# Patient Record
Sex: Male | Born: 1985 | Race: Black or African American | Hispanic: No | Marital: Married | Smoking: Former smoker
Health system: Southern US, Community
[De-identification: ages and names within clinical notes are randomized; demographics above are authoritative.]

## PROBLEM LIST (undated history)

## (undated) DIAGNOSIS — Z9189 Other specified personal risk factors, not elsewhere classified: Secondary | ICD-10-CM

## (undated) DIAGNOSIS — M7542 Impingement syndrome of left shoulder: Secondary | ICD-10-CM

## (undated) DIAGNOSIS — R404 Transient alteration of awareness: Secondary | ICD-10-CM

## (undated) DIAGNOSIS — G4730 Sleep apnea, unspecified: Secondary | ICD-10-CM

## (undated) DIAGNOSIS — F419 Anxiety disorder, unspecified: Secondary | ICD-10-CM

## (undated) DIAGNOSIS — Z6841 Body Mass Index (BMI) 40.0 and over, adult: Secondary | ICD-10-CM

## (undated) HISTORY — PX: TONSILLECTOMY: SUR1361

## (undated) HISTORY — DX: Transient alteration of awareness: R40.4

## (undated) HISTORY — DX: Body Mass Index (BMI) 40.0 and over, adult: Z684

## (undated) HISTORY — DX: Other specified personal risk factors, not elsewhere classified: Z91.89

## (undated) HISTORY — DX: Morbid (severe) obesity due to excess calories: E66.01

## (undated) HISTORY — PX: SHOULDER ARTHROSCOPY W/ SUBACROMIAL DECOMPRESSION AND DISTAL CLAVICLE EXCISION: SHX2401

## (undated) HISTORY — DX: Sleep apnea, unspecified: G47.30

## (undated) HISTORY — PX: WISDOM TOOTH EXTRACTION: SHX21

---

## 2004-01-06 ENCOUNTER — Emergency Department: Payer: Self-pay | Admitting: Emergency Medicine

## 2004-03-26 ENCOUNTER — Emergency Department: Payer: Self-pay | Admitting: Emergency Medicine

## 2004-04-25 ENCOUNTER — Emergency Department: Payer: Self-pay | Admitting: Emergency Medicine

## 2006-05-07 ENCOUNTER — Emergency Department: Payer: Self-pay | Admitting: Unknown Physician Specialty

## 2006-07-28 ENCOUNTER — Ambulatory Visit: Payer: Self-pay | Admitting: Family Medicine

## 2006-07-28 DIAGNOSIS — Z9189 Other specified personal risk factors, not elsewhere classified: Secondary | ICD-10-CM

## 2006-07-28 DIAGNOSIS — E66812 Obesity, class 2: Secondary | ICD-10-CM | POA: Insufficient documentation

## 2006-08-29 ENCOUNTER — Ambulatory Visit: Payer: Self-pay | Admitting: Family Medicine

## 2006-10-14 ENCOUNTER — Ambulatory Visit: Payer: Self-pay | Admitting: Unknown Physician Specialty

## 2007-06-15 ENCOUNTER — Ambulatory Visit: Payer: Self-pay | Admitting: Family Medicine

## 2007-12-28 ENCOUNTER — Ambulatory Visit: Payer: Self-pay | Admitting: Family Medicine

## 2007-12-28 LAB — CONVERTED CEMR LAB: Rapid Strep: NEGATIVE

## 2007-12-29 LAB — CONVERTED CEMR LAB
Basophils Relative: 0.6 % (ref 0.0–3.0)
Chloride: 103 meq/L (ref 96–112)
Eosinophils Relative: 4.5 % (ref 0.0–5.0)
Hemoglobin: 15.9 g/dL (ref 13.0–17.0)
Lymphocytes Relative: 36.5 % (ref 12.0–46.0)
MCHC: 33.8 g/dL (ref 30.0–36.0)
Monocytes Absolute: 0.4 10*3/uL (ref 0.1–1.0)
Monocytes Relative: 5.5 % (ref 3.0–12.0)
Neutrophils Relative %: 52.9 % (ref 43.0–77.0)
Platelets: 383 10*3/uL (ref 150–400)
Potassium: 4.3 meq/L (ref 3.5–5.1)
RDW: 12.4 % (ref 11.5–14.6)
Sodium: 141 meq/L (ref 135–145)

## 2008-08-18 ENCOUNTER — Emergency Department: Payer: Self-pay | Admitting: Emergency Medicine

## 2008-10-23 ENCOUNTER — Ambulatory Visit: Payer: Self-pay | Admitting: Family Medicine

## 2008-10-23 DIAGNOSIS — M7660 Achilles tendinitis, unspecified leg: Secondary | ICD-10-CM | POA: Insufficient documentation

## 2008-11-20 ENCOUNTER — Ambulatory Visit: Payer: Self-pay | Admitting: Family Medicine

## 2008-11-20 DIAGNOSIS — R404 Transient alteration of awareness: Secondary | ICD-10-CM

## 2008-11-26 ENCOUNTER — Ambulatory Visit: Payer: Self-pay | Admitting: Pulmonary Disease

## 2008-11-26 DIAGNOSIS — Z9189 Other specified personal risk factors, not elsewhere classified: Secondary | ICD-10-CM | POA: Insufficient documentation

## 2008-11-26 LAB — CONVERTED CEMR LAB
Albumin: 4.3 g/dL (ref 3.5–5.2)
Alkaline Phosphatase: 61 units/L (ref 39–117)
BUN: 8 mg/dL (ref 6–23)
Basophils Relative: 0.1 % (ref 0.0–3.0)
CO2: 30 meq/L (ref 19–32)
Calcium: 9.6 mg/dL (ref 8.4–10.5)
Chloride: 103 meq/L (ref 96–112)
Cholesterol: 222 mg/dL — ABNORMAL HIGH (ref 0–200)
Eosinophils Absolute: 0.1 10*3/uL (ref 0.0–0.7)
GFR calc non Af Amer: 105.16 mL/min (ref >60)
Glucose, Bld: 75 mg/dL (ref 70–99)
Hemoglobin: 15.8 g/dL (ref 13.0–17.0)
Lymphocytes Relative: 38 % (ref 12.0–46.0)
MCHC: 33.1 g/dL (ref 30.0–36.0)
MCV: 86.7 fL (ref 78.0–100.0)
Monocytes Absolute: 0.5 10*3/uL (ref 0.1–1.0)
Neutrophils Relative %: 52.1 % (ref 43.0–77.0)
Platelets: 396 10*3/uL (ref 150.0–400.0)
Total Bilirubin: 1.3 mg/dL — ABNORMAL HIGH (ref 0.3–1.2)
WBC: 6.7 10*3/uL (ref 4.5–10.5)

## 2008-12-01 DIAGNOSIS — G4733 Obstructive sleep apnea (adult) (pediatric): Secondary | ICD-10-CM

## 2008-12-01 DIAGNOSIS — Z9989 Dependence on other enabling machines and devices: Secondary | ICD-10-CM

## 2008-12-21 ENCOUNTER — Ambulatory Visit (HOSPITAL_BASED_OUTPATIENT_CLINIC_OR_DEPARTMENT_OTHER): Admission: RE | Admit: 2008-12-21 | Discharge: 2008-12-21 | Payer: Self-pay | Admitting: Pulmonary Disease

## 2008-12-21 ENCOUNTER — Encounter: Payer: Self-pay | Admitting: Pulmonary Disease

## 2008-12-25 ENCOUNTER — Ambulatory Visit: Payer: Self-pay | Admitting: Pulmonary Disease

## 2009-01-06 ENCOUNTER — Encounter: Payer: Self-pay | Admitting: Pulmonary Disease

## 2009-01-20 ENCOUNTER — Encounter: Admission: RE | Admit: 2009-01-20 | Discharge: 2009-01-20 | Payer: Self-pay | Admitting: Family Medicine

## 2009-01-20 ENCOUNTER — Ambulatory Visit: Payer: Self-pay | Admitting: Family Medicine

## 2009-01-21 ENCOUNTER — Encounter: Payer: Self-pay | Admitting: Pulmonary Disease

## 2009-02-16 ENCOUNTER — Encounter: Payer: Self-pay | Admitting: Pulmonary Disease

## 2009-02-25 ENCOUNTER — Encounter (INDEPENDENT_AMBULATORY_CARE_PROVIDER_SITE_OTHER): Payer: Self-pay | Admitting: *Deleted

## 2009-03-03 ENCOUNTER — Ambulatory Visit: Payer: Self-pay | Admitting: Pulmonary Disease

## 2010-08-12 ENCOUNTER — Encounter: Payer: Self-pay | Admitting: Family Medicine

## 2010-08-13 ENCOUNTER — Ambulatory Visit (INDEPENDENT_AMBULATORY_CARE_PROVIDER_SITE_OTHER): Payer: BC Managed Care – PPO | Admitting: Family Medicine

## 2010-08-13 ENCOUNTER — Encounter: Payer: Self-pay | Admitting: Family Medicine

## 2010-08-13 VITALS — BP 136/96 | HR 76 | Temp 97.9°F | Ht 71.5 in | Wt 321.1 lb

## 2010-08-13 DIAGNOSIS — K5900 Constipation, unspecified: Secondary | ICD-10-CM

## 2010-08-13 DIAGNOSIS — K602 Anal fissure, unspecified: Secondary | ICD-10-CM | POA: Insufficient documentation

## 2010-08-13 MED ORDER — POLYETHYLENE GLYCOL 3350 17 GM/SCOOP PO POWD: 17.0000 g | Freq: Every day | ORAL | Status: DC

## 2010-08-13 MED ORDER — NITROGLYCERIN 0.4 % RE OINT: 0.5000 [in_us] | TOPICAL_OINTMENT | Freq: Two times a day (BID) | RECTAL | Status: DC | PRN

## 2010-08-13 NOTE — Assessment & Plan Note (Signed)
See pt instructions. Attributing abd pain/bloating to constipation.

## 2010-08-13 NOTE — Assessment & Plan Note (Addendum)
R sided anal fissure.  Minimal hemorrhoid. Discussed increased fiber in diet, increased water. Stop laxative, start miralax and docusate. Will treat with nitro cream as well to expedite healing - discussed risk of hypotension and HA, to use sparingly. See pt instructions for treatment.  Return if not improving as expected. No hx or evidence of crohn's.

## 2010-08-13 NOTE — Patient Instructions (Signed)
Fiber handout provided today. Increase amount of water you are drinking. Soak in warm water bath. miralax daily for goal of one soft stool daily. May use colace daily as well to avoid constipation.  Anal Fissure An anal fissure often begins with sharp pain. This is usually following a bowel movement. It often causes bright red blood stained stools. It is the most common cause of rectal bleeding. One common cause of this is passage of a large, hard stool. It can also be caused by having frequent diarrheal stools. Anal fissures that occur for a longtime (chronic) may require surgery. CAUSES  Passing large, hard stools.   Frequent diarrheal stools.   Constipation.  SYMPTOMS  Bright red, blood stained stools.   Rectal bleeding.  HOME CARE INSTRUCTIONS  If constipation is the cause of the rectal fissure, it may be necessary to add a bulk-forming laxative. A diet high in fruits, whole grains, and vegetables will also help.   Taking hot sitz baths for 1 half hour 4 times per day may help.   Increase your fluid intake.   Only take over-the-counter or prescription medicines for pain, discomfort, or fever as directed by your caregiver. Do not take aspirin as this may increase the tendency for bleeding.   Do not use ointments containing anesthetic medications or hydrocortisone. They could slow healing.   Avoid constipating foods such as bananas and cheese.   In children, brown Karo syrup may be used by adding 1 teaspoon of syrup to 8 ounces of formula. An alternative is to give 3 teaspoons of mineral oil every day.  SEEK MEDICAL CARE IF: Rectal bleeding continues, changes in intensity, or becomes more severe. MAKE SURE YOU:   Understand these instructions.   Will watch your condition.   Will get help right away if you are not doing well or get worse.  Document Released: 03/01/2005 Document Re-Released: 05/26/2009 [facility] Patient Information 2011 [place], [place].

## 2010-08-13 NOTE — Progress Notes (Signed)
  Subjective:    Patient ID: Marguerite Jarboe, male    DOB: [birth year], 25 y.o.   MRN: [id number]  HPI CC: abd pain  Sallyanne Havers chicken last Tuesday at [employer].  Next day constipation (normally BM 2-3x/day.)  Tried laxative - normal stool.  Next day with painful hemorrhoid.  Also having upper and lower abd pain, attributed to indigestion.  Monday with blood in stool, using tucks.  Hasn't had good stool.  Pain with sitting on hemorrhoid.  + constipation.  Has BM, not completely emptying.  Stated prep H suppository yesterday.  No more blood in stool (only when wiping).  No fevers/chills, nausea, vomiting.  No diarrhea.  Has had hemorrhoids in past, last 2010, resolved with preparation H.  Low fiber diet, not drinking much water.  Review of Systems Per HPI    Objective:   Physical Exam  Nursing note and vitals reviewed. Constitutional: He appears well-developed and well-nourished. No distress.  HENT:  Head: Normocephalic and atraumatic.  Mouth/Throat: Oropharynx is clear and moist. No oropharyngeal exudate.  Cardiovascular: Normal rate, regular rhythm, normal heart sounds and intact distal pulses.   No murmur heard. Pulmonary/Chest: Effort normal and breath sounds normal. No respiratory distress. He has no wheezes. He has no rales.  Abdominal: Soft. Bowel sounds are normal. He exhibits no distension. There is tenderness (mild suprapubic). There is no rebound and no guarding.  Genitourinary:     Skin: Skin is warm and dry. No rash noted.          Assessment & Plan:

## 2010-08-14 ENCOUNTER — Telehealth: Payer: Self-pay | Admitting: *Deleted

## 2010-08-14 NOTE — Telephone Encounter (Signed)
[facility] notified as instructed by telephone. Med list updated.

## 2010-08-14 NOTE — Telephone Encounter (Signed)
Received faxed refill request from pharmacy stating that Rectiv 4% ointment is $363.  They would like to change to Nitrobid 2% for $9.  Please advise.

## 2010-08-14 NOTE — Telephone Encounter (Signed)
That is just fine -- same instructions- please change on med list Dr Reece Agar px it -- he is with me and saw the phone note and agrees with the plan thanks

## 2010-08-17 ENCOUNTER — Telehealth: Payer: Self-pay | Admitting: *Deleted

## 2010-08-17 ENCOUNTER — Other Ambulatory Visit: Payer: Self-pay | Admitting: *Deleted

## 2010-08-17 NOTE — Telephone Encounter (Signed)
Rectiv ointment too expensive. Per pharmacy it is $363.00. It can be changed to Nitrobid 2% and it will only be $9.00. Please advise

## 2010-08-17 NOTE — Telephone Encounter (Signed)
Rectiv ointment is too expensive. Per pharmacy it is $363.00. It can be changed to Nitrobid 2 % and will only be $9.00. Please advise.

## 2010-08-17 NOTE — Telephone Encounter (Signed)
Please see previous phone note.  Should have been called in on Friday.  Notify pt.  Ok to change.

## 2010-08-17 NOTE — Telephone Encounter (Signed)
Verified with pharmacy. They did receive change and patient aware.

## 2010-10-06 ENCOUNTER — Encounter: Payer: Self-pay | Admitting: Family Medicine

## 2010-10-06 ENCOUNTER — Ambulatory Visit (INDEPENDENT_AMBULATORY_CARE_PROVIDER_SITE_OTHER): Payer: BC Managed Care – PPO | Admitting: Family Medicine

## 2010-10-06 VITALS — BP 150/88 | HR 80 | Temp 99.1°F | Wt 324.0 lb

## 2010-10-06 DIAGNOSIS — J069 Acute upper respiratory infection, unspecified: Secondary | ICD-10-CM | POA: Insufficient documentation

## 2010-10-06 DIAGNOSIS — J029 Acute pharyngitis, unspecified: Secondary | ICD-10-CM

## 2010-10-06 LAB — POCT RAPID STREP A (OFFICE): Rapid Strep A Screen: NEGATIVE

## 2010-10-06 NOTE — Progress Notes (Signed)
  Subjective:    Patient ID: Jorge Stevens, male    DOB: [birth year], 25 y.o.   MRN: [id number]  HPI CC: ST  7d h/o feeling ill.  Started with sinus congestion, ST.  Cough intermittently, productive of phlegm.  Mild HA, improving.  No ear pain, tooth pain, fevers/chills, nausea/vomiting, abd pain.  No RN, sneezing, itchy eyes.  Taking alleve which helps throat, cough drops.  Taking theraflu sore throat which helped some.  Using CPAP, recently snoring worse, last night had trouble breathing.  + sick contacts recently, no smokers at home.  No h/o asthma, allergies.    H/o tonsils removed.  Thinks uvula swollen.  Review of Systems Per HPI    Objective:   Physical Exam  Nursing note and vitals reviewed. Constitutional: He appears well-developed and well-nourished. No distress.  HENT:  Head: Normocephalic and atraumatic.  Right Ear: Hearing, tympanic membrane, external ear and ear canal normal.  Left Ear: Hearing, tympanic membrane, external ear and ear canal normal.  Nose: Mucosal edema present. No rhinorrhea. Right sinus exhibits no maxillary sinus tenderness and no frontal sinus tenderness. Left sinus exhibits no maxillary sinus tenderness and no frontal sinus tenderness.  Mouth/Throat: Uvula is midline and mucous membranes are normal. Posterior oropharyngeal erythema present. No oropharyngeal exudate, posterior oropharyngeal edema or tonsillar abscesses.  Eyes: Conjunctivae and EOM are normal. Pupils are equal, round, and reactive to light. No scleral icterus.  Neck: Normal range of motion. Neck supple.  Cardiovascular: Normal rate, regular rhythm, normal heart sounds and intact distal pulses.   No murmur heard. Pulmonary/Chest: Effort normal and breath sounds normal. No respiratory distress. He has no wheezes. He has no rales.  Lymphadenopathy:    He has no cervical adenopathy.  Skin: Skin is warm and dry. No rash noted.  Psychiatric: He has a normal mood and affect.           Assessment & Plan:

## 2010-10-06 NOTE — Patient Instructions (Signed)
Sounds like you have a viral upper respiratory infection. Antibiotics are not needed for this.  Viral infections usually take 7-10 days to resolve.  The cough can last several weeks to go away. Push fluids and plenty of rest. Nasal saline or neti pot to help drain sinuses. If congestion feels stuck, may use simple mucinex with plenty of fluid to mobilize mucous out. Ibuprofen/advil for inflammation. Please return if you are not improving as expected, or if you have high fevers (>101.5) or difficulty swallowing or worsening productive cough. Call clinic with questions.  Good to see you today. Follow up with Dr. Milinda Antis for physical to check on blood pressure.

## 2010-10-06 NOTE — Assessment & Plan Note (Signed)
Low centor criteria. RST neg. Viral infection, treat supportively as per instructions. Red flags to return discussed. No cough syrup as h/o OSA.

## 2010-12-04 ENCOUNTER — Emergency Department: Payer: Self-pay | Admitting: Emergency Medicine

## 2010-12-07 ENCOUNTER — Telehealth: Payer: Self-pay | Admitting: Family Medicine

## 2010-12-07 DIAGNOSIS — Z0000 Encounter for general adult medical examination without abnormal findings: Secondary | ICD-10-CM

## 2010-12-07 NOTE — Telephone Encounter (Signed)
Message copied by Judy Pimple on Mon Dec 07, 2010  8:58 PM ------      Message from: Baldomero Lamy      Created: Mon Dec 07, 2010 10:17 AM      Regarding: cpx labs Wed 9/26       Please order  future cpx labs for pt's upcomming lab appt.      Thanks      Rodney Booze

## 2010-12-09 ENCOUNTER — Other Ambulatory Visit: Payer: BC Managed Care – PPO

## 2010-12-15 ENCOUNTER — Other Ambulatory Visit (INDEPENDENT_AMBULATORY_CARE_PROVIDER_SITE_OTHER): Payer: BC Managed Care – PPO

## 2010-12-15 DIAGNOSIS — Z0000 Encounter for general adult medical examination without abnormal findings: Secondary | ICD-10-CM

## 2010-12-15 LAB — CBC WITH DIFFERENTIAL/PLATELET
Lymphs Abs: 2.3 10*3/uL (ref 0.7–4.0)
MCHC: 33 g/dL (ref 30.0–36.0)
Monocytes Absolute: 0.4 10*3/uL (ref 0.1–1.0)
Monocytes Relative: 6.4 % (ref 3.0–12.0)
Neutrophils Relative %: 56.1 % (ref 43.0–77.0)
Platelets: 464 10*3/uL — ABNORMAL HIGH (ref 150.0–400.0)
RBC: 5.28 Mil/uL (ref 4.22–5.81)
RDW: 13.3 % (ref 11.5–14.6)
WBC: 6.6 10*3/uL (ref 4.5–10.5)

## 2010-12-15 LAB — LIPID PANEL
Cholesterol: 185 mg/dL (ref 0–200)
HDL: 39.4 mg/dL (ref >39.00)

## 2010-12-15 LAB — COMPREHENSIVE METABOLIC PANEL
AST: 25 U/L (ref 0–37)
CO2: 27 mEq/L (ref 19–32)
Calcium: 9 mg/dL (ref 8.4–10.5)
GFR: 107.98 mL/min (ref >60.00)
Potassium: 3.7 mEq/L (ref 3.5–5.1)
Sodium: 140 mEq/L (ref 135–145)
Total Bilirubin: 1.2 mg/dL (ref 0.3–1.2)

## 2010-12-15 LAB — TSH: TSH: 0.82 u[IU]/mL (ref 0.35–5.50)

## 2010-12-16 ENCOUNTER — Ambulatory Visit (INDEPENDENT_AMBULATORY_CARE_PROVIDER_SITE_OTHER): Payer: BC Managed Care – PPO | Admitting: Family Medicine

## 2010-12-16 ENCOUNTER — Encounter: Payer: Self-pay | Admitting: Family Medicine

## 2010-12-16 VITALS — BP 142/70 | HR 84 | Temp 98.1°F | Ht 71.5 in | Wt 327.2 lb

## 2010-12-16 DIAGNOSIS — I10 Essential (primary) hypertension: Secondary | ICD-10-CM | POA: Insufficient documentation

## 2010-12-16 DIAGNOSIS — Z0000 Encounter for general adult medical examination without abnormal findings: Secondary | ICD-10-CM

## 2010-12-16 DIAGNOSIS — G4730 Sleep apnea, unspecified: Secondary | ICD-10-CM

## 2010-12-16 DIAGNOSIS — R030 Elevated blood-pressure reading, without diagnosis of hypertension: Secondary | ICD-10-CM

## 2010-12-16 DIAGNOSIS — Z23 Encounter for immunization: Secondary | ICD-10-CM

## 2010-12-16 DIAGNOSIS — K5900 Constipation, unspecified: Secondary | ICD-10-CM

## 2010-12-16 NOTE — Assessment & Plan Note (Signed)
Overall doing well with cpap- and compliant with that

## 2010-12-16 NOTE — Assessment & Plan Note (Addendum)
Elevated systolic today - not as high as last time  Stressed imp of getting back to regular exercise and working on wt loss Put together a plan- will consider wt watchers on line  F/u 6 mo  If not imp consider tx

## 2010-12-16 NOTE — Assessment & Plan Note (Signed)
Reviewed health habits including diet and exercise and skin cancer prevention Also reviewed health mt list, fam hx and immunizations  Rev wellness labs - commended on better chol with imp diet Flu shot today

## 2010-12-16 NOTE — Assessment & Plan Note (Signed)
Better controlled with diet/ miralax when needed No reocurrance of anal fissure

## 2010-12-16 NOTE — Progress Notes (Signed)
Subjective:    Patient ID: Jorge Stevens, male    DOB: 02-14-1986, 25 y.o.   MRN: [id number]  HPI Here for annual health mt exam and to review chronic med problems and also disc bp  Is feeling ok overall  Nothing new medically   bp is a bit high systolic with 142/70 (much higher last time 150/88)  Has high bp in family  No cp or ha  Has sleep apnea on cpap-  Is still using it  Makes him feel much better    Flu shot wants to get today Td 2010  Wt is up 3 lb with bmi of 45 Knows wt loss is imp to health  Lipids have improved with LDL 119 and dec trig Lab Results  Component Value Date   CHOL 185 12/15/2010   CHOL 222* 11/20/2008   Lab Results  Component Value Date   HDL 39.40 12/15/2010   HDL 91.47* 11/20/2008   Lab Results  Component Value Date   LDLCALC 119* 12/15/2010   Lab Results  Component Value Date   TRIG 134.0 12/15/2010   TRIG 223.0* 11/20/2008   Lab Results  Component Value Date   CHOLHDL 5 12/15/2010   CHOLHDL 7 11/20/2008   Lab Results  Component Value Date   LDLDIRECT 167.8 11/20/2008   HDL is almost 40  Diet - has become a lot better Eating out much less and much less fried foods  Getting away from fast food too   Activity was exercising for a while - then got sick and got off track with it  Was doing cardio / weights - and feels ready to get back to it  Has hungrier days than others   Working from home - for [employer]   Patient Active Problem List  Diagnoses  . OBESITY  . ACHILLES TENDINITIS  . OBSTRUCTIVE SLEEP APNEA  . SNORING, HX OF  . WISDOM TEETH EXTRACTION, HX OF  . Anal fissure  . Constipation  . Routine general medical examination at a health care facility  . Elevated blood pressure (not hypertension)   Past Medical History  Diagnosis Date  . Obesity   . Achilles bursitis or tendinitis   . Unspecified constipation   . Unspecified sleep apnea   . Other specified personal history presenting hazards to health   . Other  alteration of consciousness    Past Surgical History  Procedure Date  . Wisdom tooth extraction   . Tonsillectomy   . Shoulder surgery     Left   History  Substance Use Topics  . Smoking status: Former Smoker    Quit date: 03/16/2007  . Smokeless tobacco: Not on file  . Alcohol Use: Yes     Occasional   Family History  Problem Relation Age of Onset  . Hyperlipidemia Father   . Hyperlipidemia Mother   . Diabetes      grandparents  . Throat cancer      grandmother  . Lung cancer      grandfather   No Known Allergies Current Outpatient Prescriptions on File Prior to Visit  Medication Sig Dispense Refill  . ibuprofen (ADVIL,MOTRIN) 200 MG tablet Take 200 mg by mouth every 6 (six) hours as needed.        . NON FORMULARY CPAP 12 cm, full face mask       . bismuth subsalicylate (PEPTO BISMOL) 262 MG/15ML suspension Take 15 mLs by mouth every 6 (six) hours as needed.        Marland Kitchen  nitroGLYCERIN (NITRO-BID) 2 % ointment Place 0.5 inches rectally 2 (two) times daily as needed. Small ribbon      . phenylephrine-shark liver oil-mineral oil-petrolatum (PREPARATION H) 0.25-3-14-71.9 % rectal ointment Place 1 application rectally 2 (two) times daily as needed.        . polyethylene glycol powder (GLYCOLAX/MIRALAX) powder Take 17 g by mouth daily. Mixed in 8 oz fliud  527 g  0  . simethicone (MYLICON) 125 MG chewable tablet Chew 125 mg by mouth every 6 (six) hours as needed.               Review of Systems Review of Systems  Constitutional: Negative for fever, appetite change, fatigue and unexpected weight change.  Eyes: Negative for pain and visual disturbance.  Respiratory: Negative for cough and shortness of breath.   Cardiovascular: Negative for cp or palpitations    Gastrointestinal: Negative for nausea, diarrhea and constipation.  Genitourinary: Negative for urgency and frequency.  Skin: Negative for pallor or rash   Neurological: Negative for weakness, light-headedness,  numbness and headaches.  Hematological: Negative for adenopathy. Does not bruise/bleed easily.  Psychiatric/Behavioral: Negative for dysphoric mood. The patient is not nervous/anxious.          Objective:   Physical Exam  Constitutional: He appears well-developed and well-nourished. No distress.       Obese and well appearing  HENT:  Head: Normocephalic and atraumatic.  Right Ear: External ear normal.  Left Ear: External ear normal.  Nose: Nose normal.  Mouth/Throat: Oropharynx is clear and moist.  Eyes: Conjunctivae and EOM are normal. Pupils are equal, round, and reactive to light. No scleral icterus.  Neck: Normal range of motion. Neck supple. No JVD present. Carotid bruit is not present. No thyromegaly present.  Cardiovascular: Normal rate, regular rhythm, normal heart sounds and intact distal pulses.  Exam reveals no gallop.   Pulmonary/Chest: Effort normal and breath sounds normal. No respiratory distress. He has no wheezes. He exhibits no tenderness.  Abdominal: Soft. Bowel sounds are normal. He exhibits no distension, no abdominal bruit and no mass. There is no tenderness.  Musculoskeletal: Normal range of motion. He exhibits no edema and no tenderness.  Lymphadenopathy:    He has no cervical adenopathy.  Neurological: He is alert. He has normal reflexes. No cranial nerve deficit. He exhibits normal muscle tone. Coordination normal.       No tremor   Skin: Skin is warm and dry. No rash noted. No erythema. No pallor.  Psychiatric: He has a normal mood and affect.          Assessment & Plan:

## 2010-12-16 NOTE — Patient Instructions (Signed)
Keep working on [employer] - avoid junk food and watch portions Consider weight watchers for men  Flu shot today Exercise - aim for 5 days per week for at least 30 minutes when you are feeling better  Follow up in 6 months for visit and will look at your blood pressure again

## 2011-06-02 ENCOUNTER — Institutional Professional Consult (permissible substitution): Payer: BC Managed Care – PPO | Admitting: Family Medicine

## 2011-06-02 DIAGNOSIS — Z0289 Encounter for other administrative examinations: Secondary | ICD-10-CM

## 2011-06-15 ENCOUNTER — Ambulatory Visit: Payer: BC Managed Care – PPO | Admitting: Family Medicine

## 2011-06-15 DIAGNOSIS — Z0289 Encounter for other administrative examinations: Secondary | ICD-10-CM

## 2011-07-07 ENCOUNTER — Ambulatory Visit: Payer: BC Managed Care – PPO | Admitting: Family Medicine

## 2011-07-14 ENCOUNTER — Encounter: Payer: Self-pay | Admitting: Family Medicine

## 2011-07-14 ENCOUNTER — Ambulatory Visit (INDEPENDENT_AMBULATORY_CARE_PROVIDER_SITE_OTHER): Payer: 59 | Admitting: Family Medicine

## 2011-07-14 VITALS — BP 134/90 | HR 87 | Temp 98.0°F | Ht 71.5 in | Wt 327.8 lb

## 2011-07-14 DIAGNOSIS — J069 Acute upper respiratory infection, unspecified: Secondary | ICD-10-CM | POA: Insufficient documentation

## 2011-07-14 DIAGNOSIS — E669 Obesity, unspecified: Secondary | ICD-10-CM

## 2011-07-14 DIAGNOSIS — G4730 Sleep apnea, unspecified: Secondary | ICD-10-CM

## 2011-07-14 DIAGNOSIS — R030 Elevated blood-pressure reading, without diagnosis of hypertension: Secondary | ICD-10-CM

## 2011-07-14 MED ORDER — AMLODIPINE BESYLATE 5 MG PO TABS: 5.0000 mg | ORAL_TABLET | Freq: Every day | ORAL | Status: DC

## 2011-07-14 NOTE — Assessment & Plan Note (Signed)
No imp with lifestyle change Understands he needs to loose wt  Start amlodipine 5 Disc poss side eff F/u 6-8 wk

## 2011-07-14 NOTE — Assessment & Plan Note (Signed)
With ongoing cong and cough- s/p 2 abx from UC  Got better less than a week then worse Some mild sinus tenderness Will call for UC notes ? New uri or sinusitis Pt claims no hx of allergies  Disc symptomatic care - see instructions on AVS

## 2011-07-14 NOTE — Assessment & Plan Note (Signed)
cpap has helped - but snoring again so will call and make his own appt for f/u with pulm

## 2011-07-14 NOTE — Assessment & Plan Note (Signed)
Discussed how this problem influences overall health and the risks it imposes  Reviewed plan for weight loss with lower calorie diet (via better food choices and also portion control or program like weight watchers) and exercise building up to or more than 30 minutes 5 days per week including some aerobic activity   Good effort so far- need to cut portions

## 2011-07-14 NOTE — Progress Notes (Signed)
Subjective:    Patient ID: Jorge Stevens, male    DOB: 1985/06/21, 26 y.o.   MRN: [id number]  HPI Here for f/u of chronic conditions Taking better care of himself  Also uri   Wt is stable  bmi is 45 Diet- better - no more fried or fatty foods  Exercise-- does walking / cardio and walking -- at least 2 times per week  Cut back when you were sick -- has been to urgent care twice [facility] -- has been coming back - ST   [facility]- brown/ yellow d/c (former smoker)  ST  No fever - some sweats  Pain in face around eyes  Coughing - took ny quil  Worse in ams - and sleeping with mouth open - makes cpap difficult  Finished last abs 4-5 d ago  Also was on prednisone   Hx of elevated bp - not checking at home  BP Readings from Last 3 Encounters:  07/14/11 134/90  12/16/10 142/70  10/06/10 150/88      Chemistry      Component Value Date/Time   NA 140 12/15/2010 0859   K 3.7 12/15/2010 0859   CL 106 12/15/2010 0859   CO2 27 12/15/2010 0859   BUN 9 12/15/2010 0859   CREATININE 1.1 12/15/2010 0859      Component Value Date/Time   CALCIUM 9.0 12/15/2010 0859   ALKPHOS 49 12/15/2010 0859   AST 25 12/15/2010 0859   ALT 48 12/15/2010 0859   BILITOT 1.2 12/15/2010 0859      Sleep apnea -cpap has been working , but was snoring again- needs to follow up  Needs phone number   Patient Active Problem List  Diagnoses  . OBESITY  . OBSTRUCTIVE SLEEP APNEA  . SNORING, HX OF  . WISDOM TEETH EXTRACTION, HX OF  . Anal fissure  . Constipation  . Routine general medical examination at a health care facility  . Hypertension, essential  . Acute URI   Past Medical History  Diagnosis Date  . Obesity   . Achilles bursitis or tendinitis   . Unspecified constipation   . Unspecified sleep apnea   . Other specified personal history presenting hazards to health   . Other alteration of consciousness    Past Surgical History  Procedure Date  . Wisdom tooth extraction   . Tonsillectomy   . Shoulder  surgery     Left   History  Substance Use Topics  . Smoking status: Former Smoker    Quit date: 03/16/2007  . Smokeless tobacco: Not on file  . Alcohol Use: Yes     Occasional   Family History  Problem Relation Age of Onset  . Hyperlipidemia Father   . Hyperlipidemia Mother   . Diabetes      grandparents  . Throat cancer      grandmother  . Lung cancer      grandfather   No Known Allergies Current Outpatient Prescriptions on File Prior to Visit  Medication Sig Dispense Refill  . ibuprofen (ADVIL,MOTRIN) 200 MG tablet Take 200 mg by mouth every 6 (six) hours as needed.        . NON FORMULARY CPAP 12 cm, full face mask       . amLODipine (NORVASC) 5 MG tablet Take 1 tablet (5 mg total) by mouth daily.  30 tablet  3  . phenylephrine-shark liver oil-mineral oil-petrolatum (PREPARATION H) 0.25-3-14-71.9 % rectal ointment Place 1 application rectally 2 (two) times daily as needed.  Review of Systems Review of Systems  Constitutional: Negative for fever, appetite change,  and unexpected weight change.  Eyes: Negative for pain and visual disturbance.  ENT pos for congestion/ rhinorrhea/ yellow discharge/ st Respiratory: Negative for sob and wheeze/ pos for cough Cardiovascular: Negative for cp or palpitations    Gastrointestinal: Negative for nausea, diarrhea and constipation.  Genitourinary: Negative for urgency and frequency.  Skin: Negative for pallor or rash   Neurological: Negative for weakness, light-headedness, numbness and headaches.  Hematological: Negative for adenopathy. Does not bruise/bleed easily.  Psychiatric/Behavioral: Negative for dysphoric mood. The patient is not nervous/anxious.         Objective:   Physical Exam  Constitutional: He appears well-developed and well-nourished. No distress.       Obese and well appearing   HENT:  Head: Normocephalic and atraumatic.  Right Ear: External ear normal.  Left Ear: External ear normal.    Mouth/Throat: Oropharynx is clear and moist. No oropharyngeal exudate.       Nares are injected and congested   Mild maxillary sinus tenderness Clear rhinorrhea with post nasal drip   Eyes: Conjunctivae and EOM are normal. Pupils are equal, round, and reactive to light. Right eye exhibits no discharge. Left eye exhibits no discharge.  Neck: Normal range of motion. Neck supple. No JVD present. Carotid bruit is not present. No thyromegaly present.  Cardiovascular: Normal rate, regular rhythm, normal heart sounds and intact distal pulses.   Pulmonary/Chest: Effort normal and breath sounds normal. No respiratory distress. He has no wheezes.  Abdominal: Soft. Bowel sounds are normal. He exhibits no distension, no abdominal bruit and no mass. There is no tenderness.  Musculoskeletal: Normal range of motion. He exhibits no edema and no tenderness.  Lymphadenopathy:    He has no cervical adenopathy.  Neurological: He is alert. He has normal reflexes. No cranial nerve deficit. He exhibits normal muscle tone. Coordination normal.  Skin: Skin is warm and dry. No rash noted. No erythema. No pallor.  Psychiatric: He has a normal mood and affect.          Assessment & Plan:

## 2011-07-14 NOTE — Patient Instructions (Addendum)
Take robitussin DM for cough Drink lots of fluids  Use nasal saline spray for congestion Please send for notes from [facility] on [address]- so I can review Please give patient the number to [facility] Make your follow up appt for sleep apnea Keep working hard on weight loss  Start amlodipine 5 mg once daily -- update me if problems or side effects Follow up with me in 6-8 weeks

## 2011-09-01 ENCOUNTER — Ambulatory Visit: Payer: 59 | Admitting: Family Medicine

## 2011-09-01 DIAGNOSIS — Z0289 Encounter for other administrative examinations: Secondary | ICD-10-CM

## 2011-09-28 ENCOUNTER — Ambulatory Visit (INDEPENDENT_AMBULATORY_CARE_PROVIDER_SITE_OTHER): Payer: 59 | Admitting: Pulmonary Disease

## 2011-09-28 ENCOUNTER — Encounter: Payer: Self-pay | Admitting: Pulmonary Disease

## 2011-09-28 VITALS — BP 124/78 | HR 77 | Temp 98.9°F | Ht 73.0 in | Wt 337.0 lb

## 2011-09-28 DIAGNOSIS — G4730 Sleep apnea, unspecified: Secondary | ICD-10-CM

## 2011-09-28 NOTE — Patient Instructions (Signed)
We will check download & decide if pressure needs to be adjusted CPAP supplies- including nasal mask OK to change DME

## 2011-09-28 NOTE — Assessment & Plan Note (Signed)
We will check download & decide if pressure needs to be adjusted CPAP supplies- including nasal mask instead of full face, if needed  - he will try chin strap OK to change DME Weight loss encouraged, compliance with goal of at least 4-6 hrs every night is the expectation. Advised against medications with sedative side effects Cautioned against driving when sleepy - understanding that sleepiness will vary on a day to day basis

## 2011-09-28 NOTE — Progress Notes (Signed)
  Subjective:    Patient ID: Jorge Stevens, male    DOB: 02/20/1986, 26 y.o.   MRN: [id number]  HPI 22/M, morbidly obese with obstructive sleep apnea for post CPAP FU.  Snoring is loud & has not improved after tonsillectomy in 2008. He has always been a night owl since his teenage years, bedtime is way past midnight, nocturia +, wakes up after 11 am feeling unrefreshed with a dry mouth, no morning headaches.  discussed PSG >> mod obstructive sleep apnea >> started CPAP 10 cm, medium quattro mask, goal 12 cm  March 03, 2009  download 10/18- 12/5 >> no residual events, good compliance on 10 cm. >> increased to 12 cm  09/28/2011 Wife reports some snoring on 12 cm, pressure OK, feels rested in am but sleepy again as the days goes by. Wife reports one gasping episode He has gained 10 lbs over last 3 mnths, sedentary life Humidity setting at 3 Full face Mask ok - but wonders if he can use nasal mask  Review of Systems Patient denies significant dyspnea,cough, hemoptysis,  chest pain, palpitations, pedal edema, orthopnea, paroxysmal nocturnal dyspnea, lightheadedness, nausea, vomiting, abdominal or  leg pains      Objective:   Physical Exam  Gen. Pleasant, obese, in no distress ENT - no lesions, no post nasal drip Neck: No JVD, no thyromegaly, no carotid bruits Lungs: no use of accessory muscles, no dullness to percussion, decreased without rales or rhonchi  Cardiovascular: Rhythm regular, heart sounds  normal, no murmurs or gallops, no peripheral edema Musculoskeletal: No deformities, no cyanosis or clubbing , no tremors        Assessment & Plan:

## 2011-11-17 ENCOUNTER — Ambulatory Visit (INDEPENDENT_AMBULATORY_CARE_PROVIDER_SITE_OTHER): Payer: 59 | Admitting: Family Medicine

## 2011-11-17 ENCOUNTER — Encounter: Payer: Self-pay | Admitting: Family Medicine

## 2011-11-17 VITALS — BP 128/76 | HR 83 | Temp 98.4°F | Ht 73.0 in | Wt 338.5 lb

## 2011-11-17 DIAGNOSIS — R519 Headache, unspecified: Secondary | ICD-10-CM | POA: Insufficient documentation

## 2011-11-17 DIAGNOSIS — R51 Headache: Secondary | ICD-10-CM

## 2011-11-17 DIAGNOSIS — I10 Essential (primary) hypertension: Secondary | ICD-10-CM

## 2011-11-17 MED ORDER — AMLODIPINE BESYLATE 10 MG PO TABS: 10.0000 mg | ORAL_TABLET | Freq: Every day | ORAL | Status: DC

## 2011-11-17 NOTE — Assessment & Plan Note (Signed)
bp is fairly controlled, but since ha are returning - will tx more agressively with inc in amlodipine to 10 mg  Update if not starting to improve in a week or if worsening   F/U in 4-6 wk

## 2011-11-17 NOTE — Progress Notes (Signed)
Subjective:    Patient ID: Jorge Stevens, male    DOB: 11-11-1985, 26 y.o.   MRN: [id number]  HPI Here for 2 weeks of headaches  Feels very sharp - like ice pick-- in between his eyes  In the past this occurred when not taking his bp med  (last mo) Now back on the amlodipine for 1 month- and headaches came back   Pain is constant  Had to leave work yesterday  No nausea  Phonophobia - pos  No photophobia  No sinus symptoms or congestion  otc - took some ibuprofen , then later tylenol pm  The nsaid helped some (800 mg at a time)   Not dehydrated  Does not drink alcohol often  No smoking or chewing tab   fam hx - brother has had headache  Uses his cpap every night- working well  Only gets 4-5 hours of sleep a day anyway Diet ok  1 cup coffee in am  Works from home -no exposures  Patient Active Problem List  Diagnosis  . OBESITY  . OBSTRUCTIVE SLEEP APNEA  . SNORING, HX OF  . WISDOM TEETH EXTRACTION, HX OF  . Anal fissure  . Constipation  . Routine general medical examination at a health care facility  . Hypertension, essential  . Acute URI   Past Medical History  Diagnosis Date  . Obesity   . Achilles bursitis or tendinitis   . Unspecified constipation   . Unspecified sleep apnea   . Other specified personal history presenting hazards to health   . Other alteration of consciousness    Past Surgical History  Procedure Date  . Wisdom tooth extraction   . Tonsillectomy   . Shoulder surgery     Left   History  Substance Use Topics  . Smoking status: Former Smoker -- 1.5 packs/day for 6 years    Types: Cigarettes    Quit date: 03/16/2007  . Smokeless tobacco: Not on file  . Alcohol Use: Yes     Occasional   Family History  Problem Relation Age of Onset  . Hyperlipidemia Father   . Hyperlipidemia Mother   . Diabetes      grandparents  . Throat cancer      grandmother  . Lung cancer      grandfather   No Known Allergies Current Outpatient  Prescriptions on File Prior to Visit  Medication Sig Dispense Refill  . amLODipine (NORVASC) 5 MG tablet Take 1 tablet (5 mg total) by mouth daily.  30 tablet  3  . ibuprofen (ADVIL,MOTRIN) 200 MG tablet Take 200 mg by mouth every 6 (six) hours as needed.        . NON FORMULARY CPAP 12 cm, full face mask       . phenylephrine-shark liver oil-mineral oil-petrolatum (PREPARATION H) 0.25-3-14-71.9 % rectal ointment Place 1 application rectally 2 (two) times daily as needed.            Review of Systems Review of Systems  Constitutional: Negative for fever, appetite change, fatigue and unexpected weight change.  Eyes: Negative for pain and visual disturbance.  Respiratory: Negative for cough and shortness of breath.   Cardiovascular: Negative for cp or palpitations    Gastrointestinal: Negative for nausea, diarrhea and constipation.  Genitourinary: Negative for urgency and frequency.  Skin: Negative for pallor or rash   Neurological: Negative for weakness, light-headedness, numbness and pos for headaches.  Hematological: Negative for adenopathy. Does not bruise/bleed easily.  Psychiatric/Behavioral: Negative for  dysphoric mood. The patient is not nervous/anxious.         Objective:   Physical Exam  Constitutional: He appears well-developed and well-nourished. No distress.       obese and well appearing   HENT:  Head: Normocephalic and atraumatic.  Right Ear: External ear normal.  Left Ear: External ear normal.  Nose: Nose normal.  Mouth/Throat: Oropharynx is clear and moist.       No facial tenderness  Eyes: Conjunctivae and EOM are normal. Pupils are equal, round, and reactive to light. Right eye exhibits no discharge. Left eye exhibits no discharge. No scleral icterus.  Neck: Normal range of motion. Neck supple. No thyromegaly present.  Cardiovascular: Normal rate, regular rhythm, normal heart sounds and intact distal pulses.  Exam reveals no gallop.   Pulmonary/Chest: Effort  normal and breath sounds normal. No respiratory distress. He has no wheezes.  Abdominal: Soft. Bowel sounds are normal.  Musculoskeletal: He exhibits no edema and no tenderness.  Lymphadenopathy:    He has no cervical adenopathy.  Neurological: He is alert. He has normal strength and normal reflexes. He displays no atrophy and no tremor. No cranial nerve deficit or sensory deficit. He exhibits normal muscle tone. He displays a negative Romberg sign. Coordination and gait normal.       No focal cerebellar signs  Skin: Skin is warm and dry. No rash noted. No erythema. No pallor.  Psychiatric: He has a normal mood and affect.          Assessment & Plan:

## 2011-11-17 NOTE — Assessment & Plan Note (Signed)
Suspect multifactorial- stress/poor sleep hours / caffeine/ lack of exercise Handout given on lifestyle Also bp played a role in past Inc amlodipine to 10 mg - will call if side eff Update if not starting to improve in a week or if worsening   Reassuring neuro exam today

## 2011-11-17 NOTE — Patient Instructions (Addendum)
Increase your amlodipine to 2 pills once daily (that is 10 mg once daily )  If side effects or problems let me know Try to get more sleep  Work on cutting caffeine Drink lots of fluids  Use ibuprofen when you really need it for head ache  Follow up with me in 4-6 weeks

## 2011-12-15 ENCOUNTER — Ambulatory Visit: Payer: 59 | Admitting: Family Medicine

## 2011-12-15 DIAGNOSIS — Z0289 Encounter for other administrative examinations: Secondary | ICD-10-CM

## 2012-01-13 ENCOUNTER — Ambulatory Visit: Payer: 59 | Admitting: Family Medicine

## 2012-01-13 DIAGNOSIS — Z0289 Encounter for other administrative examinations: Secondary | ICD-10-CM

## 2012-01-20 ENCOUNTER — Ambulatory Visit (INDEPENDENT_AMBULATORY_CARE_PROVIDER_SITE_OTHER): Payer: 59 | Admitting: Family Medicine

## 2012-01-20 ENCOUNTER — Encounter: Payer: Self-pay | Admitting: Family Medicine

## 2012-01-20 VITALS — BP 142/78 | HR 92 | Temp 98.9°F | Wt 338.0 lb

## 2012-01-20 DIAGNOSIS — M25519 Pain in unspecified shoulder: Secondary | ICD-10-CM

## 2012-01-20 MED ORDER — TRAMADOL HCL 50 MG PO TABS: 50.0000 mg | ORAL_TABLET | Freq: Three times a day (TID) | ORAL | Status: DC | PRN

## 2012-01-20 MED ORDER — ACETAMINOPHEN 500 MG PO TABS: 500.0000 mg | ORAL_TABLET | Freq: Three times a day (TID) | ORAL | Status: DC | PRN

## 2012-01-20 MED ORDER — MELOXICAM 15 MG PO TABS: 15.0000 mg | ORAL_TABLET | Freq: Every day | ORAL | Status: DC

## 2012-01-20 NOTE — Assessment & Plan Note (Signed)
Change to mobic/tramadol/tylenol.  Gi and sedation caution.  Likely excess muscle strength on anterior chest pulling scap forward.  Needs to strengthen back, refer to ortho for consideration of injection and/or PT.  Anatomy d/w pt.  Will work on shoulder shrugs and pendulum exercises in meantime.  He understood. Goal to avoid frozen shoulder.  Defer imaging to ortho.

## 2012-01-20 NOTE — Patient Instructions (Addendum)
Work on shoulder shrugs and arms swings as we discussed.  Take tramadol/tylenol/mobic for pain.  See Shirlee Limerick about your referral before you leave today.

## 2012-01-20 NOTE — Progress Notes (Signed)
H/o L shoulder surgery, s/p spur resection ~2006.  Now with L>R shoulder pain.  Worse in the last few weeks. Taking ibuprofen and tylenol w/o relief.  "Hot feeling, jabbing in the joint."  No recent injury.  He started back working out a few months ago, but then he cut back.  Pain with movement above his head, reaching for back pocket.  Grip is still normal.    Was prev doing heavy weight lifting.   Meds, vitals, and allergies reviewed.   ROS: See HPI.  Otherwise, noncontributory.  nad ncat Neck supple, normal ROM Normal ROM R shoulder with minimal impingement sx L shoulder pain on AC testing, int and ext rotation.  Supraspinatus weak on testing.  + impingement.  Distally nv intact scap assist pos on L shoulder

## 2012-03-01 ENCOUNTER — Ambulatory Visit: Payer: 59 | Admitting: Family Medicine

## 2012-03-02 ENCOUNTER — Ambulatory Visit: Payer: 59 | Admitting: Family Medicine

## 2012-03-02 ENCOUNTER — Telehealth: Payer: Self-pay

## 2012-03-02 NOTE — Telephone Encounter (Signed)
Pt has severe S/T with fever and head congestion. Pt has appt today with Dr Para March at 4 pm.

## 2012-03-10 ENCOUNTER — Other Ambulatory Visit: Payer: Self-pay | Admitting: Family Medicine

## 2012-03-13 NOTE — Telephone Encounter (Signed)
Electronic refill request.  Please advise. 

## 2012-03-13 NOTE — Telephone Encounter (Signed)
done

## 2012-03-13 NOTE — Telephone Encounter (Signed)
He can have 3 months of refils, thanks

## 2012-03-21 ENCOUNTER — Telehealth: Payer: Self-pay | Admitting: Pulmonary Disease

## 2012-03-21 DIAGNOSIS — G4733 Obstructive sleep apnea (adult) (pediatric): Secondary | ICD-10-CM

## 2012-03-21 NOTE — Telephone Encounter (Signed)
LMOMTCB

## 2012-03-22 NOTE — Telephone Encounter (Signed)
Pt last seen by RA in July 2013 and states that he never heard anything from [facility] regarding the download. He did receive new supplies and a new mask. Will resend order to [facility] so they can obtain the download on current settings and fax results to RA.  Will forward msg to RA so he is aware.

## 2012-03-27 NOTE — Telephone Encounter (Signed)
ok 

## 2012-04-05 ENCOUNTER — Encounter (HOSPITAL_COMMUNITY): Payer: Self-pay | Admitting: *Deleted

## 2012-04-05 ENCOUNTER — Telehealth: Payer: Self-pay | Admitting: Family Medicine

## 2012-04-05 ENCOUNTER — Emergency Department (INDEPENDENT_AMBULATORY_CARE_PROVIDER_SITE_OTHER)
Admission: EM | Admit: 2012-04-05 | Discharge: 2012-04-05 | Disposition: A | Discharge status: Home / Self Care | Payer: 59 | Source: Home / Self Care | Attending: Emergency Medicine | Admitting: Emergency Medicine

## 2012-04-05 DIAGNOSIS — K5289 Other specified noninfective gastroenteritis and colitis: Secondary | ICD-10-CM

## 2012-04-05 DIAGNOSIS — K529 Noninfective gastroenteritis and colitis, unspecified: Secondary | ICD-10-CM

## 2012-04-05 MED ORDER — ONDANSETRON 4 MG PO TBDP
ORAL_TABLET | ORAL | Status: AC
  Filled 2012-04-05: qty 2

## 2012-04-05 MED ORDER — ORALYTE PO SOLN: ORAL | Status: DC

## 2012-04-05 MED ORDER — DIPHENOXYLATE-ATROPINE 2.5-0.025 MG PO TABS: 1.0000 | ORAL_TABLET | Freq: Four times a day (QID) | ORAL | Status: DC | PRN

## 2012-04-05 MED ORDER — ONDANSETRON HCL 4 MG PO TABS: 4.0000 mg | ORAL_TABLET | Freq: Three times a day (TID) | ORAL | Status: DC | PRN

## 2012-04-05 MED ORDER — ONDANSETRON 4 MG PO TBDP
8.0000 mg | ORAL_TABLET | Freq: Once | ORAL | Status: AC
  Administered 2012-04-05: 8 mg via ORAL

## 2012-04-05 NOTE — Telephone Encounter (Signed)
Patient Information:  Caller Name: Gala Murdoch  Phone: [phone]  Patient: Jorge Stevens, Jorge Stevens  Gender: Male  DOB: 09-25-1985  Age: 27 Years  PCP: [facility] ([facility])  Office Follow Up:  Does the office need to follow up with this patient?: No  Instructions For The Office: N/A  RN Note:  Has symptoms of GI virus with vomiting and diarrhea that began at 2200 04/04/12. Last emesis at 0300.  Continues to have mild diarrhea with 4-5 stools since 2200 04/04/12. Abdominal pain present even after BM. Too late to schedule appointment in office so advised to go to [facility].  Symptoms  Reason For Call & Symptoms: Shaking chills.  Reviewed Health History In EMR: Yes  Reviewed Medications In EMR: Yes  Reviewed Allergies In EMR: Yes  Reviewed Surgeries / Procedures: Yes  Date of Onset of Symptoms: 04/05/2012  Any Fever: Yes  Fever Taken: Forehead  Fever Time Of Reading: 16:40:00  Fever Last Reading: 101.9  Guideline(s) Used:  Diarrhea  Disposition Per Guideline:   See Today in Office  Reason For Disposition Reached:   Abdominal pain (Exception: pain clears completely with each passage of diarrhea stool)  Advice Given:  N/A  RN Overrode Recommendation:  Go To [facility]  Too late for appointment in office.

## 2012-04-05 NOTE — ED Provider Notes (Addendum)
History     CSN: [id number]  Arrival date & time 04/05/12  1736   First MD Initiated Contact with Patient 04/05/12 1754      Chief Complaint  Patient presents with  . Nausea  . Vomiting  . Diarrhea    (Consider location/radiation/quality/duration/timing/severity/associated sxs/prior treatment) HPI Comments: Patient presents this evening complaining of nausea vomiting and diarrhea this started last night.wife and son also with gastrointestinal symptoms. Child is doing much better today but was the first 1 to get sick. He has vomited around 3-4 times and have had about 6-7 episodes of liquidy diarrhea. No blood in stools. On and off cramping pains. No respiratory symptoms such as cough, shortness of breath or nasal congestion.  Patient is a 27 y.o. male presenting with diarrhea. The history is provided by the patient.  Diarrhea The primary symptoms include fever, nausea, vomiting and diarrhea. Primary symptoms do not include melena, hematemesis, jaundice, dysuria, myalgias, arthralgias or rash.  The illness is also significant for chills and anorexia. The illness does not include dysphagia, tenesmus or back pain. Associated medical issues do not include gallstones, bowel resection or irritable bowel syndrome.    Past Medical History  Diagnosis Date  . Obesity   . Achilles bursitis or tendinitis   . Unspecified constipation   . Unspecified sleep apnea   . Other specified personal history presenting hazards to health   . Other alteration of consciousness     Past Surgical History  Procedure Date  . Wisdom tooth extraction   . Tonsillectomy   . Shoulder surgery     Left    Family History  Problem Relation Age of Onset  . Hyperlipidemia Father   . Hyperlipidemia Mother   . Diabetes      grandparents  . Throat cancer      grandmother  . Lung cancer      grandfather    History  Substance Use Topics  . Smoking status: Former Smoker -- 1.5 packs/day for 6 years   Types: Cigarettes    Quit date: 03/16/2007  . Smokeless tobacco: Not on file  . Alcohol Use: Yes     Comment: Occasional      Review of Systems  Constitutional: Positive for fever, chills, activity change and appetite change. Negative for diaphoresis and unexpected weight change.  HENT: Negative for neck stiffness.   Respiratory: Negative for cough and shortness of breath.   Cardiovascular: Negative for chest pain and leg swelling.  Gastrointestinal: Positive for nausea, vomiting, diarrhea and anorexia. Negative for dysphagia, melena, hematemesis and jaundice.  Genitourinary: Negative for dysuria.  Musculoskeletal: Negative for myalgias, back pain and arthralgias.  Skin: Negative for rash.  Neurological: Negative for dizziness.    Allergies  Review of patient's allergies indicates no known allergies.  Home Medications   Current Outpatient Rx  Name  Route  Sig  Dispense  Refill  . AMLODIPINE BESYLATE 10 MG PO TABS   Oral   Take 1 tablet (10 mg total) by mouth daily.   30 tablet   3   . ACETAMINOPHEN 500 MG PO TABS   Oral   Take 1-2 tablets (500-1,000 mg total) by mouth every 8 (eight) hours as needed for pain.         Marland Kitchen DIPHENOXYLATE-ATROPINE 2.5-0.025 MG PO TABS   Oral   Take 1 tablet by mouth 4 (four) times daily as needed for diarrhea or loose stools.   30 tablet   0   . MELOXICAM  15 MG PO TABS      TAKE 1 TABLET BY MOUTH DAILY. TAKE WITH FOOD.   30 tablet   2   . NON FORMULARY      CPAP 12 cm, full face mask          . ONDANSETRON HCL 4 MG PO TABS   Oral   Take 1 tablet (4 mg total) by mouth every 8 (eight) hours as needed for nausea.   20 tablet   0   . ORALYTE PO SOLN      Drink 800 ml next 4, hours-   2000 mL   0   . PHENYLEPH-SHARK LIV OIL-MO-PET 0.25-3-14-71.9 % RE OINT   Rectal   Place 1 application rectally 2 (two) times daily as needed.           Marland Kitchen TRAMADOL HCL 50 MG PO TABS   Oral   Take 1-2 tablets (50-100 mg total) by mouth  every 8 (eight) hours as needed for pain (sedation caution).   30 tablet   1     BP 137/76  Pulse 130  Temp 100.8 F (38.2 C) (Oral)  Resp 20  Ht 6\' 1"  (1.854 m)  Wt 325 lb (147.419 kg)  BMI 42.88 kg/m2  SpO2 96%  Physical Exam  Nursing note and vitals reviewed. Constitutional:  Non-toxic appearance. He does not have a sickly appearance. He appears ill. No distress.  HENT:  Head: Normocephalic.  Mouth/Throat: No oropharyngeal exudate.  Eyes: Conjunctivae normal are normal. No scleral icterus.  Neck: Neck supple. No JVD present.  Cardiovascular: Regular rhythm.  Exam reveals no gallop and no friction rub.   No murmur heard. Pulmonary/Chest: Effort normal and breath sounds normal. No respiratory distress.  Abdominal: Soft. There is no splenomegaly or hepatomegaly. There is generalized tenderness. There is no rigidity, no rebound, no guarding, no CVA tenderness, no tenderness at McBurney's point and negative Murphy's sign.  Lymphadenopathy:    He has no cervical adenopathy.  Neurological: He is alert.  Skin: No rash noted. No erythema. No pallor.    ED Course  Procedures (including critical care time)  Labs Reviewed - No data to display No results found.   1. Gastroenteritis       MDM  Acute onset of gastrointestinal symptoms, patient, is tolerating oral fluid trial at urgent care was provided with a dose of Zofran. Wife experiencing similar symptoms at home. Most likely viral gastroenteritis. Have encouraged patient to follow-up with no improvement in next 48-72 hours. If worsening or unable to drink any of the medicines or to drink any fluids to present themselves to the emergency department for further management and treatment. Agreed with treatment plan and follow-up care as necessary. Prescriptions for both Zofran, lomotil, and oralyte       Jimmie Molly, MD 04/05/12 1835  Jimmie Molly, MD 04/05/12 1836  Jimmie Molly, MD 04/05/12 1610

## 2012-04-05 NOTE — ED Notes (Signed)
Pt reports nausea, vomiting and diarrhea since last night, body aches and fever - wife and child sick with same symptoms

## 2012-04-05 NOTE — Telephone Encounter (Signed)
Patient Information:  Caller Name: [place]  Phone: [phone]  Patient: Jorge Stevens, Jorge Stevens  Gender: Male  DOB: [birth year]  Age: 27 Years  PCP: Roxy Manns [facility])  Office Follow Up:  Does the office need to follow up with this patient?: No  Instructions For The Office: N/A   Symptoms  Reason For Call & Symptoms: vomiting and diarrhea.  Pt reports he has vomited x 6 and has diarrhea everytime he vomits (last night).  Pt has not vomited this am.  Reviewed Health History In EMR: Yes  Reviewed Medications In EMR: Yes  Reviewed Allergies In EMR: Yes  Reviewed Surgeries / Procedures: Yes  Date of Onset of Symptoms: 04/04/2012  Guideline(s) Used:  Vomiting  Disposition Per Guideline:   Home Care  Reason For Disposition Reached:   Vomiting with diarrhea  Advice Given:  Reassurance:  Vomiting can be caused by many types of illnesses. It can be caused by a stomach flu virus. It can be caused by eating or drinking something that disagreed with your stomach.  Adults with vomiting need to stay hydrated. This is the most important thing. If you don't drink and replace lost fluids, you may get dehydrated.  Clear Liquids:  Sip water or a rehydration drink (e.g., Gatorade or Powerade).  Other options: 1/2 strength flat lemon-lime soda or ginger ale.  After 4 hours without vomiting, increase the amount.  For Non-stop Vomiting, Try Sleeping:  Try to go to sleep (Reason: sleep often empties the stomach and relieves the need to vomit).  When you awaken, resume drinking liquids. Water works best initially.  Expected Course:  Vomiting from viral gastritis usually stops in 12 to 48 hours.  If diarrhea is present, it usually lasts for several days.  People with mild dehydration can usually treat themselves at home, by drinking more liquids.  People with moderate to severe dehydration may need medical care. signs of this include very dry mouth, dizziness, weakness, and decreased  urination.  Call Back If:  Vomiting lasts for more than 2 days (48 hours)  Signs of dehydration occur  You become worse.

## 2012-04-05 NOTE — ED Notes (Signed)
Pt given ginger ale to drink after zofran

## 2012-04-09 ENCOUNTER — Other Ambulatory Visit: Payer: Self-pay | Admitting: Family Medicine

## 2012-04-10 NOTE — Telephone Encounter (Signed)
Please schedule f/u for early summer and refil until then, thanks

## 2012-04-10 NOTE — Telephone Encounter (Signed)
Ok to refill? Pt has had a few acute OV but no recent f/u or CPE appt

## 2012-04-10 NOTE — Telephone Encounter (Signed)
appt scheduled for 08/29/12 and med refilled until then

## 2012-04-13 ENCOUNTER — Telehealth: Payer: Self-pay | Admitting: *Deleted

## 2012-04-14 NOTE — Telephone Encounter (Signed)
Opened in Error.

## 2012-08-04 ENCOUNTER — Telehealth: Payer: Self-pay

## 2012-08-04 MED ORDER — TRAMADOL HCL 50 MG PO TABS: 50.0000 mg | ORAL_TABLET | Freq: Three times a day (TID) | ORAL | Status: DC | PRN

## 2012-08-04 NOTE — Telephone Encounter (Signed)
pts wife said there has been a death in pts family and pt has developed pain in lt shoulder again; pt had lt shoulder surgery; spur resection 2006. Dr Para March referred pt to Dr Hyacinth Meeker, [facility] in Dec 2013 (that was last time pt had cortisone injection in shoulder). Pt contacted Dr Garen Lah office but no doctors in office today. Pt taking Meloxicam 15 mg one daily and Tylenol q 4-6 hours with no relief of pain. Pt cannot remember if Tramadol helped shoulder pain or not. Pts wife request appt today if pt could get cortisone injection today or pain med sent to [facility]. Request call back today.

## 2012-08-04 NOTE — Telephone Encounter (Signed)
Patient notified as instructed by telephone. 

## 2012-08-04 NOTE — Telephone Encounter (Signed)
Let's try some tramadol - [facility]  F/u with ortho next week when able  Watch out for sedation with this med

## 2012-08-24 ENCOUNTER — Ambulatory Visit: Payer: Self-pay | Admitting: Specialist

## 2012-08-29 ENCOUNTER — Ambulatory Visit: Payer: 59 | Admitting: Family Medicine

## 2012-08-29 ENCOUNTER — Telehealth: Payer: Self-pay | Admitting: *Deleted

## 2012-08-29 NOTE — Telephone Encounter (Signed)
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

## 2012-08-30 NOTE — Telephone Encounter (Signed)
Pt said he had a MRI on 08/24/12 and a f/u with ortho on 10/07/12, so he doesn't plan on using the medication long term he just needs a note for his manager from the past month where he has been taking the tramadol and going to the bathroom so his manager knows why he has been getting up a lot and not on the phone as much as he should

## 2012-08-30 NOTE — Telephone Encounter (Signed)
It sounds like we need to do something about his shoulder so he can get off the tramadol - I do not want this to become a long term medication  He either needs to f/u with ortho (I think Dr Hyacinth Meeker) or see Dr Patsy Lager here- please let me know if he needs a referral

## 2012-08-30 NOTE — Telephone Encounter (Signed)
Pt notified letter ready for pick up. 

## 2012-08-30 NOTE — Telephone Encounter (Signed)
In IN box 

## 2012-09-19 ENCOUNTER — Other Ambulatory Visit: Payer: Self-pay | Admitting: Family Medicine

## 2012-09-19 NOTE — Telephone Encounter (Signed)
Pt has not been seen since 01/2012.  Last refill request stated that pt needed appt before additional refills, appt scheduled for 6/17, pt cancelled and rescheduled for 7/15.  OK to refill x 1 month?

## 2012-09-19 NOTE — Telephone Encounter (Signed)
Ok to refill for a month, thanks

## 2012-09-20 NOTE — Telephone Encounter (Signed)
done

## 2012-09-26 ENCOUNTER — Encounter: Payer: Self-pay | Admitting: Family Medicine

## 2012-09-26 ENCOUNTER — Ambulatory Visit (INDEPENDENT_AMBULATORY_CARE_PROVIDER_SITE_OTHER): Payer: 59 | Admitting: Family Medicine

## 2012-09-26 VITALS — BP 136/88 | HR 86 | Temp 99.4°F | Ht 73.0 in | Wt 348.0 lb

## 2012-09-26 DIAGNOSIS — R197 Diarrhea, unspecified: Secondary | ICD-10-CM | POA: Insufficient documentation

## 2012-09-26 DIAGNOSIS — E669 Obesity, unspecified: Secondary | ICD-10-CM

## 2012-09-26 DIAGNOSIS — I10 Essential (primary) hypertension: Secondary | ICD-10-CM

## 2012-09-26 DIAGNOSIS — E785 Hyperlipidemia, unspecified: Secondary | ICD-10-CM | POA: Insufficient documentation

## 2012-09-26 LAB — CBC WITH DIFFERENTIAL/PLATELET
Basophils Absolute: 0 10*3/uL (ref 0.0–0.1)
Eosinophils Absolute: 0.1 10*3/uL (ref 0.0–0.7)
Eosinophils Relative: 1.3 % (ref 0.0–5.0)
HCT: 43.7 % (ref 39.0–52.0)
Hemoglobin: 14.8 g/dL (ref 13.0–17.0)
MCV: 84.4 fl (ref 78.0–100.0)
Monocytes Relative: 7.3 % (ref 3.0–12.0)
Neutro Abs: 3.8 10*3/uL (ref 1.4–7.7)
Platelets: 449 10*3/uL — ABNORMAL HIGH (ref 150.0–400.0)
RDW: 13.3 % (ref 11.5–14.6)
WBC: 6.1 10*3/uL (ref 4.5–10.5)

## 2012-09-26 LAB — COMPREHENSIVE METABOLIC PANEL
Alkaline Phosphatase: 60 U/L (ref 39–117)
BUN: 9 mg/dL (ref 6–23)
Calcium: 9.7 mg/dL (ref 8.4–10.5)
Chloride: 104 mEq/L (ref 96–112)
GFR: 104.3 mL/min (ref >60.00)
Glucose, Bld: 93 mg/dL (ref 70–99)
Potassium: 4 mEq/L (ref 3.5–5.1)
Sodium: 138 mEq/L (ref 135–145)
Total Bilirubin: 1.3 mg/dL — ABNORMAL HIGH (ref 0.3–1.2)

## 2012-09-26 LAB — LIPID PANEL
Cholesterol: 239 mg/dL — ABNORMAL HIGH (ref 0–200)
HDL: 33 mg/dL — ABNORMAL LOW (ref >39.00)
Triglycerides: 149 mg/dL (ref 0.0–149.0)

## 2012-09-26 LAB — TSH: TSH: 0.84 u[IU]/mL (ref 0.35–5.50)

## 2012-09-26 NOTE — Assessment & Plan Note (Signed)
From either his tramadol or mobic or both for shoulder problem - this is causing issues with work breaks  Note written  As he improved with PT will get off these medications Will update if no imp or other problems

## 2012-09-26 NOTE — Assessment & Plan Note (Signed)
bp in fair control at this time  No changes needed  Disc lifstyle change with low sodium diet and exercise  Pt feels better with no ha  Lab today

## 2012-09-26 NOTE — Assessment & Plan Note (Signed)
Discussed how this problem influences overall health and the risks it imposes  Reviewed plan for weight loss with lower calorie diet (via better food choices and also portion control or program like weight watchers) and exercise building up to or more than 30 minutes 5 days per week including some aerobic activity    Suggested and accountability program like wt watchers or my fitness pal

## 2012-09-26 NOTE — Patient Instructions (Addendum)
Blood pressure looks good today  Wean off the tramadol and meloxicam when able due to GI side effects  Look up "my fitness pal" app on your phone or tablet to start logging in calories in and out for weight loss  Think about using the recumbant exercise bike for exercise - aim for at least 30  Minutes 5 days per week Follow up in about 6 months Labs today

## 2012-09-26 NOTE — Progress Notes (Signed)
Subjective:    Patient ID: Jorge Stevens, male    DOB: Jan 29, 1986, 29 y.o.   MRN: [id number]  HPI Here for f/u of HTN   Has had a shoulder problems - a sprain / doing PT seeing ortho  Is giving him meloxicam for pain  He takes tramadol from [place] - and this makes him have frequent bm Also taking some tylenol  He needed a work note for more frequent trips to the bathroom   Has to have a BM about 4 times in an 11 hour shift and he has to log off the phone for each visit- he is getting flack for this No cramping or blood   Pain in the shoulder is slowly getting better   Wt is up 23 lb with bmi of over 45 Obese Diet- has really tried to cut back  Exercise- cannot be active due to his shoulder pain (and does not want to do anything) ? If could use an exercise bike (has a gym in his apt complex)  bp is stable today  No cp or palpitations or headaches or edema (headaches are much better)  No side effects to medicines  BP Readings from Last 3 Encounters:  09/26/12 136/88  04/05/12 137/76  01/20/12 142/78    On amlodipine    Chemistry      Component Value Date/Time   NA 140 12/15/2010 0859   K 3.7 12/15/2010 0859   CL 106 12/15/2010 0859   CO2 27 12/15/2010 0859   BUN 9 12/15/2010 0859   CREATININE 1.1 12/15/2010 0859      Component Value Date/Time   CALCIUM 9.0 12/15/2010 0859   ALKPHOS 49 12/15/2010 0859   AST 25 12/15/2010 0859   ALT 48 12/15/2010 0859   BILITOT 1.2 12/15/2010 0859      Patient Active Problem List   Diagnosis Date Noted  . Shoulder pain 01/20/2012  . Headache 11/17/2011  . Hypertension, essential 12/16/2010  . Routine general medical examination at a health care facility 12/07/2010  . Anal fissure 08/13/2010  . Constipation 08/13/2010  . OBSTRUCTIVE SLEEP APNEA 12/01/2008  . WISDOM TEETH EXTRACTION, HX OF 11/26/2008  . OBESITY 07/28/2006  . SNORING, HX OF 07/28/2006   Past Medical History  Diagnosis Date  . Obesity   . Achilles bursitis or  tendinitis   . Unspecified constipation   . Unspecified sleep apnea   . Other specified personal history presenting hazards to health(V15.89)   . Other alteration of consciousness    Past Surgical History  Procedure Laterality Date  . Wisdom tooth extraction    . Tonsillectomy    . Shoulder surgery      Left   History  Substance Use Topics  . Smoking status: Former Smoker -- 1.50 packs/day for 6 years    Types: Cigarettes    Quit date: 03/16/2007  . Smokeless tobacco: Not on file  . Alcohol Use: Yes     Comment: Occasional   Family History  Problem Relation Age of Onset  . Hyperlipidemia Father   . Hyperlipidemia Mother   . Diabetes      grandparents  . Throat cancer      grandmother  . Lung cancer      grandfather   No Known Allergies Current Outpatient Prescriptions on File Prior to Visit  Medication Sig Dispense Refill  . acetaminophen (TYLENOL) 500 MG tablet Take 1-2 tablets (500-1,000 mg total) by mouth every 8 (eight) hours as needed for pain.      Marland Kitchen  amLODipine (NORVASC) 10 MG tablet TAKE 1 TABLET BY MOUTH DAILY.  30 tablet  0  . diphenoxylate-atropine (LOMOTIL) 2.5-0.025 MG per tablet Take 1 tablet by mouth 4 (four) times daily as needed for diarrhea or loose stools.  30 tablet  0  . meloxicam (MOBIC) 15 MG tablet TAKE 1 TABLET BY MOUTH DAILY. TAKE WITH FOOD.  30 tablet  2  . NON FORMULARY CPAP 12 cm, full face mask       . ondansetron (ZOFRAN) 4 MG tablet Take 1 tablet (4 mg total) by mouth every 8 (eight) hours as needed for nausea.  20 tablet  0  . Oral Electrolytes (ORALYTE) SOLN Drink 800 ml next 4, hours-  2000 mL  0  . phenylephrine-shark liver oil-mineral oil-petrolatum (PREPARATION H) 0.25-3-14-71.9 % rectal ointment Place 1 application rectally 2 (two) times daily as needed.        . traMADol (ULTRAM) 50 MG tablet Take 1-2 tablets (50-100 mg total) by mouth every 8 (eight) hours as needed for pain (sedation caution).  30 tablet  0   No current  facility-administered medications on file prior to visit.     Review of Systems Review of Systems  Constitutional: Negative for fever, appetite change, fatigue and unexpected weight change.  Eyes: Negative for pain and visual disturbance.  Respiratory: Negative for cough and shortness of breath.   Cardiovascular: Negative for cp or palpitations    Gastrointestinal: Negative for nausea,  and constipation. neg for blood in stool or abd pain  Genitourinary: Negative for urgency and frequency.  Skin: Negative for pallor or rash   MSK pos for shoulder pain  Neurological: Negative for weakness, light-headedness, numbness and headaches.  Hematological: Negative for adenopathy. Does not bruise/bleed easily.  Psychiatric/Behavioral: Negative for dysphoric mood. The patient is not nervous/anxious.         Objective:   Physical Exam  Constitutional: He appears well-developed and well-nourished. No distress.  Morbidly obese and well appearing   HENT:  Head: Normocephalic and atraumatic.  Mouth/Throat: Oropharynx is clear and moist.  Eyes: Conjunctivae and EOM are normal. Pupils are equal, round, and reactive to light. Right eye exhibits no discharge. No scleral icterus.  Neck: Normal range of motion. Neck supple. No JVD present. Carotid bruit is not present. No thyromegaly present.  Cardiovascular: Normal rate, regular rhythm, normal heart sounds and intact distal pulses.  Exam reveals no gallop.   Pulmonary/Chest: Effort normal and breath sounds normal. No respiratory distress. He has no wheezes. He has no rales.  Abdominal: Soft. Bowel sounds are normal. He exhibits no distension, no abdominal bruit and no mass. There is no tenderness. There is no rebound and no guarding.  Musculoskeletal: He exhibits no edema.  Lymphadenopathy:    He has no cervical adenopathy.  Neurological: He is alert. He has normal reflexes. No cranial nerve deficit. He exhibits normal muscle tone. Coordination normal.   Skin: Skin is warm and dry. No rash noted. No erythema. No pallor.  Psychiatric: He has a normal mood and affect.          Assessment & Plan:

## 2012-09-28 ENCOUNTER — Ambulatory Visit: Payer: 59 | Admitting: Pulmonary Disease

## 2012-09-28 ENCOUNTER — Encounter: Payer: Self-pay | Admitting: *Deleted

## 2012-11-03 ENCOUNTER — Other Ambulatory Visit: Payer: Self-pay | Admitting: Family Medicine

## 2012-11-23 ENCOUNTER — Telehealth: Payer: Self-pay | Admitting: Family Medicine

## 2012-11-23 DIAGNOSIS — E785 Hyperlipidemia, unspecified: Secondary | ICD-10-CM

## 2012-11-23 NOTE — Telephone Encounter (Signed)
Message copied by Judy Pimple on Thu Nov 23, 2012  5:00 PM ------      Message from: Alvina Chou      Created: Thu Nov 16, 2012  3:36 PM      Regarding: Lab orders for Friday, 9.12.14       Lab orders for a 2 month f/u ------

## 2012-11-24 ENCOUNTER — Other Ambulatory Visit (INDEPENDENT_AMBULATORY_CARE_PROVIDER_SITE_OTHER): Payer: 59

## 2012-11-24 DIAGNOSIS — E785 Hyperlipidemia, unspecified: Secondary | ICD-10-CM

## 2012-11-24 LAB — LIPID PANEL
Total CHOL/HDL Ratio: 7
Triglycerides: 136 mg/dL (ref 0.0–149.0)
VLDL: 27.2 mg/dL (ref 0.0–40.0)

## 2012-11-24 LAB — LDL CHOLESTEROL, DIRECT: Direct LDL: 156.3 mg/dL

## 2012-11-27 ENCOUNTER — Ambulatory Visit (INDEPENDENT_AMBULATORY_CARE_PROVIDER_SITE_OTHER): Payer: 59 | Admitting: Pulmonary Disease

## 2012-11-27 ENCOUNTER — Encounter: Payer: Self-pay | Admitting: Pulmonary Disease

## 2012-11-27 VITALS — BP 178/90 | HR 82 | Ht 73.0 in | Wt 345.4 lb

## 2012-11-27 DIAGNOSIS — I10 Essential (primary) hypertension: Secondary | ICD-10-CM

## 2012-11-27 DIAGNOSIS — G4730 Sleep apnea, unspecified: Secondary | ICD-10-CM

## 2012-11-27 DIAGNOSIS — Z23 Encounter for immunization: Secondary | ICD-10-CM

## 2012-11-27 NOTE — Progress Notes (Signed)
  Subjective:    Patient ID: Jorge Stevens, male    DOB: [birth year], 27 y.o.   MRN: [id number]  HPI  26/M, morbidly obese with obstructive sleep apnea on CPAP 12cm  Snoring did not improve after tonsillectomy in 2008.  PSG >> mod obstructive sleep apnea >> started CPAP 10 cm, medium quattro mask, goal 12 cm  download 10/18- 02/16/09 >> no residual events, good compliance on 10 cm. >> increased to 12 cm for snoring    11/27/2012  Pt reports he wears his CPAP everynight x 6-8 hrs a night. Pt has not been able to get a download off machine bc the sims card not working. Pt is wanting a new machine. He has had his current machine x 4 years. Pt snores when he firsts drifts off to sleep but then once aslpee he stops. Most days he feels well rested Wife reports some snoring at sleep onset on 12 cm, pressure OK, feels rested in am No  gasping episode  Wt unchanged Humidity setting at 3 - c/o dryness Full face Mask ok -  BP high today - reports family stressors  Review of Systems neg for any significant sore throat, dysphagia, itching, sneezing, nasal congestion or excess/ purulent secretions, fever, chills, sweats, unintended wt loss, pleuritic or exertional cp, hempoptysis, orthopnea pnd or change in chronic leg swelling. Also denies presyncope, palpitations, heartburn, abdominal pain, nausea, vomiting, diarrhea or change in bowel or urinary habits, dysuria,hematuria, rash, arthralgias, visual complaints, headache, numbness weakness or ataxia.     Objective:   Physical Exam  Gen. Pleasant, obese, in no distress ENT - no lesions, no post nasal drip Neck: No JVD, no thyromegaly, no carotid bruits Lungs: no use of accessory muscles, no dullness to percussion, decreased without rales or rhonchi  Cardiovascular: Rhythm regular, heart sounds  normal, no murmurs or gallops, no peripheral edema Musculoskeletal: No deformities, no cyanosis or clubbing , no tremors       Assessment & Plan:

## 2012-11-27 NOTE — Assessment & Plan Note (Signed)
FLu shot We will check download I will have CHOICE look into getting you a new machine  Weight loss encouraged, compliance with goal of at least 4-6 hrs every night is the expectation. Advised against medications with sedative side effects Cautioned against driving when sleepy - understanding that sleepiness will vary on a day to day basis

## 2012-11-27 NOTE — Assessment & Plan Note (Signed)
Recheck  - attributes today's high BP to family stressor

## 2012-11-27 NOTE — Patient Instructions (Addendum)
FLu shot We will check download I will have CHOICE look into getting you a new machine

## 2012-11-28 ENCOUNTER — Telehealth: Payer: Self-pay | Admitting: Pulmonary Disease

## 2012-11-28 ENCOUNTER — Ambulatory Visit: Payer: 59 | Admitting: Family Medicine

## 2012-11-28 DIAGNOSIS — Z0289 Encounter for other administrative examinations: Secondary | ICD-10-CM

## 2012-11-28 NOTE — Telephone Encounter (Signed)
Error.Jorge Stevens ° °

## 2012-12-07 ENCOUNTER — Telehealth: Payer: Self-pay | Admitting: Pulmonary Disease

## 2012-12-07 NOTE — Telephone Encounter (Signed)
1 mnth Download 11/29/12 on 12 cm >> AHI 5/h, events better controlled , good usage stay on this setting

## 2012-12-08 NOTE — Telephone Encounter (Signed)
I spoke with patient about results and he verbalized understanding and had no questions 

## 2013-03-29 ENCOUNTER — Encounter: Payer: Self-pay | Admitting: Internal Medicine

## 2013-03-29 ENCOUNTER — Ambulatory Visit (INDEPENDENT_AMBULATORY_CARE_PROVIDER_SITE_OTHER): Payer: 59 | Admitting: Internal Medicine

## 2013-03-29 VITALS — BP 160/92 | HR 87 | Temp 100.8°F | Wt 345.2 lb

## 2013-03-29 DIAGNOSIS — R059 Cough, unspecified: Secondary | ICD-10-CM

## 2013-03-29 DIAGNOSIS — R509 Fever, unspecified: Secondary | ICD-10-CM

## 2013-03-29 DIAGNOSIS — R0989 Other specified symptoms and signs involving the circulatory and respiratory systems: Secondary | ICD-10-CM

## 2013-03-29 DIAGNOSIS — R51 Headache: Secondary | ICD-10-CM

## 2013-03-29 DIAGNOSIS — R05 Cough: Secondary | ICD-10-CM

## 2013-03-29 DIAGNOSIS — R6883 Chills (without fever): Secondary | ICD-10-CM

## 2013-03-29 DIAGNOSIS — R6889 Other general symptoms and signs: Secondary | ICD-10-CM

## 2013-03-29 LAB — POCT INFLUENZA A/B
INFLUENZA A, POC: NEGATIVE
INFLUENZA B, POC: NEGATIVE

## 2013-03-29 MED ORDER — KETOROLAC TROMETHAMINE 60 MG/2ML IM SOLN
60.0000 mg | Freq: Once | INTRAMUSCULAR | Status: AC
  Administered 2013-03-29: 60 mg via INTRAMUSCULAR

## 2013-03-29 MED ORDER — METHYLPREDNISOLONE ACETATE 80 MG/ML IJ SUSP
80.0000 mg | Freq: Once | INTRAMUSCULAR | Status: AC
  Administered 2013-03-29: 80 mg via INTRAMUSCULAR

## 2013-03-29 NOTE — Patient Instructions (Signed)
Viral Infections °A virus is a type of germ. Viruses can cause: °· Minor sore throats. °· Aches and pains. °· Headaches. °· Runny nose. °· Rashes. °· Watery eyes. °· Tiredness. °· Coughs. °· Loss of appetite. °· Feeling sick to your stomach (nausea). °· Throwing up (vomiting). °· Watery poop (diarrhea). °HOME CARE  °· Only take medicines as told by your doctor. °· Drink enough water and fluids to keep your pee (urine) clear or pale yellow. Sports drinks are a good choice. °· Get plenty of rest and eat healthy. Soups and broths with crackers or rice are fine. °GET HELP RIGHT AWAY IF:  °· You have a very bad headache. °· You have shortness of breath. °· You have chest pain or neck pain. °· You have an unusual rash. °· You cannot stop throwing up. °· You have watery poop that does not stop. °· You cannot keep fluids down. °· You or your child has a temperature by mouth above 102° F (38.9° C), not controlled by medicine. °· Your baby is older than 3 months with a rectal temperature of 102° F (38.9° C) or higher. °· Your baby is 3 months old or younger with a rectal temperature of 100.4° F (38° C) or higher. °MAKE SURE YOU:  °· Understand these instructions. °· Will watch this condition. °· Will get help right away if you are not doing well or get worse. °Document Released: 02/12/2008 Document Revised: 05/24/2011 Document Reviewed: 07/07/2010 °ExitCare® Patient Information ©2014 [employer]. ° °

## 2013-03-29 NOTE — Progress Notes (Signed)
Pre-visit discussion using our clinic review tool. No additional management support is needed unless otherwise documented below in the visit note.  

## 2013-03-29 NOTE — Progress Notes (Signed)
HPI  Pt presents to the clinic today with c/o cough, chest congestion, headache, chills and body aches. This started last night and reports that he feels worse this morning.  Review of Systems      Past Medical History  Diagnosis Date  . Obesity   . Achilles bursitis or tendinitis   . Unspecified constipation   . Unspecified sleep apnea   . Other specified personal history presenting hazards to health(V15.89)   . Other alteration of consciousness     Family History  Problem Relation Age of Onset  . Hyperlipidemia Father   . Hyperlipidemia Mother   . Diabetes      grandparents  . Throat cancer      grandmother  . Lung cancer      grandfather    History   Social History  . Marital Status: Married    Spouse Name: N/A    Number of Children: 1  . Years of Education: N/A   Occupational History  . [employer]    Social History Main Topics  . Smoking status: Former Smoker -- 1.50 packs/day for 6 years    Types: Cigarettes    Quit date: 03/16/2007  . Smokeless tobacco: Not on file  . Alcohol Use: Yes     Comment: Occasional  . Drug Use: No  . Sexual Activity: Not on file   Other Topics Concern  . Not on file   Social History Narrative   Married      Works at [employer], likes his job      Occasional coffee      One son 2010    No Known Allergies   Constitutional: Positive headache, fatigue and fever. Denies abrupt weight changes.  HEENT:  Positive sore throat. Denies eye redness, eye pain, pressure behind the eyes, facial pain, nasal congestion, ear pain, ringing in the ears, wax buildup, runny nose or bloody nose. Respiratory: Positive cough. Denies difficulty breathing or shortness of breath.  Cardiovascular: Denies chest pain, chest tightness, palpitations or swelling in the hands or feet.   No other specific complaints in a complete review of systems (except as listed in HPI above).  Objective:   BP 160/92  Pulse 87  Temp(Src) 100.8 F (38.2 C)  (Oral)  Wt 345 lb 4 oz (156.604 kg)  SpO2 98% Wt Readings from Last 3 Encounters:  03/29/13 345 lb 4 oz (156.604 kg)  11/27/12 345 lb 6.4 oz (156.672 kg)  09/26/12 348 lb (157.852 kg)     General: Appears his stated age, ill appearing but in NAD. HEENT: Head: normal shape and size; Eyes: sclera white, no icterus, conjunctiva pink, PERRLA and EOMs intact; Ears: Tm's gray and intact, normal light reflex; Nose: mucosa pink and moist, septum midline; Throat/Mouth: + PND. Teeth present, mucosa erythematous and moist, no exudate noted, no lesions or ulcerations noted.  Neck: Mild cervical lymphadenopathy. Neck supple, trachea midline. No massses, lumps or thyromegaly present.  Cardiovascular: Normal rate and rhythm. S1,S2 noted.  No murmur, rubs or gallops noted. No JVD or BLE edema. No carotid bruits noted. Pulmonary/Chest: Normal effort and scattered rhonchi throughout. No respiratory distress. No wheezes, rales noted.      Assessment & Plan:  Fever, chills, cough, chest congestion, headache secondary to flu like symptoms  Rapid Flu-negative 80 mg Depo IM today and 60 mg Toradol IM for headache Tylenol/Ibuprofen for pain/fever Get some rest and drink plenty of water Do salt water gargles for the sore throat  I advised him if his headache does not get better, he needs to go to the ER RTC as needed or if symptoms persist.

## 2013-03-29 NOTE — Addendum Note (Signed)
Addended by: Roena MaladyEVONTENNO, Romaine Maciolek Y on: 03/29/2013 11:29 AM   Modules accepted: Orders

## 2013-03-30 ENCOUNTER — Ambulatory Visit: Payer: 59 | Admitting: Family Medicine

## 2013-04-13 ENCOUNTER — Telehealth: Payer: Self-pay | Admitting: *Deleted

## 2013-04-13 NOTE — Telephone Encounter (Signed)
Thanks - I will need you to talk to him to fill in the cert start date and end date at the bottom Thanks In IN box

## 2013-04-13 NOTE — Telephone Encounter (Signed)
Received fax from [employer] with FMLA papers for pt, form placed in your inbox, pt said we can use the same information as last year, a copy of last years form attached

## 2013-04-16 NOTE — Telephone Encounter (Signed)
Left voicemail requesting pt to call office 

## 2013-04-16 NOTE — Telephone Encounter (Signed)
Pt left voicemail with the start date/end date for his form, form faxed to [employer] and sent to be scanned

## 2013-04-23 ENCOUNTER — Ambulatory Visit: Payer: 59 | Admitting: Family Medicine

## 2013-04-27 ENCOUNTER — Telehealth: Payer: Self-pay | Admitting: *Deleted

## 2013-04-27 NOTE — Telephone Encounter (Signed)
Due to pt's son Jorge Stevens missing so many days from being sick recently pt's FMLA papers had to have the amount of time off changed, pt has changed the time and we just have to fill out the rest, form placed in your inbox

## 2013-04-30 NOTE — Telephone Encounter (Signed)
For faxed and sent to scanning

## 2013-04-30 NOTE — Telephone Encounter (Signed)
Done and in IN box thanks 

## 2013-08-03 ENCOUNTER — Other Ambulatory Visit: Payer: Self-pay | Admitting: Family Medicine

## 2013-08-07 NOTE — Telephone Encounter (Signed)
Electronic refill request, please advise  

## 2013-08-07 NOTE — Telephone Encounter (Signed)
done

## 2013-08-07 NOTE — Telephone Encounter (Signed)
Please refill times 5 

## 2013-10-22 ENCOUNTER — Other Ambulatory Visit: Payer: Self-pay | Admitting: Family Medicine

## 2013-10-22 NOTE — Telephone Encounter (Signed)
Please schedule a f/u and refill until then 

## 2013-10-22 NOTE — Telephone Encounter (Signed)
Electronic refill request, no future/recent appts., please advise

## 2013-10-23 NOTE — Telephone Encounter (Signed)
appt scheduled and med refilled 

## 2013-12-20 ENCOUNTER — Telehealth: Payer: Self-pay

## 2013-12-20 NOTE — Telephone Encounter (Signed)
Mrs Rosezetta SchlatterBurnette said pt has already requested records from [facility] and [facility] re; to lt shoulder pain. Revonda Standardllison said has received [facility] records still waiting on [facility] records. Pt wants to see Dr Patsy Lageropland ASAP;pt believes he needs cortisone shot in shoulder; weather changing causes pt to have more shoulder pain.30 min appt scheduled for 12/24/13 9:45 AM.

## 2013-12-24 ENCOUNTER — Ambulatory Visit (INDEPENDENT_AMBULATORY_CARE_PROVIDER_SITE_OTHER): Payer: 59 | Admitting: Family Medicine

## 2013-12-24 ENCOUNTER — Encounter: Payer: Self-pay | Admitting: Family Medicine

## 2013-12-24 VITALS — BP 132/80 | HR 91 | Temp 99.1°F | Ht 73.0 in | Wt 351.5 lb

## 2013-12-24 DIAGNOSIS — M25512 Pain in left shoulder: Secondary | ICD-10-CM

## 2013-12-24 DIAGNOSIS — S43005S Unspecified dislocation of left shoulder joint, sequela: Secondary | ICD-10-CM

## 2013-12-24 DIAGNOSIS — Z9889 Other specified postprocedural states: Secondary | ICD-10-CM

## 2013-12-24 NOTE — Patient Instructions (Signed)

## 2013-12-24 NOTE — Progress Notes (Signed)
Pre visit review using our clinic review tool, if applicable. No additional management support is needed unless otherwise documented below in the visit note. 

## 2013-12-24 NOTE — Progress Notes (Signed)
Dr. Karleen HampshireSpencer T. Jalesa Thien, MD, CAQ Sports Medicine [facility] [address] [place], [id number] Phone: [phone] Fax: [phone]  12/24/2013  Patient: Jorge Stevens, MRN: [id number], DOB: 03/11/1986, 28 y.o.  Primary Physician:  Roxy MannsMarne Tower, MD  Chief Complaint: Shoulder Pain  Subjective:   Jorge Stevens is a 28 y.o. very pleasant male patient who presents with the following:  Complex L shoulder patient who initially had a severe AC separation while playing football in high school with a notably defect. This did not cause him any problems for a few years, then he started to get pain with elevation.   Seen by Dr. Kristeen Missan Caffrey. MRI reviewed, which shows Type 2 acromium, irregular distal clavicle, subac bursitis. He underwent an arthroscopic SAD/DCE approximately in 2007. He did well for about 3 years.   Saw [facility] Miler several times a few years, then had some PT, felt ok. 2 subac injections, felt ok for a while after each.  He also recently went to [facility], and saw one of the PA's there a few times and had another subac injection.  [facility], which showed no RTC tear and no labral tear.  Running will bother his shoulder.  He is generally not active, and he does not routinely work on rehab for his shoulder.   Past Medical History, Surgical History, Social History, Family History, Problem List, Medications, and Allergies have been reviewed and updated if relevant.  GEN: No fevers, chills. Nontoxic. Primarily MSK c/o today. MSK: Detailed in the HPI GI: tolerating PO intake without difficulty Neuro: No numbness, parasthesias, or tingling associated. Otherwise the pertinent positives of the ROS are noted above.   Objective:   BP 132/80  Pulse 91  Temp(Src) 99.1 F (37.3 C) (Oral)  Ht 6\' 1"  (1.854 m)  Wt 351 lb 8 oz (159.439 kg)  BMI 46.38 kg/m2   GEN: Well-developed,well-nourished,in no acute distress;  alert,appropriate and cooperative throughout examination HEENT: Normocephalic and atraumatic without obvious abnormalities. Ears, externally no deformities PULM: Breathing comfortably in no respiratory distress EXT: No clubbing, cyanosis, or edema PSYCH: Normally interactive. Cooperative during the interview. Pleasant. Friendly and conversant. Not anxious or depressed appearing. Normal, full affect.  Shoulder: LEFT Inspection: No muscle wasting or winging Ecchymosis/edema: neg  AC joint, scapula, clavicle: TTP at distal clavicle and AC joint Cervical spine: NT, full ROM Spurling's: neg Abduction: full, 5/5, painful arc of motion Flexion: full, 5/5 IR, full, lift-off: 5/5 ER at neutral: full, 5/5 AC crossover: pos Neer: pos Hawkins: pos Drop Test: neg Empty Can: pos Supraspinatus insertion: mild-mod T Bicipital groove: NT Speed's: neg Yergason's: neg Sulcus sign: neg Scapular dyskinesis: none C5-T1 intact  Neuro: Sensation intact Grip 5/5   Radiology: MRI 2007 reviewed MRI arthrogram from [facility] reviewed, also  Assessment and Plan:   Left shoulder pain - Plan: Ambulatory referral to Physical Therapy  History of arthroscopic surgery of shoulder - Plan: Ambulatory referral to Physical Therapy  Shoulder separation, left, sequela  Challenging case with injury while young and trauma to the joint. S/p SAD/DCE and he very recently had a normal MR arthrogram of the shoulder.   Certainly getting impingement, RTC tendinopathy. I discussed with him that he will likely have some pain forever, but I recommended working on bringing in maintenance rehab to his life so that he can have a more functional shoulder.   I think that repetitive steroids would not be helpful in this 27 year  old.  Alternatively, he can always get the opinion of a shoulder expert like Dr. Ave Filterhandler or [facility]ues [facility].   Follow-up: 2 mo  Orders Placed This Encounter  Procedures  . Ambulatory referral  to Physical Therapy    Signed,  Karleen HampshireSpencer T. Marielena Harvell, MD   Patient's Medications  New Prescriptions   No medications on file  Previous Medications   ACETAMINOPHEN (TYLENOL) 500 MG TABLET    Take 1-2 tablets (500-1,000 mg total) by mouth every 8 (eight) hours as needed for pain.   AMLODIPINE (NORVASC) 10 MG TABLET    TAKE 1 TABLET BY MOUTH DAILY.   DIPHENOXYLATE-ATROPINE (LOMOTIL) 2.5-0.025 MG PER TABLET    Take 1 tablet by mouth 4 (four) times daily as needed for diarrhea or loose stools.   MELOXICAM (MOBIC) 15 MG TABLET    TAKE 1 TABLET BY MOUTH DAILY. TAKE WITH FOOD.   NON FORMULARY    CPAP 12 cm, full face mask    ONDANSETRON (ZOFRAN) 4 MG TABLET    Take 1 tablet (4 mg total) by mouth every 8 (eight) hours as needed for nausea.   PHENYLEPHRINE-SHARK LIVER OIL-MINERAL OIL-PETROLATUM (PREPARATION H) 0.25-3-14-71.9 % RECTAL OINTMENT    Place 1 application rectally 2 (two) times daily as needed.     TRAMADOL (ULTRAM) 50 MG TABLET    Take 1-2 tablets (50-100 mg total) by mouth every 8 (eight) hours as needed for pain (sedation caution).  Modified Medications   No medications on file  Discontinued Medications   No medications on file

## 2013-12-26 ENCOUNTER — Ambulatory Visit: Payer: 59 | Admitting: Family Medicine

## 2014-01-01 ENCOUNTER — Encounter: Payer: Self-pay | Admitting: Pulmonary Disease

## 2014-01-01 ENCOUNTER — Ambulatory Visit (INDEPENDENT_AMBULATORY_CARE_PROVIDER_SITE_OTHER): Payer: 59 | Admitting: Pulmonary Disease

## 2014-01-01 VITALS — BP 132/62 | HR 90 | Temp 99.2°F | Ht 74.0 in | Wt 362.4 lb

## 2014-01-01 DIAGNOSIS — G4730 Sleep apnea, unspecified: Secondary | ICD-10-CM

## 2014-01-01 NOTE — Progress Notes (Signed)
   Subjective:    Patient ID: Jorge Stevens, male    DOB: 05-06-1985, 28 y.o.   MRN: [id number]  HPI  26/M, morbidly obese with obstructive sleep apnea on CPAP 12cm  Snoring did not improve after tonsillectomy in 2008.   Significant tests/ events  PSG >> mod obstructive sleep apnea >> started CPAP 10 cm, medium quattro mask, goal 12 cm  download 10/18- 02/16/09 >> no residual events, good compliance on 10 cm. >> increased to 12 cm for snoring  1 mnth Download 11/29/12 on 12 cm >> AHI 5/h, events better controlled , good usage  01/01/2014  Chief Complaint  Patient presents with  . Follow-up    CPAP is getting too much water in the line and feels like he is drowning at night; on Med setting hard to breath; still snoring with the machine, wants to get new machine; CPAP 6 cm, cannot breath below 6cm   Humidity setting at 6 - c/o water in line Full face Mask ok -  Wife reports some snoring persists, pressure OK, feels rested in am No gasping episode  Wt increased 10 lbs Pt reports he wears his CPAP everynight x 6-8 hrs a night. He wants a new machine.  Most days he feels well rested  BP better controlled      Review of Systems neg for any significant sore throat, dysphagia, itching, sneezing, nasal congestion or excess/ purulent secretions, fever, chills, sweats, unintended wt loss, pleuritic or exertional cp, hempoptysis, orthopnea pnd or change in chronic leg swelling. Also denies presyncope, palpitations, heartburn, abdominal pain, nausea, vomiting, diarrhea or change in bowel or urinary habits, dysuria,hematuria, rash, arthralgias, visual complaints, headache, numbness weakness or ataxia.      Objective:   Physical Exam  Gen. Pleasant, obese, in no distress ENT - no lesions, no post nasal drip, class 2 airway Neck: No JVD, no thyromegaly, no carotid bruits Lungs: no use of accessory muscles, no dullness to percussion, decreased without rales or rhonchi  Cardiovascular:  Rhythm regular, heart sounds  normal, no murmurs or gallops, no peripheral edema Musculoskeletal: No deformities, no cyanosis or clubbing , no tremors        Assessment & Plan:

## 2014-01-01 NOTE — Assessment & Plan Note (Addendum)
Increase pr to 14 cm Rx for new CPAP 14 cm will be sent & download obtained in 1 month Rx for med Mirage quattro mask will be sent to [facility] Keep humidity setting at 4-5 range Flu shot  Weight loss encouraged, compliance with goal of at least 6 hrs every night is the expectation. Advised against medications with sedative side effects Cautioned against driving when sleepy - understanding that sleepiness will vary on a day to day basis

## 2014-01-01 NOTE — Patient Instructions (Signed)
Rx for new CPAP 14 cm will be sent & download obtained in 1 month Rx for med Mirage quattro mask will be sent to [facility] Keep humidity setting at 4-5 range Flu shot

## 2014-03-14 ENCOUNTER — Other Ambulatory Visit: Payer: Self-pay | Admitting: *Deleted

## 2014-03-14 MED ORDER — AMLODIPINE BESYLATE 10 MG PO TABS: 10.0000 mg | ORAL_TABLET | Freq: Every day | ORAL | Status: DC

## 2014-04-03 ENCOUNTER — Encounter: Payer: 59 | Admitting: Family Medicine

## 2014-04-11 ENCOUNTER — Emergency Department: Payer: Self-pay | Admitting: Emergency Medicine

## 2014-04-12 ENCOUNTER — Encounter: Payer: Self-pay | Admitting: Internal Medicine

## 2014-04-12 ENCOUNTER — Encounter: Payer: Self-pay | Admitting: Family Medicine

## 2014-04-12 ENCOUNTER — Ambulatory Visit (INDEPENDENT_AMBULATORY_CARE_PROVIDER_SITE_OTHER): Payer: 59 | Admitting: Internal Medicine

## 2014-04-12 VITALS — BP 140/76 | HR 98 | Temp 98.5°F | Wt 353.0 lb

## 2014-04-12 DIAGNOSIS — M25512 Pain in left shoulder: Secondary | ICD-10-CM

## 2014-04-12 DIAGNOSIS — M7552 Bursitis of left shoulder: Secondary | ICD-10-CM

## 2014-04-12 MED ORDER — CYCLOBENZAPRINE HCL 10 MG PO TABS: 10.0000 mg | ORAL_TABLET | Freq: Every day | ORAL | Status: DC

## 2014-04-12 NOTE — Progress Notes (Signed)
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 male presenting to clinic for ED f/u for left shoulder pain x 5 days. Pain onset on Sunday and increasing intensity over course of the week, from moderate to severe. 1/28: ED dx bursitis of left shoulder; pt. received shot of toradol, prednisone x 10 days, Norco and Robaxin. Pt. reports today because can't afford the Robaxin and is requesting another muscle relaxant. Sports injury to left shoulder x 10 years ago and was last seen for left shoulder pain on 12/24/13 by Dr. Karleen HampshireSpencer Copland. Referred to PT but pt. reports that it only helps until 4 months after completing therapy. Appt. Dr. Milinda Antisower 2/15 but open appt on wed  Review of Systems  Past Medical History  Diagnosis Date  . Morbid obesity with BMI of 45.0-49.9, adult   . Unspecified sleep apnea   . Other specified personal history presenting hazards to health(V15.89)   . Other alteration of consciousness     Family History  Problem Relation Age of Onset  . Hyperlipidemia Father   . Hyperlipidemia Mother   . Diabetes      grandparents  . Throat cancer      grandmother  . Lung cancer      grandfather    History   Social History  . Marital Status: Married    Spouse Name: N/A    Number of Children: 1  . Years of Education: N/A   Occupational History  . [employer]    Social History Main Topics  . Smoking status: Former Smoker -- 1.50 packs/day for 6 years    Types: Cigarettes    Quit date: 03/16/2007  . Smokeless tobacco: Never Used  . Alcohol Use: Yes     Comment: Occasional  . Drug Use: No  . Sexual Activity: Not on file   Other Topics Concern  . Not on file   Social History Narrative   Married      Works at [employer], likes his job      Occasional coffee      One son 2010    No Known Allergies  Constitutional: Denies fever, malaise, fatigue, headache or abrupt weight changes.   Cardiovascular:  Denies chest pain, chest tightness or  palpitations. Respiratory: Denies difficulty breathing or shortness of breath. Musculoskeletal: Positive severe pain in left shoulder; moving causing more pain   Objective:   BP 140/76 mmHg  Pulse 98  Temp(Src) 98.5 F (36.9 C) (Oral)  Wt 353 lb (160.12 kg)  SpO2 98%  Gen: Well-developed,well-nourished, appears in pain while moving onto patient table with left arm in sling. Obese HEENT: Normocephalic and atraumatic without obvious abnormalities. Heart: RRR, S1 and S2. No murmurs noted. Lungs: CTAB, no respiratory distress. Psych: Alert & oriented x 3. Not anxious or depressed appearing. MSK: LEFT shoulder; no muscle wasting, pain to palpation over acromian; No  ROM - pt. declined due to pain.  Neuro: Sensation intact; C5-T1 intact  Assessment and Plan:  Left shoulder pain/Bursitis:  ER notes reviewed Continue prednisone, toradol PRN, Norco PRN Rx flexeril 10 MG tablet   F/U with Dr. Patsy Lageropland as advised

## 2014-04-12 NOTE — Patient Instructions (Signed)
Bursitis Bursitis is inflammation of a bursa. A bursa is a soft, fluid-filled sac. It cushions the soft tissue around a bone. Bursitis often occurs in the bursas near the shoulders, elbows, knees, pelvis, hips, heel, and Achilles tendon.  SYMPTOMS   Pain and tenderness in the affected area. Sometimes, pain radiates into surrounding areas. Specifically, pain with movement.  Limited range of motion of the affected joint.  Sometimes, painless swelling of the bursa.  Fever (when infected). CAUSES   Injury to a joint or bursa.  Overuse or strenuous exercise of a joint.  Gout (disease with inflamed joints).  Prolonged pressure on a joint containing bursas (resting on an elbow or kneeling).  Arthritis.  Acute or chronic infection.  Calcium deposits in shoulder tendons, with degeneration of the tendon. RISK INCREASES WITH:  Vigorous, repeated, or sudden increase in athletic training or activity level.  Failure to warm up properly.  Overstretching.  Improper exercise technique.  Playing sports on [place]roTurf. PREVENTION  Avoid injuries or overuse of muscles.  Warm up and cool down properly. Do this before and after physical activity.  Maintain proper conditioning:  Joint flexibility.  Muscle strength and endurance.  Cardiovascular fitness.  Learn and use proper technique.  Wear protective equipment. PROGNOSIS  With proper treatment, symptoms often go away within 7 to 14 days.  RELATED COMPLICATIONS   Frequent recurrence of symptoms. This can result in a chronic, repetitive problem.  Joint stiffness.  Limited joint movement.  Infection of bursa.  Chronic inflammation or scarring of bursa. TREATMENT Treatment first involves protecting and resting the bursa and its joint. You may use ice or an elastic bandage to reduce inflammation. Anti-inflammatory medicines may help resolve the swelling. If symptoms persist despite treatment, a caregiver may withdraw fluid  from the bursa. They might also consider a corticosteroid injection. Sometimes, bursitis will persist in spite of nonsurgical treatment or will become infected. These cases may require removal (surgical excision) of the bursa.  MEDICATION   If pain medicine is needed, nonsteroidal anti-inflammatory medicines, such as aspirin and ibuprofen, or other minor pain relievers, such as acetaminophen, are often recommended.  Do not take pain medicine for 7 days before surgery.  Prescription pain relievers are usually only prescribed after surgery. Use only as directed and only as much as you need.  Ointments applied to the skin may be helpful.  Corticosteroid injections may be given. This is done to reduce inflammation in the bursa. HEAT AND COLD:  Cold treatment (icing) relieves pain and reduces inflammation. Cold treatment should be applied for 10 to 15 minutes every 2 to 3 hours for inflammation and pain, and immediately after any activity that aggravates your symptoms. Use ice packs or an ice massage.  Heat treatment may be used prior to performing the stretching and strengthening activities prescribed by your caregiver, physical therapist, or athletic trainer. Use a heat pack or a warm soak. SEEK MEDICAL CARE IF:   Symptoms get worse or do not improve in 2 weeks, despite treatment.  New, unexplained symptoms develop. (Drugs used in treatment may produce side effects.) Document Released: 03/01/2005 Document Revised: 07/16/2013 Document Reviewed: 06/13/2008 ExitCare Patient Information 2015 [employer]. This information is not intended to replace advice given to you by your health care provider. Make sure you discuss any questions you have with your health care provider.  

## 2014-04-12 NOTE — Progress Notes (Signed)
Pre visit review using our clinic review tool, if applicable. No additional management support is needed unless otherwise documented below in the visit note. 

## 2014-04-16 ENCOUNTER — Ambulatory Visit: Payer: 59 | Admitting: Family Medicine

## 2014-04-17 ENCOUNTER — Telehealth: Payer: Self-pay | Admitting: Family Medicine

## 2014-04-17 ENCOUNTER — Ambulatory Visit: Payer: 59 | Admitting: Family Medicine

## 2014-04-17 NOTE — Telephone Encounter (Signed)
Done- in IN box- please make sure it looks ok  Thanks

## 2014-04-17 NOTE — Telephone Encounter (Signed)
Patient called about his FMLA paperwork.  Patient said on the FMLA form it didn't have a start date or an end date.  The start date is 04/15/14 and end date is 04/16/15. It has to be initialed and dated next to the start and end date. Please fax form back with cover letter and form.  Patient will drop off form today for it to be corrected.

## 2014-04-17 NOTE — Telephone Encounter (Signed)
Completed. Faxed FMLA paperwork.

## 2014-04-23 ENCOUNTER — Encounter: Payer: Self-pay | Admitting: Family Medicine

## 2014-04-23 ENCOUNTER — Ambulatory Visit (INDEPENDENT_AMBULATORY_CARE_PROVIDER_SITE_OTHER): Payer: 59 | Admitting: Family Medicine

## 2014-04-23 VITALS — BP 148/70 | HR 100 | Temp 98.7°F | Ht 73.0 in | Wt 355.4 lb

## 2014-04-23 DIAGNOSIS — G8929 Other chronic pain: Secondary | ICD-10-CM | POA: Insufficient documentation

## 2014-04-23 DIAGNOSIS — M7552 Bursitis of left shoulder: Secondary | ICD-10-CM | POA: Insufficient documentation

## 2014-04-23 MED ORDER — KETOROLAC TROMETHAMINE 10 MG PO TABS: 10.0000 mg | ORAL_TABLET | Freq: Four times a day (QID) | ORAL | Status: DC | PRN

## 2014-04-23 MED ORDER — HYDROCODONE-ACETAMINOPHEN 5-325 MG PO TABS: 1.0000 | ORAL_TABLET | Freq: Four times a day (QID) | ORAL | Status: DC | PRN

## 2014-04-23 NOTE — Patient Instructions (Signed)
Stop mobic for now and take toradol with food instead-stop it bothers your stomach Use norco as well when pain is severe- watch out for sedation Go to [facility] on Thursday as planned

## 2014-04-23 NOTE — Progress Notes (Signed)
Pre visit review using our clinic review tool, if applicable. No additional management support is needed unless otherwise documented below in the visit note. 

## 2014-04-23 NOTE — Progress Notes (Signed)
Subjective:    Patient ID: Jorge Stevens, male    DOB: June 28, 1985, 29 y.o.   MRN: [id number]  HPI Pt here with chronic L shoulder problems   See problem list for his history - starting with AC separation in HS and now chronic bursitis/ RTC tendonopathy   Seen in ED Given prednisone toradol prn  norco prn   Right now has tylenol and meloxicam (toradol oral worked a lot better)  Wearing a sling  Pain with any bouncing at this point  Even has trouble typing  He is in a lot of pain right now    Has tried flexeril -no help   Rehab status  Last PT was last year - has exercises to do at home and bands to use  The home PT tend to worsen his symptoms--   Has appt with Dr Jorge Stevens - at [facility] - March 15th - a shoulder specialist  On Thursday - has a pre visit with another physician since pain is so bad   Patient Active Problem List   Diagnosis Date Noted  . Chronic bursitis of left shoulder 04/23/2014  . Frequent loose stools 09/26/2012  . Other and unspecified hyperlipidemia 09/26/2012  . Hypertension, essential 12/16/2010  . Routine general medical examination at a health care facility 12/07/2010  . Anal fissure 08/13/2010  . Constipation 08/13/2010  . Sleep apnea 12/01/2008  . WISDOM TEETH EXTRACTION, HX OF 11/26/2008  . OBESITY 07/28/2006   Past Medical History  Diagnosis Date  . Morbid obesity with BMI of 45.0-49.9, adult   . Unspecified sleep apnea   . Other specified personal history presenting hazards to health(V15.89)   . Other alteration of consciousness    Past Surgical History  Procedure Laterality Date  . Wisdom tooth extraction    . Tonsillectomy    . Shoulder arthroscopy w/ subacromial decompression and distal clavicle excision Left     Dr. Kristeen Stevens   History  Substance Use Topics  . Smoking status: Former Smoker -- 1.50 packs/day for 6 years    Types: Cigarettes    Quit date: 03/16/2007  . Smokeless tobacco: Never Used  . Alcohol Use:  Yes     Comment: Occasional   Family History  Problem Relation Age of Onset  . Hyperlipidemia Father   . Hyperlipidemia Mother   . Diabetes      grandparents  . Throat cancer      grandmother  . Lung cancer      grandfather   No Known Allergies Current Outpatient Prescriptions on File Prior to Visit  Medication Sig Dispense Refill  . acetaminophen (TYLENOL) 500 MG tablet Take 1-2 tablets (500-1,000 mg total) by mouth every 8 (eight) hours as needed for pain.    Marland Kitchen amLODipine (NORVASC) 10 MG tablet Take 1 tablet (10 mg total) by mouth daily. 90 tablet 0  . cyclobenzaprine (FLEXERIL) 10 MG tablet Take 1 tablet (10 mg total) by mouth at bedtime. 30 tablet 0  . meloxicam (MOBIC) 15 MG tablet TAKE 1 TABLET BY MOUTH DAILY. TAKE WITH FOOD. 30 tablet 4  . NON FORMULARY CPAP 12 cm, full face mask      No current facility-administered medications on file prior to visit.      Review of Systems    Review of Systems  Constitutional: Negative for fever, appetite change, fatigue and unexpected weight change.  Eyes: Negative for pain and visual disturbance.  Respiratory: Negative for cough and shortness of breath.  Cardiovascular: Negative for cp or palpitations    Gastrointestinal: Negative for nausea, diarrhea and constipation.  Genitourinary: Negative for urgency and frequency.  Skin: Negative for pallor or rash   MSK pos for L shoulder pain and limited rom  Neurological: Negative for weakness, light-headedness, numbness and headaches.  Hematological: Negative for adenopathy. Does not bruise/bleed easily.  Psychiatric/Behavioral: Negative for dysphoric mood. The patient is not nervous/anxious.      Objective:   Physical Exam  Constitutional: He appears well-developed and well-nourished. No distress.  obese and well appearing   HENT:  Head: Normocephalic and atraumatic.  Eyes: Conjunctivae and EOM are normal. Pupils are equal, round, and reactive to light. No scleral icterus.    Neck: Normal range of motion. Neck supple.  Cardiovascular: Normal rate and regular rhythm.   Musculoskeletal: He exhibits tenderness. He exhibits no edema.  L shoulder - very limited rom - cannot abduct more than about 50 degrees today  Tender acromion   Lymphadenopathy:    He has no cervical adenopathy.  Neurological: He is alert. He has normal reflexes.  Skin: Skin is warm and dry.  Psychiatric: He has a normal mood and affect.          Assessment & Plan:   Problem List Items Addressed This Visit      Musculoskeletal and Integument   Chronic bursitis of left shoulder - Primary    Complex case and pain is much worse lately  [facility]- needs something to help before this  Px toradol for the short term (oral)- per pt this works better  Also norco for more severe breakthrough pain - disc risk of habit/sedation - he voiced understanding

## 2014-04-24 NOTE — Assessment & Plan Note (Signed)
Complex case and pain is much worse lately  [facility]- needs something to help before this  Px toradol for the short term (oral)- per pt this works better  Also norco for more severe breakthrough pain - disc risk of habit/sedation - he voiced understanding

## 2014-04-29 ENCOUNTER — Encounter: Payer: Self-pay | Admitting: Family Medicine

## 2014-04-30 ENCOUNTER — Encounter: Payer: Self-pay | Admitting: Family Medicine

## 2014-05-16 ENCOUNTER — Encounter: Admit: 2014-05-16 | Disposition: A | Discharge status: Home / Self Care | Payer: Self-pay

## 2014-05-22 ENCOUNTER — Telehealth: Payer: Self-pay

## 2014-05-22 MED ORDER — HYDROCHLOROTHIAZIDE 25 MG PO TABS: 25.0000 mg | ORAL_TABLET | Freq: Every day | ORAL | Status: DC

## 2014-05-22 NOTE — Telephone Encounter (Signed)
Patient notified as instructed by telephone and verbalized understanding. Patient stated that he is at work and will have to call back in the morning to schedule the follow-up appointment. Rx called to pharmacy as instructed.

## 2014-05-22 NOTE — Telephone Encounter (Signed)
Gala Murdochanisha pts wife left v/m; pt had therapy today and BP was 180/87.Pt has been going to PT for shoulder problem.  Pt is taking amlodipine 10 mg taking one daily. Tanisha request cb to pt at [phone]

## 2014-05-22 NOTE — Telephone Encounter (Signed)
Please call in hctz -take one daily F/u with me in 7-10 days If any problems let me know  conitnue your current medicine as well

## 2014-05-22 NOTE — Telephone Encounter (Signed)
Spoke to patient and was advised that he went last Thursday for PT and his BP was high and they would not do any PT, but he does not remember the numbers. Patient stated that he went again today and before they even got started his BP was 180//87 while he was resting. Patient stated that the physical therapist would not do any physical work with him and only uses a tens unit on him. Patient is taking his BP medication, but is concerned why his BP is so high. Pharmacy [facility].

## 2014-05-22 NOTE — Telephone Encounter (Signed)
Continue PT as planned If headache or other symptom from bp please let me know  Check it while relaxed and not exercising -it should be lower   Follow up if it remains elevated

## 2014-05-23 ENCOUNTER — Other Ambulatory Visit: Payer: Self-pay | Admitting: Family Medicine

## 2014-05-23 NOTE — Telephone Encounter (Signed)
Patient aware Dr Milinda Antisower is out of the office.

## 2014-05-23 NOTE — Telephone Encounter (Signed)
Out of the office and cannot do until tomorrow Let him know and route back to me  Thanks

## 2014-05-23 NOTE — Telephone Encounter (Signed)
Pt left v/m requesting refill hydrocodone.

## 2014-05-24 MED ORDER — HYDROCODONE-ACETAMINOPHEN 5-325 MG PO TABS: 1.0000 | ORAL_TABLET | Freq: Four times a day (QID) | ORAL | Status: DC | PRN

## 2014-05-24 NOTE — Telephone Encounter (Signed)
Patient aware hydrocodone rx is ready for pick up. 

## 2014-05-24 NOTE — Telephone Encounter (Signed)
Px printed for pick up in IN box  

## 2014-06-14 ENCOUNTER — Encounter: Admit: 2014-06-14 | Disposition: A | Discharge status: Home / Self Care | Payer: Self-pay

## 2014-06-21 ENCOUNTER — Ambulatory Visit (INDEPENDENT_AMBULATORY_CARE_PROVIDER_SITE_OTHER): Payer: 59 | Admitting: Family Medicine

## 2014-06-21 ENCOUNTER — Encounter: Payer: Self-pay | Admitting: Family Medicine

## 2014-06-21 ENCOUNTER — Ambulatory Visit: Payer: 59 | Admitting: Family Medicine

## 2014-06-21 VITALS — BP 138/84 | HR 101 | Temp 99.3°F | Wt 342.8 lb

## 2014-06-21 DIAGNOSIS — I10 Essential (primary) hypertension: Secondary | ICD-10-CM

## 2014-06-21 DIAGNOSIS — E785 Hyperlipidemia, unspecified: Secondary | ICD-10-CM

## 2014-06-21 DIAGNOSIS — Z131 Encounter for screening for diabetes mellitus: Secondary | ICD-10-CM | POA: Diagnosis not present

## 2014-06-21 DIAGNOSIS — M7552 Bursitis of left shoulder: Secondary | ICD-10-CM

## 2014-06-21 LAB — COMPREHENSIVE METABOLIC PANEL
ALBUMIN: 4.4 g/dL (ref 3.5–5.2)
ALT: 80 U/L — AB (ref 0–53)
AST: 36 U/L (ref 0–37)
Alkaline Phosphatase: 63 U/L (ref 39–117)
BUN: 13 mg/dL (ref 6–23)
CHLORIDE: 99 meq/L (ref 96–112)
CO2: 31 mEq/L (ref 19–32)
CREATININE: 1.1 mg/dL (ref 0.40–1.50)
Calcium: 10.2 mg/dL (ref 8.4–10.5)
GFR: 100.9 mL/min (ref >60.00)
Glucose, Bld: 86 mg/dL (ref 70–99)
POTASSIUM: 3.7 meq/L (ref 3.5–5.1)
Sodium: 137 mEq/L (ref 135–145)
Total Bilirubin: 0.8 mg/dL (ref 0.2–1.2)
Total Protein: 8.8 g/dL — ABNORMAL HIGH (ref 6.0–8.3)

## 2014-06-21 LAB — LIPID PANEL
CHOL/HDL RATIO: 5
CHOLESTEROL: 229 mg/dL — AB (ref 0–200)
HDL: 43 mg/dL (ref >39.00)
LDL Cholesterol: 159 mg/dL — ABNORMAL HIGH (ref 0–99)
NonHDL: 186
TRIGLYCERIDES: 136 mg/dL (ref 0.0–149.0)
VLDL: 27.2 mg/dL (ref 0.0–40.0)

## 2014-06-21 LAB — HEMOGLOBIN A1C: HEMOGLOBIN A1C: 5.9 % (ref 4.6–6.5)

## 2014-06-21 NOTE — Patient Instructions (Signed)
Labs today for cholesterol and diabetes screen  Follow up with orthopedics as planned on 4/15 as planned  Avoid red meat/ fried foods/ egg yolks/ fatty breakfast meats/ butter, cheese and high fat dairy/ and shellfish  (for cholesterol) For blood sugar and weight loss-watch carbs and also sugar beverages

## 2014-06-21 NOTE — Progress Notes (Signed)
Subjective:    Patient ID: Jorge Stevens, male    DOB: 26-Jun-1985, 29 y.o.   MRN: [id number]  HPI Here for screen for DM   [facility] of them) Did have an injection  Had appt in March 15th  Did another injection under ultrasound guidance  Did not help that time  He says the orthopedic does not know what is wrong with his shoulder  There was a question re: DM  Still doing PT (but cannot go back to PT since FMLA ran out)   [facility]   Taking tylenol and nsaid for pain at this point   FMLA ran out yesterday- needs to be extended   Has lost 13 lb  Not eating as much  Stressed out about pain   Dr Ashley RoyaltyMatthews, Dr Madelon Lipsaffrey, Dr Vivia Budgeebo, Dr Burns SpainGergus -for his shoulder   GM was DM  No thirst or frequent urination  Never had elevated sugar here   Lab Results  Component Value Date   CHOL 205* 11/24/2012   HDL 31.40* 11/24/2012   LDLCALC 119* 12/15/2010   LDLDIRECT 156.3 11/24/2012   TRIG 136.0 11/24/2012   CHOLHDL 7 11/24/2012   does eat some fatty foods   Does not eat a lot of sweets  But drinks a lot of lemonade  No excessive thirst or urination   Patient Active Problem List   Diagnosis Date Noted  . Chronic bursitis of left shoulder 04/23/2014  . Frequent loose stools 09/26/2012  . Other and unspecified hyperlipidemia 09/26/2012  . Hypertension, essential 12/16/2010  . Routine general medical examination at a health care facility 12/07/2010  . Anal fissure 08/13/2010  . Constipation 08/13/2010  . Sleep apnea 12/01/2008  . WISDOM TEETH EXTRACTION, HX OF 11/26/2008  . OBESITY 07/28/2006   Past Medical History  Diagnosis Date  . Morbid obesity with BMI of 45.0-49.9, adult   . Unspecified sleep apnea   . Other specified personal history presenting hazards to health(V15.89)   . Other alteration of consciousness    Past Surgical History  Procedure Laterality Date  . Wisdom tooth extraction    . Tonsillectomy     . Shoulder arthroscopy w/ subacromial decompression and distal clavicle excision Left     Dr. Kristeen Missan Caffrey   History  Substance Use Topics  . Smoking status: Former Smoker -- 1.50 packs/day for 6 years    Types: Cigarettes    Quit date: 03/16/2007  . Smokeless tobacco: Never Used  . Alcohol Use: Yes     Comment: Occasional   Family History  Problem Relation Age of Onset  . Hyperlipidemia Father   . Hyperlipidemia Mother   . Diabetes      grandparents  . Throat cancer      grandmother  . Lung cancer      grandfather   No Known Allergies Current Outpatient Prescriptions on File Prior to Visit  Medication Sig Dispense Refill  . acetaminophen (TYLENOL) 500 MG tablet Take 1-2 tablets (500-1,000 mg total) by mouth every 8 (eight) hours as needed for pain.    Marland Kitchen. amLODipine (NORVASC) 10 MG tablet Take 1 tablet (10 mg total) by mouth daily. 90 tablet 0  . hydrochlorothiazide (HYDRODIURIL) 25 MG tablet Take 1 tablet (25 mg total) by mouth daily. 30 tablet 3  . HYDROcodone-acetaminophen (NORCO/VICODIN) 5-325 MG per tablet Take 1-2 tablets by mouth every 6 (six) hours as needed for moderate pain or  severe pain (watch for sedation). 60 tablet 0  . ketorolac (TORADOL) 10 MG tablet Take 1 tablet (10 mg total) by mouth every 6 (six) hours as needed for moderate pain or severe pain (take with food). 30 tablet 0  . NON FORMULARY CPAP 12 cm, full face mask      No current facility-administered medications on file prior to visit.    Review of Systems Review of Systems  Constitutional: Negative for fever, appetite change, fatigue and unexpected weight change.  Eyes: Negative for pain and visual disturbance.  Respiratory: Negative for cough and shortness of breath.   Cardiovascular: Negative for cp or palpitations    Gastrointestinal: Negative for nausea, diarrhea and constipation.  Genitourinary: Negative for urgency and frequency.  Skin: Negative for pallor or rash   MSK pos for shoulder  pain and limited rom Neurological: Negative for weakness, light-headedness, numbness and headaches.  Hematological: Negative for adenopathy. Does not bruise/bleed easily.  Psychiatric/Behavioral: Negative for dysphoric mood. The patient is not nervous/anxious.         Objective:   Physical Exam  Constitutional: He appears well-developed and well-nourished. No distress.  obese and well appearing   HENT:  Head: Normocephalic and atraumatic.  Mouth/Throat: Oropharynx is clear and moist.  Eyes: Conjunctivae and EOM are normal. Pupils are equal, round, and reactive to light.  Neck: Normal range of motion. Neck supple. No JVD present. Carotid bruit is not present. No thyromegaly present.  Cardiovascular: Normal rate, regular rhythm, normal heart sounds and intact distal pulses.  Exam reveals no gallop.   Pulmonary/Chest: Effort normal and breath sounds normal. No respiratory distress. He has no wheezes. He has no rales.  No crackles  Abdominal: Soft. Bowel sounds are normal. He exhibits no distension, no abdominal bruit and no mass. There is no tenderness.  Musculoskeletal: He exhibits no edema.  Poor rom L shoulder   Lymphadenopathy:    He has no cervical adenopathy.  Neurological: He is alert. He has normal reflexes.  Skin: Skin is warm and dry. No rash noted.  Psychiatric: He has a normal mood and affect.          Assessment & Plan:   Problem List Items Addressed This Visit      Cardiovascular and Mediastinum   Hypertension, essential    bp in fair control at this time  BP Readings from Last 1 Encounters:  06/21/14 138/84   No changes needed Disc lifstyle change with low sodium diet and exercise  Lab today      Relevant Orders   Comprehensive metabolic panel (Completed)     Musculoskeletal and Integument   Chronic bursitis of left shoulder    For f/u at [facility] mid month In PT  Having difficulty doing that with work schedule Struggling with pain and immobility          Other   Diabetes mellitus screening    Pt is obese with HTN and some DM in family  His orthopedic doctor urged screen for DM A1c  Today       Relevant Orders   Hemoglobin A1c (Completed)   Comprehensive metabolic panel (Completed)   Hyperlipidemia - Primary    Lab today Rev low sat fat diet and goals for chol      Relevant Orders   Lipid panel (Completed)

## 2014-06-21 NOTE — Progress Notes (Signed)
Pre visit review using our clinic review tool, if applicable. No additional management support is needed unless otherwise documented below in the visit note. 

## 2014-06-23 NOTE — Assessment & Plan Note (Signed)
bp in fair control at this time  BP Readings from Last 1 Encounters:  06/21/14 138/84   No changes needed Disc lifstyle change with low sodium diet and exercise  Lab today

## 2014-06-23 NOTE — Assessment & Plan Note (Signed)
Lab today Rev low sat fat diet and goals for chol

## 2014-06-23 NOTE — Assessment & Plan Note (Signed)
[facility] In PT  Having difficulty doing that with work schedule Struggling with pain and immobility

## 2014-06-23 NOTE — Assessment & Plan Note (Signed)
Pt is obese with HTN and some DM in family  His orthopedic doctor urged screen for DM A1c  Today

## 2014-06-30 ENCOUNTER — Encounter: Payer: Self-pay | Admitting: Family Medicine

## 2014-07-02 ENCOUNTER — Telehealth: Payer: Self-pay | Admitting: Family Medicine

## 2014-07-02 MED ORDER — GABAPENTIN 100 MG PO CAPS: 100.0000 mg | ORAL_CAPSULE | Freq: Two times a day (BID) | ORAL | Status: DC

## 2014-07-02 NOTE — Telephone Encounter (Signed)
Gabapentin px for chronic shoulder pain

## 2014-07-22 ENCOUNTER — Telehealth: Payer: Self-pay | Admitting: Family Medicine

## 2014-07-22 MED ORDER — GABAPENTIN 100 MG PO CAPS: 200.0000 mg | ORAL_CAPSULE | Freq: Two times a day (BID) | ORAL | Status: DC

## 2014-07-22 NOTE — Telephone Encounter (Signed)
Gabapentin- sent in /inc dose

## 2014-08-16 ENCOUNTER — Other Ambulatory Visit: Payer: Self-pay | Admitting: Pulmonary Disease

## 2014-08-16 ENCOUNTER — Encounter: Payer: Self-pay | Admitting: Pulmonary Disease

## 2014-08-16 DIAGNOSIS — G4733 Obstructive sleep apnea (adult) (pediatric): Secondary | ICD-10-CM

## 2014-08-16 NOTE — Telephone Encounter (Signed)
RA - please advise. Thanks! 

## 2014-08-22 ENCOUNTER — Telehealth: Payer: Self-pay | Admitting: Pulmonary Disease

## 2014-08-22 DIAGNOSIS — G4733 Obstructive sleep apnea (adult) (pediatric): Secondary | ICD-10-CM

## 2014-08-22 NOTE — Telephone Encounter (Signed)
Increased pressure to 15 cm-report back in 1 month

## 2014-08-22 NOTE — Telephone Encounter (Signed)
Patient says that he is still snoring and still having witnessed apneas at night.   RA - please advise.

## 2014-08-22 NOTE — Telephone Encounter (Signed)
+  14cm No residuals Good usage No leak AHI: 4.8

## 2014-08-22 NOTE — Telephone Encounter (Signed)
Order entered to increase pressure and obtain download in 1 month.  Patient notified.  Nothing further needed.

## 2014-09-03 ENCOUNTER — Encounter: Payer: Self-pay | Admitting: Pulmonary Disease

## 2014-09-27 ENCOUNTER — Telehealth: Payer: Self-pay | Admitting: Pulmonary Disease

## 2014-09-27 NOTE — Telephone Encounter (Signed)
I spoke with patient about results and he verbalized understanding and had no questions 

## 2014-09-27 NOTE — Telephone Encounter (Signed)
Last OV: 01/01/14 RA CPAP 15cm AHI: 5.2 No residuals No leak Good usage

## 2014-10-06 ENCOUNTER — Other Ambulatory Visit: Payer: Self-pay | Admitting: Family Medicine

## 2014-10-15 ENCOUNTER — Encounter: Payer: Self-pay | Admitting: Family Medicine

## 2014-10-15 ENCOUNTER — Ambulatory Visit (INDEPENDENT_AMBULATORY_CARE_PROVIDER_SITE_OTHER): Payer: 59 | Admitting: Family Medicine

## 2014-10-15 ENCOUNTER — Ambulatory Visit: Payer: 59 | Admitting: Family Medicine

## 2014-10-15 VITALS — BP 130/80 | HR 88 | Temp 98.7°F | Ht 73.0 in | Wt 359.0 lb

## 2014-10-15 DIAGNOSIS — E669 Obesity, unspecified: Secondary | ICD-10-CM | POA: Diagnosis not present

## 2014-10-15 DIAGNOSIS — M7552 Bursitis of left shoulder: Secondary | ICD-10-CM

## 2014-10-15 MED ORDER — HYDROCODONE-ACETAMINOPHEN 5-325 MG PO TABS: 1.0000 | ORAL_TABLET | ORAL | Status: DC | PRN

## 2014-10-15 MED ORDER — GABAPENTIN 300 MG PO CAPS: 300.0000 mg | ORAL_CAPSULE | Freq: Three times a day (TID) | ORAL | Status: DC

## 2014-10-15 NOTE — Progress Notes (Signed)
Subjective:    Patient ID: Jorge Stevens, male    DOB: [birth year], 29 y.o.   MRN: [id number]  HPI Here for f/u after visit at the pain clinic  Saw Jorge Stevens over there  Told he is low risk for opiates because of age   Told him there is nothing more they can do - mechanically / injection wise  Recommended acupuncture (out of pocket)   Hx of shoulder separation s/p SAD /DCE Nl MR arthrogram RTC tendinopathy with chronic pain  Still in a lot of pain   Every day Gabapentin 200 mg bid - not helping  Is open to going up on it  No side effects   norco -out of - last refilled in March  Took 1-2 pills per day  No side effects from that   Takes trazadone at night for sleep- psychiatrist (takes this with xanax at night)  Also saw a counselor  pristiq - for depression as well- psychiatrist (this does not help pain)  Mood is getting better  Sees Dr Jorge Stevens for that   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 Diabetes mellitus screening 06/21/2014  . Chronic bursitis of left shoulder 04/23/2014  . Frequent loose stools 09/26/2012  . Hyperlipidemia 09/26/2012  . Hypertension, essential 12/16/2010  . Routine general medical examination at a health care facility 12/07/2010  . Anal fissure 08/13/2010  . Constipation 08/13/2010  . Sleep apnea 12/01/2008  . WISDOM TEETH EXTRACTION, HX OF 11/26/2008  . Obesity 07/28/2006   Past Medical History  Diagnosis Date  . Morbid obesity with BMI of 45.0-49.9, adult   . Unspecified sleep apnea   . Other specified personal history presenting hazards to health(V15.89)   . Other alteration of consciousness    Past Surgical History  Procedure Laterality Date  . Wisdom tooth extraction    . Tonsillectomy    . Shoulder arthroscopy w/ subacromial decompression and distal clavicle excision Left    Jorge Stevens   History  Substance Use Topics  . Smoking status: Former Smoker -- 1.50 packs/day for 6 years    Types: Cigarettes    Quit date: 03/16/2007  . Smokeless tobacco: Never Used  . Alcohol Use: 0.0 oz/week    0 Standard drinks or equivalent per week     Comment: Occasional   Family History  Problem Relation Age of Onset  . Hyperlipidemia Father   . Hyperlipidemia Mother   . Diabetes      grandparents  . Throat cancer      grandmother  . Lung cancer      grandfather   No Known Allergies Current Outpatient Prescriptions on File Prior to Visit  Medication Sig Dispense Refill  . acetaminophen (TYLENOL) 500 MG tablet Take 1-2 tablets (500-1,000 mg total) by mouth every 8 (eight) hours as needed for pain.    Marland Kitchen amLODipine (NORVASC) 10 MG tablet Take 1 tablet (10 mg total) by mouth daily. 90 tablet 0  . hydrochlorothiazide (HYDRODIURIL) 25 MG tablet TAKE 1 TABLET BY MOUTH DAILY 30 tablet 2  . NON FORMULARY CPAP 12 cm, full face mask      No current facility-administered medications on file prior to visit.    Review of Systems Review of Systems  Constitutional: Negative for fever, appetite change,  and unexpected weight change.  Eyes: Negative for pain and visual disturbance.  Respiratory: Negative for cough and shortness of breath.   Cardiovascular: Negative for cp or palpitations    Gastrointestinal: Negative for nausea, diarrhea and constipation.  Genitourinary: Negative for urgency and frequency.  Skin: Negative for pallor or rash   MSK mod to severe L shoulder pain with limited rom  Neurological: Negative for weakness, light-headedness, numbness and headaches.  Hematological: Negative for adenopathy. Does not bruise/bleed easily.  Psychiatric/Behavioral: pos for dysphoric mood/depression controlled by medication . The patient is not nervous/anxious.         Objective:   Physical Exam  Constitutional: He appears well-developed and well-nourished. No distress.   obese and well appearing   HENT:  Head: Normocephalic and atraumatic.  Musculoskeletal: He exhibits tenderness.  L shoulder is tender (anterior) with very limited rom (cannot abduct) Pt has pain even when walking   Neurological: He is alert. He has normal reflexes.  Skin: Skin is warm and dry. No rash noted. No erythema. No pallor.  Psychiatric: His speech is normal and behavior is normal. Thought content normal. His affect is blunt. His affect is not inappropriate. Thought content is not paranoid. He exhibits a depressed mood. He expresses no homicidal and no suicidal ideation.  Very pleasant  Nursing note and vitals reviewed.         Assessment & Plan:   Problem List Items Addressed This Visit    Chronic bursitis of left shoulder - Primary    Pt had visit to the pain clinic -they had little to offer except hydrocodone px which we can do  Now seeing psychiatry for depression rel to chronic pain -on tricyclic/sleep med and pristiq- helps mood Will see if inc gabapentin to 300 mg bid -then tid if helpful and tolerated (side eff rev) Refilled hydrocodone for bid use prn (to minimize) # 60 no ref -will have to pick up monthly- and agrees to controlled sub agreement Px written today   Will update with how he is doing       Obesity    Discussed how this problem influences overall health and the risks it imposes  Reviewed plan for weight loss with lower calorie diet (via better food choices and also portion control or program like weight watchers) and exercise building up to or more than 30 minutes 5 days per week including some aerobic activity   Pt has limited opt for exercise due to chronic shoulder pain (hurts to walk) Suggested exercise bike  Also cut calories Psych meds also impair ability to loose wt

## 2014-10-15 NOTE — Assessment & Plan Note (Signed)
Pt had visit to the pain clinic -they had little to offer except hydrocodone px which we can do  Now seeing psychiatry for depression rel to chronic pain -on tricyclic/sleep med and pristiq- helps mood Will see if inc gabapentin to 300 mg bid -then tid if helpful and tolerated (side eff rev) Refilled hydrocodone for bid use prn (to minimize) # 60 no ref -will have to pick up monthly- and agrees to controlled sub agreement Px written today   Will update with how he is doing

## 2014-10-15 NOTE — Patient Instructions (Addendum)
Work on [employer] loss - cut your eating as much as you can  We can work out - 60 pills per month of the hydrocodone (do not mix with tylenol)--- do not take at night with xanax (xanax needs to be more than 4 hours after last dose of hydrocodone)  Get by with as little as you need  We will do random drug tests  Increase gabapentin to 300 mg twice daily -- after a few weeks you can go up to three times daily if if helps   Integrative therapies -check with them about acupuncture

## 2014-10-15 NOTE — Assessment & Plan Note (Signed)
Discussed how this problem influences overall health and the risks it imposes  Reviewed plan for weight loss with lower calorie diet (via better food choices and also portion control or program like weight watchers) and exercise building up to or more than 30 minutes 5 days per week including some aerobic activity   Pt has limited opt for exercise due to chronic shoulder pain (hurts to walk) Suggested exercise bike  Also cut calories Psych meds also impair ability to loose wt

## 2014-10-15 NOTE — Progress Notes (Signed)
Pre visit review using our clinic review tool, if applicable. No additional management support is needed unless otherwise documented below in the visit note. 

## 2014-11-01 ENCOUNTER — Encounter: Payer: Self-pay | Admitting: Family Medicine

## 2014-11-15 ENCOUNTER — Ambulatory Visit (INDEPENDENT_AMBULATORY_CARE_PROVIDER_SITE_OTHER): Payer: 59 | Admitting: Family Medicine

## 2014-11-15 ENCOUNTER — Encounter: Payer: Self-pay | Admitting: Family Medicine

## 2014-11-15 VITALS — BP 122/66 | HR 79 | Temp 98.3°F | Wt 353.0 lb

## 2014-11-15 DIAGNOSIS — L299 Pruritus, unspecified: Secondary | ICD-10-CM

## 2014-11-15 LAB — HEPATIC FUNCTION PANEL
ALT: 44 U/L (ref 0–53)
AST: 36 U/L (ref 0–37)
Albumin: 4.3 g/dL (ref 3.5–5.2)
Alkaline Phosphatase: 51 U/L (ref 39–117)
BILIRUBIN TOTAL: 1.1 mg/dL (ref 0.2–1.2)
Bilirubin, Direct: 0.1 mg/dL (ref 0.0–0.3)
TOTAL PROTEIN: 8.2 g/dL (ref 6.0–8.3)

## 2014-11-15 NOTE — Patient Instructions (Signed)
For itch you can take benadryl 25-50 mg up to every 4 hours  Keep cool  Update [place] if you see a rash  Call your psychiatrist and let them know that you started itching since starting ambien  In the future if you notice any timing relationship with foods or hydrocodone or other drug alert me  Liver labs today

## 2014-11-15 NOTE — Assessment & Plan Note (Signed)
Diffuse itching w/o rash Most recent new med is ambien-inst to hold it and inf his psychiatrist  Will try antihistamines and keep cool Liver panel today  Update next week   Will keep track of food/ med intake for other pot causes He does take hydrocodone - but has not reacted in the past

## 2014-11-15 NOTE — Progress Notes (Signed)
Subjective:    Patient ID: Jorge Stevens, male    DOB: [birth year], 29 y.o.   MRN: [id number]  HPI Here for itching -started tues   His air conditioning went out  Had also been working outdoors  Ate a steak at [employer] -- after that the rash got worse  Wednesday and Thursday - itching from head to toe   Has tried benadryl and claritin  No rash seen  Dr Delle Reining changed him from Trazadone to Ambien   Wed he took 3 hydrocodone in one day   Does not drink etoh  Does not take extra tylenol with the norco at all   Patient Active Problem List   Diagnosis Date Noted  . Diabetes mellitus screening 06/21/2014  . Chronic bursitis of left shoulder 04/23/2014  . Frequent loose stools 09/26/2012  . Hyperlipidemia 09/26/2012  . Hypertension, essential 12/16/2010  . Routine general medical examination at a health care facility 12/07/2010  . Anal fissure 08/13/2010  . Constipation 08/13/2010  . Sleep apnea 12/01/2008  . WISDOM TEETH EXTRACTION, HX OF 11/26/2008  . Obesity 07/28/2006   Past Medical History  Diagnosis Date  . Morbid obesity with BMI of 45.0-49.9, adult   . Unspecified sleep apnea   . Other specified personal history presenting hazards to health(V15.89)   . Other alteration of consciousness    Past Surgical History  Procedure Laterality Date  . Wisdom tooth extraction    . Tonsillectomy    . Shoulder arthroscopy w/ subacromial decompression and distal clavicle excision Left     Dr. Kristeen Miss   Social History  Substance Use Topics  . Smoking status: Former Smoker -- 1.50 packs/day for 6 years    Types: Cigarettes    Quit date: 03/16/2007  . Smokeless tobacco: Never Used  . Alcohol Use: 0.0 oz/week    0 Standard drinks or equivalent per week     Comment: Occasional   Family History  Problem Relation Age of Onset  . Hyperlipidemia Father   . Hyperlipidemia Mother   . Diabetes      grandparents  . Throat cancer      grandmother  . Lung cancer        grandfather   No Known Allergies Current Outpatient Prescriptions on File Prior to Visit  Medication Sig Dispense Refill  . acetaminophen (TYLENOL) 500 MG tablet Take 1-2 tablets (500-1,000 mg total) by mouth every 8 (eight) hours as needed for pain.    Marland Kitchen ALPRAZolam (XANAX) 1 MG tablet Take 1 mg by mouth 3 (three) times daily as needed for anxiety.    Marland Kitchen amLODipine (NORVASC) 10 MG tablet Take 1 tablet (10 mg total) by mouth daily. 90 tablet 0  . desvenlafaxine (PRISTIQ) 50 MG 24 hr tablet Take 50 mg by mouth daily.    Marland Kitchen gabapentin (NEURONTIN) 300 MG capsule Take 1 capsule (300 mg total) by mouth 3 (three) times daily. 90 capsule 11  . hydrochlorothiazide (HYDRODIURIL) 25 MG tablet TAKE 1 TABLET BY MOUTH DAILY 30 tablet 2  . HYDROcodone-acetaminophen (NORCO/VICODIN) 5-325 MG per tablet Take 1 tablet by mouth every 4 (four) hours as needed for moderate pain or severe pain (watch for sedation). 60 tablet 0  . NON FORMULARY CPAP 12 cm, full face mask     . traZODone (DESYREL) 50 MG tablet 50 mg. Take 4 by mouth at bedtime     No current facility-administered medications on file prior to visit.  Review of Systems    Review of Systems  Constitutional: Negative for fever, appetite change, fatigue and unexpected weight change.  Eyes: Negative for pain and visual disturbance.  Respiratory: Negative for cough and shortness of breath.   Cardiovascular: Negative for cp or palpitations    Gastrointestinal: Negative for nausea, diarrhea and constipation.  Genitourinary: Negative for urgency and frequency.  Skin: Negative for pallor or rash  pos for itching  Neurological: Negative for weakness, light-headedness, numbness and headaches.  Hematological: Negative for adenopathy. Does not bruise/bleed easily.  Psychiatric/Behavioral: Negative for dysphoric mood. The patient is not nervous/anxious.      Objective:   Physical Exam  Constitutional: He appears well-developed and well-nourished.  No distress.  obese and well appearing   HENT:  Head: Normocephalic and atraumatic.  Mouth/Throat: Oropharynx is clear and moist.  Eyes: Conjunctivae and EOM are normal. Pupils are equal, round, and reactive to light. Right eye exhibits no discharge. Left eye exhibits no discharge. No scleral icterus.  Neck: Normal range of motion. Neck supple.  Cardiovascular: Normal rate and regular rhythm.   Pulmonary/Chest: Effort normal and breath sounds normal. He has no wheezes.  Musculoskeletal: He exhibits no edema.  Lymphadenopathy:    He has no cervical adenopathy.  Neurological: He is alert. He displays no tremor. No cranial nerve deficit.  Skin:  No rash No excoriations No swelling or pallor or jaundice  No insect bites   Psychiatric: He has a normal mood and affect.          Assessment & Plan:   Problem List Items Addressed This Visit      Other   Pruritus - Primary    Diffuse itching w/o rash Most recent new med is ambien-inst to hold it and inf his psychiatrist  Will try antihistamines and keep cool Liver panel today  Update next week   Will keep track of food/ med intake for other pot causes He does take hydrocodone - but has not reacted in the past       Relevant Orders   Hepatic function panel

## 2014-11-20 ENCOUNTER — Other Ambulatory Visit: Payer: Self-pay | Admitting: Family Medicine

## 2014-11-20 MED ORDER — HYDROCODONE-ACETAMINOPHEN 5-325 MG PO TABS: 1.0000 | ORAL_TABLET | ORAL | Status: DC | PRN

## 2014-11-20 MED ORDER — AMLODIPINE BESYLATE 10 MG PO TABS: 10.0000 mg | ORAL_TABLET | Freq: Every day | ORAL | Status: DC

## 2014-11-20 NOTE — Telephone Encounter (Signed)
Pt notified Rx ready for pickup 

## 2014-11-20 NOTE — Telephone Encounter (Signed)
mychart request, last refilled on 10/15/14 #60 with 0 refills, please advise

## 2014-11-20 NOTE — Telephone Encounter (Signed)
Px printed for pick up in IN box  

## 2014-11-28 ENCOUNTER — Ambulatory Visit (INDEPENDENT_AMBULATORY_CARE_PROVIDER_SITE_OTHER): Payer: 59 | Admitting: Primary Care

## 2014-11-28 ENCOUNTER — Encounter: Payer: Self-pay | Admitting: Primary Care

## 2014-11-28 VITALS — BP 138/92 | HR 85 | Temp 98.3°F | Ht 73.0 in | Wt 355.0 lb

## 2014-11-28 DIAGNOSIS — R0981 Nasal congestion: Secondary | ICD-10-CM | POA: Diagnosis not present

## 2014-11-28 MED ORDER — FLUTICASONE PROPIONATE 50 MCG/ACT NA SUSP: 2.0000 | Freq: Every day | NASAL | Status: DC

## 2014-11-28 NOTE — Patient Instructions (Signed)
Try rinsing your sinuses with a Neti Pot.  Use Fluticasone nasal spray to help with nasal congestion. Instill 2 sprays in each nostril once daily.  You may take Mucinex DM if you develop any chest congestion.  Take ibuprofen for facial pain and inflammation.  Please call me if no improvement in symptoms by Monday next week.  It was a pleasure meeting you!

## 2014-11-28 NOTE — Progress Notes (Signed)
Pre visit review using our clinic review tool, if applicable. No additional management support is needed unless otherwise documented below in the visit note. 

## 2014-11-28 NOTE — Progress Notes (Signed)
Subjective:    Patient ID: Jorge Stevens, male    DOB: 1985-05-04, 29 y.o.   MRN: [id number]  HPI  Mr. Worrell is a 29 year old male who presents today with a chief complaint of nasal congestion. He also reports facial pain, and frontal sinus pressure. His symptoms have been present since Monday this week. He's been taking Dayquil without relief. Denies cough, fevers, chills.  Review of Systems  Constitutional: Negative for fever and chills.  HENT: Positive for congestion and sinus pressure. Negative for ear pain, rhinorrhea and sore throat.        Facial pressure.  Respiratory: Negative for cough and shortness of breath.   Cardiovascular: Negative for chest pain.  Musculoskeletal: Negative for myalgias.  Neurological:       Frontal headache.       Past Medical History  Diagnosis Date  . Morbid obesity with BMI of 45.0-49.9, adult   . Unspecified sleep apnea   . Other specified personal history presenting hazards to health(V15.89)   . Other alteration of consciousness     Social History   Social History  . Marital Status: Married    Spouse Name: N/A  . Number of Children: 1  . Years of Education: N/A   Occupational History  . [employer]    Social History Main Topics  . Smoking status: Former Smoker -- 1.50 packs/day for 6 years    Types: Cigarettes    Quit date: 03/16/2007  . Smokeless tobacco: Never Used  . Alcohol Use: 0.0 oz/week    0 Standard drinks or equivalent per week     Comment: Occasional  . Drug Use: No  . Sexual Activity: Not on file   Other Topics Concern  . Not on file   Social History Narrative   Married      Works at [employer], likes his job      Occasional coffee      One son 2010    Past Surgical History  Procedure Laterality Date  . Wisdom tooth extraction    . Tonsillectomy    . Shoulder arthroscopy w/ subacromial decompression and distal clavicle excision Left     Dr. Kristeen Miss    Family History  Problem  Relation Age of Onset  . Hyperlipidemia Father   . Hyperlipidemia Mother   . Diabetes      grandparents  . Throat cancer      grandmother  . Lung cancer      grandfather    No Known Allergies  Current Outpatient Prescriptions on File Prior to Visit  Medication Sig Dispense Refill  . acetaminophen (TYLENOL) 500 MG tablet Take 1-2 tablets (500-1,000 mg total) by mouth every 8 (eight) hours as needed for pain.    Marland Kitchen ALPRAZolam (XANAX) 1 MG tablet Take 1 mg by mouth 3 (three) times daily as needed for anxiety.    Marland Kitchen amLODipine (NORVASC) 10 MG tablet Take 1 tablet (10 mg total) by mouth daily. 90 tablet 1  . desvenlafaxine (PRISTIQ) 50 MG 24 hr tablet Take 50 mg by mouth daily.    Marland Kitchen gabapentin (NEURONTIN) 300 MG capsule Take 1 capsule (300 mg total) by mouth 3 (three) times daily. 90 capsule 11  . hydrochlorothiazide (HYDRODIURIL) 25 MG tablet TAKE 1 TABLET BY MOUTH DAILY 30 tablet 2  . HYDROcodone-acetaminophen (NORCO/VICODIN) 5-325 MG per tablet Take 1 tablet by mouth every 4 (four) hours as needed for moderate pain or severe pain (watch for sedation).  60 tablet 0  . NON FORMULARY CPAP 12 cm, full face mask      No current facility-administered medications on file prior to visit.    BP 138/92 mmHg  Pulse 85  Temp(Src) 98.3 F (36.8 C) (Oral)  Ht 6\' 1"  (1.854 m)  Wt 355 lb (161.027 kg)  BMI 46.85 kg/m2  SpO2 98%     Objective:   Physical Exam  Constitutional: He appears well-nourished.  HENT:  Right Ear: Tympanic membrane and ear canal normal.  Left Ear: Tympanic membrane and ear canal normal.  Nose: Right sinus exhibits maxillary sinus tenderness and frontal sinus tenderness. Left sinus exhibits maxillary sinus tenderness and frontal sinus tenderness.  Mouth/Throat: Oropharynx is clear and moist.  Eyes: Conjunctivae are normal. Pupils are equal, round, and reactive to light.  Neck: Neck supple.  Cardiovascular: Normal rate and regular rhythm.   Pulmonary/Chest: Effort  normal and breath sounds normal.  Lymphadenopathy:    He has no cervical adenopathy.  Skin: Skin is warm and dry.          Assessment & Plan:  Sinusitis:  Symptoms present for the past 3 days. Suspect viral sinusitis at this point. Tender upon palpation to frontal and maxillary sinuses. Suggested Neti Pot rinses, flonase, and ibuprofen. Follow up if no improvement by next Monday.

## 2014-12-19 ENCOUNTER — Other Ambulatory Visit: Payer: Self-pay | Admitting: Family Medicine

## 2014-12-20 ENCOUNTER — Encounter: Payer: Self-pay | Admitting: Family Medicine

## 2014-12-20 MED ORDER — HYDROCODONE-ACETAMINOPHEN 5-325 MG PO TABS: 1.0000 | ORAL_TABLET | ORAL | Status: DC | PRN

## 2014-12-20 NOTE — Telephone Encounter (Signed)
Pt notified Rx ready for pickup 

## 2014-12-20 NOTE — Addendum Note (Signed)
Addended by: Roxy Manns A on: 12/20/2014 10:12 AM   Modules accepted: Orders

## 2014-12-20 NOTE — Telephone Encounter (Signed)
See mychart message, last refilled on 11/20/14 #60 with 0 refills, please advise

## 2014-12-20 NOTE — Telephone Encounter (Signed)
Px printed for pick up in IN box  

## 2015-01-02 ENCOUNTER — Telehealth: Payer: Self-pay | Admitting: Family Medicine

## 2015-01-02 NOTE — Telephone Encounter (Signed)
Yes please do

## 2015-01-02 NOTE — Telephone Encounter (Signed)
pt called to schedule a cpx   Dr tower is out till 3/17 for cpx pt needs cpx before end on dec Can i put in sooner

## 2015-01-07 ENCOUNTER — Other Ambulatory Visit: Payer: Self-pay | Admitting: Family Medicine

## 2015-01-07 NOTE — Telephone Encounter (Signed)
Appointment 11/6 °

## 2015-01-22 ENCOUNTER — Ambulatory Visit: Payer: 59 | Admitting: Family Medicine

## 2015-01-22 DIAGNOSIS — Z0289 Encounter for other administrative examinations: Secondary | ICD-10-CM

## 2015-01-27 ENCOUNTER — Other Ambulatory Visit: Payer: Self-pay | Admitting: Family Medicine

## 2015-01-27 MED ORDER — HYDROCODONE-ACETAMINOPHEN 5-325 MG PO TABS: 1.0000 | ORAL_TABLET | ORAL | Status: DC | PRN

## 2015-01-27 NOTE — Telephone Encounter (Signed)
Px printed for pick up in IN box  

## 2015-01-27 NOTE — Telephone Encounter (Signed)
Pt notified Rx ready for pickup 

## 2015-02-03 ENCOUNTER — Ambulatory Visit: Payer: 59 | Admitting: Adult Health

## 2015-02-24 ENCOUNTER — Other Ambulatory Visit: Payer: Self-pay | Admitting: Family Medicine

## 2015-02-25 MED ORDER — HYDROCODONE-ACETAMINOPHEN 5-325 MG PO TABS: 1.0000 | ORAL_TABLET | ORAL | Status: DC | PRN

## 2015-02-25 NOTE — Telephone Encounter (Signed)
Px printed for pick up in IN box  

## 2015-02-25 NOTE — Telephone Encounter (Signed)
Pt's son was seen by Dr. Milinda Antisower today and Rx was given to wife

## 2015-03-23 ENCOUNTER — Telehealth: Payer: Self-pay | Admitting: Family Medicine

## 2015-03-23 DIAGNOSIS — R739 Hyperglycemia, unspecified: Secondary | ICD-10-CM

## 2015-03-23 DIAGNOSIS — Z0000 Encounter for general adult medical examination without abnormal findings: Secondary | ICD-10-CM

## 2015-03-23 DIAGNOSIS — R7303 Prediabetes: Secondary | ICD-10-CM | POA: Insufficient documentation

## 2015-03-23 NOTE — Telephone Encounter (Signed)
-----   Message from Natasha C Chavers sent at 03/18/2015  8:51 AM EST ----- Regarding: cpx labs Mon 1/9, need orders. Thanks! :-) Please order  future cpx labs for pt's upcoming lab appt. Thanks Tasha  

## 2015-03-24 ENCOUNTER — Other Ambulatory Visit: Payer: 59

## 2015-03-27 ENCOUNTER — Other Ambulatory Visit (INDEPENDENT_AMBULATORY_CARE_PROVIDER_SITE_OTHER): Payer: BLUE CROSS/BLUE SHIELD

## 2015-03-27 DIAGNOSIS — R739 Hyperglycemia, unspecified: Secondary | ICD-10-CM | POA: Diagnosis not present

## 2015-03-27 DIAGNOSIS — Z0000 Encounter for general adult medical examination without abnormal findings: Secondary | ICD-10-CM | POA: Diagnosis not present

## 2015-03-27 LAB — CBC WITH DIFFERENTIAL/PLATELET
Basophils Absolute: 0 10*3/uL (ref 0.0–0.1)
Basophils Relative: 0.8 % (ref 0.0–3.0)
EOS PCT: 2.4 % (ref 0.0–5.0)
Eosinophils Absolute: 0.1 10*3/uL (ref 0.0–0.7)
HCT: 44.4 % (ref 39.0–52.0)
Hemoglobin: 14.6 g/dL (ref 13.0–17.0)
LYMPHS ABS: 2.3 10*3/uL (ref 0.7–4.0)
Lymphocytes Relative: 37.6 % (ref 12.0–46.0)
MCHC: 32.8 g/dL (ref 30.0–36.0)
MCV: 84.7 fl (ref 78.0–100.0)
MONOS PCT: 6.5 % (ref 3.0–12.0)
Monocytes Absolute: 0.4 10*3/uL (ref 0.1–1.0)
NEUTROS ABS: 3.2 10*3/uL (ref 1.4–7.7)
NEUTROS PCT: 52.7 % (ref 43.0–77.0)
Platelets: 451 10*3/uL — ABNORMAL HIGH (ref 150.0–400.0)
RBC: 5.24 Mil/uL (ref 4.22–5.81)
RDW: 13.4 % (ref 11.5–15.5)
WBC: 6.2 10*3/uL (ref 4.0–10.5)

## 2015-03-27 LAB — COMPREHENSIVE METABOLIC PANEL
ALBUMIN: 4.4 g/dL (ref 3.5–5.2)
ALK PHOS: 48 U/L (ref 39–117)
ALT: 48 U/L (ref 0–53)
AST: 31 U/L (ref 0–37)
BUN: 10 mg/dL (ref 6–23)
CALCIUM: 10 mg/dL (ref 8.4–10.5)
CO2: 30 mEq/L (ref 19–32)
Chloride: 105 mEq/L (ref 96–112)
Creatinine, Ser: 1.07 mg/dL (ref 0.40–1.50)
GFR: 103.65 mL/min (ref >60.00)
GLUCOSE: 90 mg/dL (ref 70–99)
POTASSIUM: 4.3 meq/L (ref 3.5–5.1)
Sodium: 140 mEq/L (ref 135–145)
TOTAL PROTEIN: 7.7 g/dL (ref 6.0–8.3)
Total Bilirubin: 0.8 mg/dL (ref 0.2–1.2)

## 2015-03-27 LAB — LIPID PANEL
CHOLESTEROL: 179 mg/dL (ref 0–200)
HDL: 33.9 mg/dL — AB (ref >39.00)
LDL Cholesterol: 118 mg/dL — ABNORMAL HIGH (ref 0–99)
NonHDL: 144.72
TRIGLYCERIDES: 132 mg/dL (ref 0.0–149.0)
Total CHOL/HDL Ratio: 5
VLDL: 26.4 mg/dL (ref 0.0–40.0)

## 2015-03-27 LAB — HEMOGLOBIN A1C: HEMOGLOBIN A1C: 5.7 % (ref 4.6–6.5)

## 2015-03-27 LAB — TSH: TSH: 0.69 u[IU]/mL (ref 0.35–4.50)

## 2015-03-31 ENCOUNTER — Ambulatory Visit (INDEPENDENT_AMBULATORY_CARE_PROVIDER_SITE_OTHER): Payer: BLUE CROSS/BLUE SHIELD | Admitting: Family Medicine

## 2015-03-31 ENCOUNTER — Encounter: Payer: Self-pay | Admitting: Family Medicine

## 2015-03-31 VITALS — BP 132/68 | HR 82 | Temp 98.9°F | Ht 72.0 in | Wt 349.0 lb

## 2015-03-31 DIAGNOSIS — Z23 Encounter for immunization: Secondary | ICD-10-CM

## 2015-03-31 DIAGNOSIS — I10 Essential (primary) hypertension: Secondary | ICD-10-CM | POA: Diagnosis not present

## 2015-03-31 DIAGNOSIS — Z114 Encounter for screening for human immunodeficiency virus [HIV]: Secondary | ICD-10-CM | POA: Insufficient documentation

## 2015-03-31 DIAGNOSIS — R739 Hyperglycemia, unspecified: Secondary | ICD-10-CM

## 2015-03-31 DIAGNOSIS — E785 Hyperlipidemia, unspecified: Secondary | ICD-10-CM

## 2015-03-31 DIAGNOSIS — E669 Obesity, unspecified: Secondary | ICD-10-CM

## 2015-03-31 DIAGNOSIS — Z0000 Encounter for general adult medical examination without abnormal findings: Secondary | ICD-10-CM

## 2015-03-31 NOTE — Patient Instructions (Addendum)
Lab for HIV screen today  Stay off the amlodipine for now for blood pressure and we will watch it closely Keep exercising Keep loosing weight  To increase HDL cholesterol - eat fish (with fins) and also keep exercising  Flu shot today

## 2015-03-31 NOTE — Assessment & Plan Note (Signed)
Choosing to hold his amlodipine because bp is holding without it - and exercising/loosing wt Will re check in 6 mo  BP: 132/68 mmHg

## 2015-03-31 NOTE — Progress Notes (Signed)
Subjective:    Patient ID: Jorge Stevens, male    DOB: 04/02/1985, 30 y.o.   MRN: [id number]  HPI Here for health maintenance exam and to review chronic medical problems    Wt is down 6 lb with bmi of 47 Morbid obese category Is working on weight loss Has cut out fried foods Eats more fruits  No sweetened drinks  Is in a competition at work- enrolled in this  Is working out now too - using the elliptical (45 min) and push ups and abd roller/ jump rope    HIV screening - is ok with that -  Flu shot = will get today   Td 9/10   bp is stable today (some improvement)- stopped his bp med 2 weeks ago -has not checked bp at home  Did not have side effects (he just did not have ins for a while)  No cp or palpitations or headaches or edema  No side effects to medicines  BP Readings from Last 3 Encounters:  03/31/15 132/68  11/28/14 138/92  11/15/14 122/66        Chemistry      Component Value Date/Time   NA 140 03/27/2015 1015   K 4.3 03/27/2015 1015   CL 105 03/27/2015 1015   CO2 30 03/27/2015 1015   BUN 10 03/27/2015 1015   CREATININE 1.07 03/27/2015 1015      Component Value Date/Time   CALCIUM 10.0 03/27/2015 1015   ALKPHOS 48 03/27/2015 1015   AST 31 03/27/2015 1015   ALT 48 03/27/2015 1015   BILITOT 0.8 03/27/2015 1015      Hyperlipidemia Lab Results  Component Value Date   CHOL 179 03/27/2015   CHOL 229* 06/21/2014   CHOL 205* 11/24/2012   Lab Results  Component Value Date   HDL 33.90* 03/27/2015   HDL 43.00 06/21/2014   HDL 31.40* 11/24/2012   Lab Results  Component Value Date   LDLCALC 118* 03/27/2015   LDLCALC 159* 06/21/2014   LDLCALC 119* 12/15/2010   Lab Results  Component Value Date   TRIG 132.0 03/27/2015   TRIG 136.0 06/21/2014   TRIG 136.0 11/24/2012   Lab Results  Component Value Date   CHOLHDL 5 03/27/2015   CHOLHDL 5 06/21/2014   CHOLHDL 7 11/24/2012   Lab Results  Component Value Date   LDLDIRECT 156.3 11/24/2012     LDLDIRECT 188.3 09/26/2012   LDLDIRECT 167.8 11/20/2008  improved - with giving up fried foods HDL low-is exercising - better diet  Likes fish -can eat more   Hyperglycemia Lab Results  Component Value Date   HGBA1C 5.7 03/27/2015   Mental health/mood  A lot better since he changed jobs - left [employer]  On medication   Shoulder stays sore - not as bad as in the past   Patient Active Problem List   Diagnosis Date Noted  . Screening for HIV (human immunodeficiency virus) 03/31/2015  . Hyperglycemia 03/23/2015  . Pruritus 11/15/2014  . Diabetes mellitus screening 06/21/2014  . Chronic bursitis of left shoulder 04/23/2014  . Frequent loose stools 09/26/2012  . Hyperlipidemia 09/26/2012  . Hypertension, essential 12/16/2010  . Routine general medical examination at a health care facility 12/07/2010  . Anal fissure 08/13/2010  . Constipation 08/13/2010  . Sleep apnea 12/01/2008  . WISDOM TEETH EXTRACTION, HX OF 11/26/2008  . Obesity 07/28/2006   Past Medical History  Diagnosis Date  . Morbid obesity with BMI of 45.0-49.9, adult (HCC)   .  Unspecified sleep apnea   . Other specified personal history presenting hazards to health(V15.89)   . Other alteration of consciousness    Past Surgical History  Procedure Laterality Date  . Wisdom tooth extraction    . Tonsillectomy    . Shoulder arthroscopy w/ subacromial decompression and distal clavicle excision Left     Dr. Kristeen Miss   Social History  Substance Use Topics  . Smoking status: Former Smoker -- 1.50 packs/day for 6 years    Types: Cigarettes    Quit date: 03/16/2007  . Smokeless tobacco: Never Used  . Alcohol Use: 0.0 oz/week    0 Standard drinks or equivalent per week     Comment: Occasional   Family History  Problem Relation Age of Onset  . Hyperlipidemia Father   . Hyperlipidemia Mother   . Diabetes      grandparents  . Throat cancer      grandmother  . Lung cancer      grandfather   No Known  Allergies Current Outpatient Prescriptions on File Prior to Visit  Medication Sig Dispense Refill  . acetaminophen (TYLENOL) 500 MG tablet Take 1-2 tablets (500-1,000 mg total) by mouth every 8 (eight) hours as needed for pain.    Marland Kitchen ALPRAZolam (XANAX) 1 MG tablet Take 1 mg by mouth 3 (three) times daily as needed for anxiety.    Marland Kitchen amLODipine (NORVASC) 10 MG tablet Take 1 tablet (10 mg total) by mouth daily. 90 tablet 1  . desvenlafaxine (PRISTIQ) 50 MG 24 hr tablet Take 50 mg by mouth daily.    . fluticasone (FLONASE) 50 MCG/ACT nasal spray Place 2 sprays into both nostrils daily. 16 g 1  . hydrochlorothiazide (HYDRODIURIL) 25 MG tablet TAKE 1 TABLET BY MOUTH DAILY 30 tablet 2  . HYDROcodone-acetaminophen (NORCO/VICODIN) 5-325 MG tablet Take 1 tablet by mouth every 4 (four) hours as needed for moderate pain or severe pain (watch for sedation). 60 tablet 0  . NON FORMULARY CPAP 12 cm, full face mask      No current facility-administered medications on file prior to visit.    Review of Systems Review of Systems  Constitutional: Negative for fever, appetite change, fatigue and unexpected weight change.  Eyes: Negative for pain and visual disturbance.  Respiratory: Negative for cough and shortness of breath.   Cardiovascular: Negative for cp or palpitations    Gastrointestinal: Negative for nausea, diarrhea and constipation.  Genitourinary: Negative for urgency and frequency.  Skin: Negative for pallor or rash   MSK pos for chronic shoulder pain that is slt improved  Neurological: Negative for weakness, light-headedness, numbness and headaches.  Hematological: Negative for adenopathy. Does not bruise/bleed easily.  Psychiatric/Behavioral: Negative for dysphoric mood. The patient is not nervous/anxious.  pos for improvement in mood        Objective:   Physical Exam  Constitutional: He appears well-developed and well-nourished. No distress.  Morbidly obese and well appearing   HENT:    Head: Normocephalic and atraumatic.  Right Ear: External ear normal.  Left Ear: External ear normal.  Nose: Nose normal.  Mouth/Throat: Oropharynx is clear and moist.  Eyes: Conjunctivae and EOM are normal. Pupils are equal, round, and reactive to light. Right eye exhibits no discharge. Left eye exhibits no discharge. No scleral icterus.  Neck: Normal range of motion. Neck supple. No JVD present. Carotid bruit is not present. No thyromegaly present.  Cardiovascular: Normal rate, regular rhythm, normal heart sounds and intact distal pulses.  Exam reveals  no gallop.   Pulmonary/Chest: Effort normal and breath sounds normal. No respiratory distress. He has no wheezes. He exhibits no tenderness.  Abdominal: Soft. Bowel sounds are normal. He exhibits no distension, no abdominal bruit and no mass. There is no tenderness.  Musculoskeletal: He exhibits no edema or tenderness.  Lymphadenopathy:    He has no cervical adenopathy.  Neurological: He is alert. He has normal reflexes. No cranial nerve deficit. He exhibits normal muscle tone. Coordination normal.  Skin: Skin is warm and dry. No rash noted. No erythema. No pallor.  Psychiatric: He has a normal mood and affect.  Cheerful today          Assessment & Plan:   Problem List Items Addressed This Visit      Cardiovascular and Mediastinum   Hypertension, essential    Choosing to hold his amlodipine because bp is holding without it - and exercising/loosing wt Will re check in 6 mo  BP: 132/68 mmHg          Other   Hyperglycemia    Lab Results  Component Value Date   HGBA1C 5.7 03/27/2015   Enc low glycemic diet and wt loss to prevent DM  Doing well with new effort so far-commended       Hyperlipidemia    bp is fairly stable off amlodpine with less stress/ beginning of wt loss and exercise  Wishes to stay off amlodipine and watch it  Disc DASH eating plan- watching sodium/processed foods in diet       Obesity    Discussed  how this problem influences overall health and the risks it imposes  Reviewed plan for weight loss with lower calorie diet (via better food choices and also portion control or program like weight watchers) and exercise building up to or more than 30 minutes 5 days per week including some aerobic activity   Commended good work/wt loss so far       Routine general medical examination at a health care facility - Primary    Reviewed health habits including diet and exercise and skin cancer prevention Reviewed appropriate screening tests for age  Also reviewed health mt list, fam hx and immunization status , as well as social and family history   See HPI Labs reviewed-improved cholesterol! Enc further wt loss  Flu shot today HIV screen today Form for work filled out today      Screening for HIV (human immunodeficiency virus)    HIV screen today No risk factors or symptoms       Relevant Orders   HIV antibody (with reflex)    Other Visit Diagnoses    Need for influenza vaccination        Relevant Orders    Flu Vaccine QUAD 36+ mos PF IM (Fluarix & Fluzone Quad PF) (Completed)

## 2015-03-31 NOTE — Assessment & Plan Note (Addendum)
bp is fairly stable off amlodpine with less stress/ beginning of wt loss and exercise  Wishes to stay off amlodipine and watch it  Disc DASH eating plan- watching sodium/processed foods in diet

## 2015-03-31 NOTE — Assessment & Plan Note (Signed)
Reviewed health habits including diet and exercise and skin cancer prevention Reviewed appropriate screening tests for age  Also reviewed health mt list, fam hx and immunization status , as well as social and family history   See HPI Labs reviewed-improved cholesterol! Enc further wt loss  Flu shot today HIV screen today Form for work filled out today

## 2015-03-31 NOTE — Assessment & Plan Note (Signed)
Lab Results  Component Value Date   HGBA1C 5.7 03/27/2015   Enc low glycemic diet and wt loss to prevent DM  Doing well with new effort so far-commended

## 2015-03-31 NOTE — Assessment & Plan Note (Signed)
Discussed how this problem influences overall health and the risks it imposes  Reviewed plan for weight loss with lower calorie diet (via better food choices and also portion control or program like weight watchers) and exercise building up to or more than 30 minutes 5 days per week including some aerobic activity   Commended good work/wt loss so far

## 2015-03-31 NOTE — Progress Notes (Signed)
Pre visit review using our clinic review tool, if applicable. No additional management support is needed unless otherwise documented below in the visit note. 

## 2015-03-31 NOTE — Assessment & Plan Note (Signed)
HIV screen today No risk factors or symptoms

## 2015-04-01 LAB — HIV ANTIBODY (ROUTINE TESTING W REFLEX): HIV: NONREACTIVE

## 2015-04-11 ENCOUNTER — Other Ambulatory Visit: Payer: Self-pay | Admitting: Family Medicine

## 2015-04-11 NOTE — Telephone Encounter (Signed)
Last filled 02/25/2015--please advise

## 2015-04-11 NOTE — Telephone Encounter (Signed)
Px printed for pick up in IN box  

## 2015-04-14 MED ORDER — HYDROCODONE-ACETAMINOPHEN 5-325 MG PO TABS: 1.0000 | ORAL_TABLET | ORAL | Status: DC | PRN

## 2015-04-14 NOTE — Telephone Encounter (Signed)
Done now - thanks!

## 2015-04-14 NOTE — Telephone Encounter (Signed)
Rx was never printed, please advise

## 2015-04-14 NOTE — Telephone Encounter (Signed)
Pt notified Rx ready for pickup 

## 2015-05-07 ENCOUNTER — Telehealth: Payer: Self-pay | Admitting: Family Medicine

## 2015-05-07 ENCOUNTER — Ambulatory Visit (INDEPENDENT_AMBULATORY_CARE_PROVIDER_SITE_OTHER): Payer: BLUE CROSS/BLUE SHIELD | Admitting: Family Medicine

## 2015-05-07 ENCOUNTER — Ambulatory Visit: Payer: BLUE CROSS/BLUE SHIELD | Admitting: Family Medicine

## 2015-05-07 ENCOUNTER — Encounter: Payer: Self-pay | Admitting: Family Medicine

## 2015-05-07 VITALS — BP 135/85 | HR 90 | Temp 98.6°F | Ht 72.0 in | Wt 338.8 lb

## 2015-05-07 DIAGNOSIS — M7500 Adhesive capsulitis of unspecified shoulder: Secondary | ICD-10-CM | POA: Insufficient documentation

## 2015-05-07 DIAGNOSIS — M7502 Adhesive capsulitis of left shoulder: Secondary | ICD-10-CM | POA: Diagnosis not present

## 2015-05-07 DIAGNOSIS — M7552 Bursitis of left shoulder: Secondary | ICD-10-CM | POA: Diagnosis not present

## 2015-05-07 MED ORDER — HYDROCODONE-ACETAMINOPHEN 5-325 MG PO TABS: 1.0000 | ORAL_TABLET | ORAL | Status: DC | PRN

## 2015-05-07 NOTE — Patient Instructions (Signed)
Go ahead and take hydrocodone 1-2 pills up to every 4 hours with food  If you get constipated- you can try a stool softener  Try ice or heat- whatever feels better I wonder if you could have a frozen shoulder- ? Not sure what caused it or why pain is so much worse suddenly  I will place an urgent referral to Dr Rondel Baton office and our office will call you

## 2015-05-07 NOTE — Progress Notes (Signed)
Pre visit review using our clinic review tool, if applicable. No additional management support is needed unless otherwise documented below in the visit note. 

## 2015-05-07 NOTE — Telephone Encounter (Signed)
Refill of pain med  Please call him Thurs and let him know it is ready

## 2015-05-07 NOTE — Progress Notes (Signed)
Subjective:    Patient ID: Jorge Stevens, male    DOB: [birth year], 30 y.o.   MRN: [id number]  HPI Here for shoulder pain  He is averaging 1-2 pills every 4 hours - and it is still not helping   Hurt worse on Friday- and getting worse by the day  Just woke up with it and he could not move it  ? Frozen shoulder  Is popping now  A significant change  Sleeps on back because of cpap   Exercise is cardio only - keeps his hands stationary   On pain scale is a 9/10  No swelling or redness   Prev to Friday had a normal range of movement before Friday  Now cannot abduct more than 10-20 degrees from his body Most comfortable to hold close to body  Pain is radiating - to arm and hand  Making a fist runs pain up to his shoulder   Patient Active Problem List   Diagnosis Date Noted  . Frozen shoulder 05/07/2015  . Screening for HIV (human immunodeficiency virus) 03/31/2015  . Hyperglycemia 03/23/2015  . Pruritus 11/15/2014  . Diabetes mellitus screening 06/21/2014  . Chronic bursitis of left shoulder 04/23/2014  . Frequent loose stools 09/26/2012  . Hyperlipidemia 09/26/2012  . Hypertension, essential 12/16/2010  . Routine general medical examination at a health care facility 12/07/2010  . Anal fissure 08/13/2010  . Constipation 08/13/2010  . Sleep apnea 12/01/2008  . WISDOM TEETH EXTRACTION, HX OF 11/26/2008  . Obesity 07/28/2006   Past Medical History  Diagnosis Date  . Morbid obesity with BMI of 45.0-49.9, adult (HCC)   . Unspecified sleep apnea   . Other specified personal history presenting hazards to health(V15.89)   . Other alteration of consciousness    Past Surgical History  Procedure Laterality Date  . Wisdom tooth extraction    . Tonsillectomy    . Shoulder arthroscopy w/ subacromial decompression and distal clavicle excision Left     Dr. Kristeen Miss   Social History  Substance Use Topics  . Smoking status: Former Smoker -- 1.50 packs/day for 6 years    Types: Cigarettes    Quit date: 03/16/2007  . Smokeless tobacco: Never Used  . Alcohol Use: 0.0 oz/week    0 Standard drinks or equivalent per week     Comment: Occasional   Family History  Problem Relation Age of Onset  . Hyperlipidemia Father   . Hyperlipidemia Mother   . Diabetes      grandparents  . Throat cancer      grandmother  . Lung cancer      grandfather   No Known Allergies Current Outpatient Prescriptions on File Prior to Visit  Medication Sig Dispense Refill  . acetaminophen (TYLENOL) 500 MG tablet Take 1-2 tablets (500-1,000 mg total) by mouth every 8 (eight) hours as needed for pain.    Marland Kitchen ALPRAZolam (XANAX) 1 MG tablet Take 1 mg by mouth 3 (three) times daily as needed for anxiety.    Marland Kitchen amLODipine (NORVASC) 10 MG tablet Take 1 tablet (10 mg total) by mouth daily. 90 tablet 1  . desvenlafaxine (PRISTIQ) 50 MG 24 hr tablet Take 50 mg by mouth daily.    . fluticasone (FLONASE) 50 MCG/ACT nasal spray Place 2 sprays into both nostrils daily. 16 g 1  . hydrochlorothiazide (HYDRODIURIL) 25 MG tablet TAKE 1 TABLET BY MOUTH DAILY 30 tablet 2  . NON FORMULARY CPAP 12 cm, full face mask  No current facility-administered medications on file prior to visit.    Review of Systems    Review of Systems  Constitutional: Negative for fever, appetite change, fatigue and unexpected weight change.  Eyes: Negative for pain and visual disturbance.  Respiratory: Negative for cough and shortness of breath.   Cardiovascular: Negative for cp or palpitations    Gastrointestinal: Negative for nausea, diarrhea and constipation.  Genitourinary: Negative for urgency and frequency.  Skin: Negative for pallor or rash   MSK pos for severe pain in L shoulder with limited rom  Neurological: Negative for weakness, light-headedness, numbness and headaches.  Hematological: Negative for adenopathy. Does not bruise/bleed easily.  Psychiatric/Behavioral: Negative for dysphoric mood. The patient  is not nervous/anxious.      Objective:   Physical Exam  Constitutional: He appears well-developed and well-nourished. No distress.  obese and uncomfortable appearing   HENT:  Head: Normocephalic and atraumatic.  Eyes: Conjunctivae and EOM are normal. Pupils are equal, round, and reactive to light.  Cardiovascular: Normal rate and regular rhythm.   Pulmonary/Chest: Effort normal and breath sounds normal.  Musculoskeletal:       Left shoulder: He exhibits decreased range of motion, tenderness and bony tenderness. He exhibits no swelling, no effusion, no crepitus, no deformity and normal pulse.  Limited rom of L arm -abduct 10 deg/ adduct 5 deg Flex 5-10 deg and ext none without pain  Cannot int or ext rotate  Tender over ac joint  No bicep tenderness No edema or ecchymosis   Nl grip - though this causes pain up his arm   Neurological: He is alert. He has normal reflexes. He displays no atrophy. No cranial nerve deficit or sensory deficit. He exhibits normal muscle tone. Coordination normal.  Skin: Skin is warm and dry. No rash noted. No erythema.  Psychiatric: He has a normal mood and affect.          Assessment & Plan:   Problem List Items Addressed This Visit      Musculoskeletal and Integument   Chronic bursitis of left shoulder    Sudden worsening of shoulder pain with immobility/symptoms of frozen shoulder inst he can take norco 1-2 every 4 hours with caution of sedation Work note Urgent ref to orthopedics- ? Frozen shoulder or new injury       Relevant Orders   Ambulatory referral to Orthopedic Surgery   Frozen shoulder - Primary   Relevant Orders   Ambulatory referral to Orthopedic Surgery

## 2015-05-08 ENCOUNTER — Telehealth: Payer: Self-pay | Admitting: Family Medicine

## 2015-05-08 NOTE — Telephone Encounter (Signed)
Pt saw [facility] and will be doing surgery on Tuesday  cb number [phone] or you can mychart   Thanks

## 2015-05-08 NOTE — Telephone Encounter (Signed)
Pt notified Rx ready for pickup 

## 2015-05-08 NOTE — Telephone Encounter (Signed)
Thanks for making me aware- hope this will help !

## 2015-05-08 NOTE — Assessment & Plan Note (Signed)
Sudden worsening of shoulder pain with immobility/symptoms of frozen shoulder inst he can take norco 1-2 every 4 hours with caution of sedation Work note Urgent ref to orthopedics- ? Frozen shoulder or new injury

## 2015-05-09 ENCOUNTER — Ambulatory Visit: Payer: BLUE CROSS/BLUE SHIELD | Admitting: Family Medicine

## 2015-05-09 ENCOUNTER — Encounter: Payer: Self-pay | Admitting: Family Medicine

## 2015-05-13 ENCOUNTER — Telehealth: Payer: Self-pay | Admitting: Family Medicine

## 2015-05-13 NOTE — Telephone Encounter (Signed)
Aware- since he just rec hydrocodone they will not fill it - he will have to check with his ortho office if he feels he needs the percocet instead so they can clarify with the pharmacy

## 2015-05-13 NOTE — Telephone Encounter (Signed)
Jorge Stevens (spouse )called  Pt has surgery today They gave have a rx for percet  [facility] will not fill because of contract with dr tower They told him  to take what dr tower prescribed She wants to know what she needs to do this that rx does help with pain

## 2015-05-14 ENCOUNTER — Ambulatory Visit: Payer: 59 | Admitting: Pulmonary Disease

## 2015-05-14 NOTE — Telephone Encounter (Signed)
Tanisha (Spouse) advise of Dr. Royden Purl comments. She will call ortho and see if they can override the Rx if the bring the hydrocodone Rx back

## 2015-05-27 ENCOUNTER — Encounter: Payer: Self-pay | Admitting: Family Medicine

## 2015-06-26 ENCOUNTER — Other Ambulatory Visit: Payer: Self-pay | Admitting: Family Medicine

## 2015-06-26 MED ORDER — HYDROCODONE-ACETAMINOPHEN 5-325 MG PO TABS: 1.0000 | ORAL_TABLET | ORAL | Status: DC | PRN

## 2015-06-26 NOTE — Telephone Encounter (Signed)
Printed and in Kim's box 

## 2015-06-26 NOTE — Telephone Encounter (Signed)
See mychart message Dr. Milinda Antisower out of office last refilled on 05/07/15 #120 tabs with 0 refills

## 2015-07-07 ENCOUNTER — Other Ambulatory Visit: Payer: Self-pay | Admitting: *Deleted

## 2015-07-07 MED ORDER — HYDROCHLOROTHIAZIDE 25 MG PO TABS: 25.0000 mg | ORAL_TABLET | Freq: Every day | ORAL | Status: DC

## 2015-07-13 ENCOUNTER — Other Ambulatory Visit: Payer: Self-pay | Admitting: Internal Medicine

## 2015-07-29 ENCOUNTER — Other Ambulatory Visit: Payer: Self-pay | Admitting: Family Medicine

## 2015-07-30 NOTE — Telephone Encounter (Signed)
Left voicemail requesting pt to call office back or send us a mychart message letting us know Dr. Royden Purlower's questions

## 2015-07-30 NOTE — Telephone Encounter (Signed)
Ok to refill? Last filled 06/26/15 

## 2015-07-30 NOTE — Telephone Encounter (Signed)
Before I refill please ask him how much pain he is having and on average how many pills he finds he needs daily  Thanks

## 2015-08-01 ENCOUNTER — Other Ambulatory Visit: Payer: Self-pay | Admitting: *Deleted

## 2015-08-01 ENCOUNTER — Encounter: Payer: Self-pay | Admitting: Family Medicine

## 2015-08-01 MED ORDER — HYDROCODONE-ACETAMINOPHEN 5-325 MG PO TABS: 1.0000 | ORAL_TABLET | ORAL | Status: DC | PRN

## 2015-08-01 MED ORDER — AMLODIPINE BESYLATE 10 MG PO TABS: 10.0000 mg | ORAL_TABLET | Freq: Every day | ORAL | Status: DC

## 2015-08-01 NOTE — Telephone Encounter (Signed)
Pt sent a mychart message back, he said his pain varies so he is taking 1-2 pills a day, it just depends on the amount of pain he is in

## 2015-08-01 NOTE — Telephone Encounter (Signed)
Sent pt a mychart message letting him know Rx ready for pickup

## 2015-08-01 NOTE — Addendum Note (Signed)
Addended by: Roxy MannsWER, MARNE A on: 08/01/2015 11:03 AM   Modules accepted: Orders

## 2015-08-01 NOTE — Telephone Encounter (Signed)
Since he takes a max of 2 pills per day now- will reduce # to 60 pills

## 2015-08-01 NOTE — Telephone Encounter (Signed)
Px printed for pick up in IN box  

## 2015-08-01 NOTE — Addendum Note (Signed)
Addended by: Shon MilletWATLINGTON, Chevie Birkhead M on: 08/01/2015 10:38 AM   Modules accepted: Orders

## 2015-08-22 ENCOUNTER — Ambulatory Visit: Payer: BLUE CROSS/BLUE SHIELD | Admitting: Pulmonary Disease

## 2015-08-27 ENCOUNTER — Telehealth: Payer: Self-pay | Admitting: Family Medicine

## 2015-08-27 ENCOUNTER — Encounter: Payer: Self-pay | Admitting: Family Medicine

## 2015-08-27 DIAGNOSIS — M7502 Adhesive capsulitis of left shoulder: Secondary | ICD-10-CM

## 2015-08-27 DIAGNOSIS — M7552 Bursitis of left shoulder: Secondary | ICD-10-CM

## 2015-08-27 MED ORDER — HYDROCODONE-ACETAMINOPHEN 5-325 MG PO TABS: 1.0000 | ORAL_TABLET | ORAL | Status: DC | PRN

## 2015-08-27 NOTE — Telephone Encounter (Signed)
Refill of pain medication  I will let pt know it is ready on mychart I am going up to 80 pills per month due to inc pain and also ref him to integrative therapies for chronic pain  [facility]

## 2015-08-28 NOTE — Telephone Encounter (Signed)
Rx placed at front desk for pick up.  

## 2015-09-10 DIAGNOSIS — M25522 Pain in left elbow: Secondary | ICD-10-CM | POA: Diagnosis not present

## 2015-09-19 ENCOUNTER — Encounter: Payer: Self-pay | Admitting: Family Medicine

## 2015-09-19 DIAGNOSIS — M25522 Pain in left elbow: Secondary | ICD-10-CM | POA: Diagnosis not present

## 2015-09-19 NOTE — Telephone Encounter (Signed)
Pt left v/m requesting status of muscle relaxant; pt contact # [phone].

## 2015-09-22 ENCOUNTER — Telehealth: Payer: Self-pay | Admitting: Family Medicine

## 2015-09-22 MED ORDER — CYCLOBENZAPRINE HCL 10 MG PO TABS: 5.0000 mg | ORAL_TABLET | Freq: Three times a day (TID) | ORAL | Status: DC | PRN

## 2015-09-22 NOTE — Telephone Encounter (Signed)
[facility]eril sent to [facility] -see mychart note

## 2015-09-23 DIAGNOSIS — M25522 Pain in left elbow: Secondary | ICD-10-CM | POA: Diagnosis not present

## 2015-09-25 ENCOUNTER — Encounter: Payer: Self-pay | Admitting: Family Medicine

## 2015-09-25 DIAGNOSIS — M25522 Pain in left elbow: Secondary | ICD-10-CM | POA: Diagnosis not present

## 2015-09-26 ENCOUNTER — Telehealth: Payer: Self-pay | Admitting: Family Medicine

## 2015-09-26 MED ORDER — HYDROCODONE-ACETAMINOPHEN 5-325 MG PO TABS: 1.0000 | ORAL_TABLET | ORAL | Status: DC | PRN

## 2015-09-26 NOTE — Telephone Encounter (Signed)
Pain med refill  Please call pt and let him know it is ready

## 2015-09-26 NOTE — Telephone Encounter (Signed)
Left voicemail letting pt know Rx ready for pick up 

## 2015-09-30 DIAGNOSIS — M25522 Pain in left elbow: Secondary | ICD-10-CM | POA: Diagnosis not present

## 2015-10-10 DIAGNOSIS — M25522 Pain in left elbow: Secondary | ICD-10-CM | POA: Diagnosis not present

## 2015-10-15 DIAGNOSIS — M7502 Adhesive capsulitis of left shoulder: Secondary | ICD-10-CM | POA: Diagnosis not present

## 2015-10-15 DIAGNOSIS — M25522 Pain in left elbow: Secondary | ICD-10-CM | POA: Diagnosis not present

## 2015-10-22 ENCOUNTER — Encounter: Payer: Self-pay | Admitting: Family Medicine

## 2015-10-24 MED ORDER — HYDROCODONE-ACETAMINOPHEN 5-325 MG PO TABS: 1.0000 | ORAL_TABLET | ORAL | 0 refills | Status: DC | PRN

## 2015-10-24 NOTE — Telephone Encounter (Signed)
Please let pt know his pain medication is ready for pick up

## 2015-11-10 ENCOUNTER — Encounter: Payer: Self-pay | Admitting: Family Medicine

## 2015-11-12 MED ORDER — HYDROCODONE-ACETAMINOPHEN 5-325 MG PO TABS: 1.0000 | ORAL_TABLET | ORAL | 0 refills | Status: DC | PRN

## 2015-11-12 NOTE — Telephone Encounter (Signed)
Please let pt know I wrote for 120 pills this time of norco- will put in IN box for pick up  Please call for last ortho note (I think Dr Lajoyce Cornersuda)- I do not have his post op visit note or anything after that  I think we should start considering a referral to a pain clinic in light of his increasing need for pain medication - please ask him about that  Thanks

## 2015-11-13 NOTE — Telephone Encounter (Addendum)
See pt's mychart message about pain clinic, I called for last OV note from Dr. Lajoyce Cornersuda and pt responded via mychart about pain management, and left voicemail letting pt know Rx ready for pickup

## 2015-11-18 ENCOUNTER — Telehealth: Payer: Self-pay | Admitting: Family Medicine

## 2015-11-18 NOTE — Telephone Encounter (Signed)
Pt pt mychart message letting him know Dr. Royden Purlower's comments

## 2015-11-18 NOTE — Telephone Encounter (Signed)
Please let pt know that I reviewed his last visit from [facility] from 05/27/15 and it indicated he was to f/u in 4 weeks with Dr Lajoyce Cornersuda. Since his pain is so bad-I do want him to call and make that follow up -just so they know what is going on and see if there is anything else they recommend. Thanks

## 2015-12-05 DIAGNOSIS — M7542 Impingement syndrome of left shoulder: Secondary | ICD-10-CM | POA: Diagnosis not present

## 2015-12-05 DIAGNOSIS — M75112 Incomplete rotator cuff tear or rupture of left shoulder, not specified as traumatic: Secondary | ICD-10-CM | POA: Diagnosis not present

## 2015-12-05 DIAGNOSIS — M75122 Complete rotator cuff tear or rupture of left shoulder, not specified as traumatic: Secondary | ICD-10-CM | POA: Diagnosis not present

## 2016-01-02 ENCOUNTER — Ambulatory Visit (INDEPENDENT_AMBULATORY_CARE_PROVIDER_SITE_OTHER): Payer: Self-pay | Admitting: Orthopedic Surgery

## 2016-01-23 ENCOUNTER — Ambulatory Visit (INDEPENDENT_AMBULATORY_CARE_PROVIDER_SITE_OTHER): Payer: Self-pay | Admitting: Orthopedic Surgery

## 2016-01-30 ENCOUNTER — Ambulatory Visit (INDEPENDENT_AMBULATORY_CARE_PROVIDER_SITE_OTHER): Payer: BLUE CROSS/BLUE SHIELD | Admitting: Orthopedic Surgery

## 2016-01-30 ENCOUNTER — Encounter (INDEPENDENT_AMBULATORY_CARE_PROVIDER_SITE_OTHER): Payer: Self-pay | Admitting: Orthopedic Surgery

## 2016-01-30 VITALS — Ht 72.0 in | Wt 338.0 lb

## 2016-01-30 DIAGNOSIS — M7542 Impingement syndrome of left shoulder: Secondary | ICD-10-CM | POA: Diagnosis not present

## 2016-01-30 MED ORDER — OXYCODONE-ACETAMINOPHEN 5-325 MG PO TABS: 1.0000 | ORAL_TABLET | ORAL | 0 refills | Status: DC | PRN

## 2016-01-30 MED ORDER — METHYLPREDNISOLONE ACETATE 40 MG/ML IJ SUSP
40.0000 mg | INTRAMUSCULAR | Status: AC | PRN
  Administered 2016-01-30: 40 mg via INTRA_ARTICULAR

## 2016-01-30 MED ORDER — LIDOCAINE HCL 1 % IJ SOLN
5.0000 mL | INTRAMUSCULAR | Status: AC | PRN
  Administered 2016-01-30: 5 mL

## 2016-01-30 NOTE — Progress Notes (Signed)
Office Visit Note   Patient: Jorge CobbBrandon J Maniscalco           Date of Birth: 1985/10/30           MRN: [id number] Visit Date: 01/30/2016              Requested by: Judy PimpleMarne A Tower, MD [address] [place], [place] [id number] PCP: Roxy MannsMarne Tower, MD   Assessment & Plan: Visit Diagnoses:  1. Impingement syndrome of left shoulder     Plan: Injection left shoulder subacromial. Perception for Percocet for pain following office in 4 weeks.  Follow-Up Instructions: Return in about 4 weeks (around 02/27/2016).   Orders:  Orders Placed This Encounter  Procedures  . Large Joint Injection/Arthrocentesis   Meds ordered this encounter  Medications  . oxyCODONE-acetaminophen (PERCOCET/ROXICET) 5-325 MG tablet    Sig: Take 1 tablet by mouth every 4 (four) hours as needed for severe pain.    Dispense:  30 tablet    Refill:  0      Procedures: Large Joint Inj Date/Time: 01/30/2016 9:23 AM Performed by: Kailan Laws V Authorized by: Nadara MustardUDA, Jaemarie Hochberg V   Consent Given by:  Patient Site marked: the procedure site was marked   Timeout: prior to procedure the correct patient, procedure, and site was verified   Indications:  Pain and diagnostic evaluation Location:  Shoulder Site:  L subacromial bursa Prep: patient was prepped and draped in usual sterile fashion   Needle Size:  22 G Needle Length:  1.5 inches Ultrasound Guidance: No   Fluoroscopic Guidance: No   Arthrogram: No   Medications:  5 mL lidocaine 1 %; 40 mg methylPREDNISolone acetate 40 MG/ML Aspiration Attempted: No   Patient tolerance:  Patient tolerated the procedure well with no immediate complications     Clinical Data: No additional findings.   Subjective: Chief Complaint  Patient presents with  . Left Shoulder - Pain, Follow-up    Patient is following up left shoulder pain. He has history of left shoulder arthroscopic debridement of partial rotator cuff tear and of SLAP lesion with subacromial  decompression. He was last seen in office 12/05/15. He is having sharp pain radiating down his left arm. He feels with this colder weather his shoulder is doing worse. He is wanting to possibly get cortisone injection for left shoulder today and is also requesting rx refill on percocet.    Review of Systems   Objective: Vital Signs: Ht 6' (1.829 m)   Wt (!) 338 lb (153.3 kg)   BMI 45.84 kg/m   Physical Exam On examination patient is alert when no neuropathy well-dressed normal affect normal respiratory effort. He has abduction and flexion to 120 pain with Neer and Hudson impingement test pain with drop arm test pain to palpation over the biceps tendon. Ortho Exam  Specialty Comments:  No specialty comments available.  Imaging: No results found.   PMFS History: Patient Active Problem List   Diagnosis Date Noted  . Frozen shoulder 05/07/2015  . Screening for HIV (human immunodeficiency virus) 03/31/2015  . Hyperglycemia 03/23/2015  . Pruritus 11/15/2014  . Diabetes mellitus screening 06/21/2014  . Chronic bursitis of left shoulder 04/23/2014  . Frequent loose stools 09/26/2012  . Hyperlipidemia 09/26/2012  . Hypertension, essential 12/16/2010  . Routine general medical examination at a health care facility 12/07/2010  . Anal fissure 08/13/2010  . Constipation 08/13/2010  . Sleep apnea 12/01/2008  . WISDOM TEETH EXTRACTION, HX OF 11/26/2008  . Obesity 07/28/2006  Past Medical History:  Diagnosis Date  . Morbid obesity with BMI of 45.0-49.9, adult (HCC)   . Other alteration of consciousness   . Other specified personal history presenting hazards to health(V15.89)   . Unspecified sleep apnea     Family History  Problem Relation Age of Onset  . Hyperlipidemia Father   . Hyperlipidemia Mother   . Diabetes      grandparents  . Throat cancer      grandmother  . Lung cancer      grandfather    Past Surgical History:  Procedure Laterality Date  . SHOULDER  ARTHROSCOPY W/ SUBACROMIAL DECOMPRESSION AND DISTAL CLAVICLE EXCISION Left    Dr. Kristeen Missan Caffrey  . TONSILLECTOMY    . WISDOM TOOTH EXTRACTION     Social History   Occupational History  . [employer]    Social History Main Topics  . Smoking status: Former Smoker    Packs/day: 1.50    Years: 6.00    Types: Cigarettes    Quit date: 03/16/2007  . Smokeless tobacco: Never Used  . Alcohol use 0.0 oz/week     Comment: Occasional  . Drug use: No  . Sexual activity: Not on file

## 2016-02-11 ENCOUNTER — Other Ambulatory Visit: Payer: Self-pay | Admitting: Family Medicine

## 2016-02-11 NOTE — Telephone Encounter (Signed)
Please schedule late spring PE and refill until then  

## 2016-02-11 NOTE — Telephone Encounter (Signed)
No recent/future appts., please advise  

## 2016-02-12 NOTE — Telephone Encounter (Signed)
Sent pt a mychart message letting him know he needs to schedule his CPE before his next refill is due

## 2016-02-13 ENCOUNTER — Encounter: Payer: Self-pay | Admitting: Family Medicine

## 2016-02-13 ENCOUNTER — Encounter (INDEPENDENT_AMBULATORY_CARE_PROVIDER_SITE_OTHER): Payer: Self-pay | Admitting: Orthopedic Surgery

## 2016-02-27 ENCOUNTER — Ambulatory Visit (INDEPENDENT_AMBULATORY_CARE_PROVIDER_SITE_OTHER): Payer: BLUE CROSS/BLUE SHIELD | Admitting: Family

## 2016-02-27 ENCOUNTER — Ambulatory Visit (INDEPENDENT_AMBULATORY_CARE_PROVIDER_SITE_OTHER): Payer: BLUE CROSS/BLUE SHIELD | Admitting: Orthopedic Surgery

## 2016-03-12 ENCOUNTER — Encounter: Payer: Self-pay | Admitting: Family Medicine

## 2016-03-16 ENCOUNTER — Ambulatory Visit (INDEPENDENT_AMBULATORY_CARE_PROVIDER_SITE_OTHER): Payer: BLUE CROSS/BLUE SHIELD | Admitting: Family

## 2016-03-24 ENCOUNTER — Ambulatory Visit (INDEPENDENT_AMBULATORY_CARE_PROVIDER_SITE_OTHER): Payer: BLUE CROSS/BLUE SHIELD

## 2016-03-24 DIAGNOSIS — Z23 Encounter for immunization: Secondary | ICD-10-CM | POA: Diagnosis not present

## 2016-04-05 ENCOUNTER — Encounter: Payer: Self-pay | Admitting: Family Medicine

## 2016-04-05 DIAGNOSIS — R739 Hyperglycemia, unspecified: Secondary | ICD-10-CM

## 2016-04-05 DIAGNOSIS — Z0000 Encounter for general adult medical examination without abnormal findings: Secondary | ICD-10-CM

## 2016-04-06 ENCOUNTER — Other Ambulatory Visit (INDEPENDENT_AMBULATORY_CARE_PROVIDER_SITE_OTHER): Payer: BLUE CROSS/BLUE SHIELD

## 2016-04-06 DIAGNOSIS — R739 Hyperglycemia, unspecified: Secondary | ICD-10-CM | POA: Diagnosis not present

## 2016-04-06 DIAGNOSIS — Z0000 Encounter for general adult medical examination without abnormal findings: Secondary | ICD-10-CM

## 2016-04-06 LAB — COMPREHENSIVE METABOLIC PANEL
ALK PHOS: 52 U/L (ref 39–117)
ALT: 48 U/L (ref 0–53)
AST: 30 U/L (ref 0–37)
Albumin: 3.9 g/dL (ref 3.5–5.2)
BILIRUBIN TOTAL: 0.8 mg/dL (ref 0.2–1.2)
BUN: 5 mg/dL — AB (ref 6–23)
CO2: 31 meq/L (ref 19–32)
CREATININE: 0.97 mg/dL (ref 0.40–1.50)
Calcium: 9.3 mg/dL (ref 8.4–10.5)
Chloride: 103 mEq/L (ref 96–112)
GFR: 115.3 mL/min (ref >60.00)
GLUCOSE: 131 mg/dL — AB (ref 70–99)
Potassium: 3.2 mEq/L — ABNORMAL LOW (ref 3.5–5.1)
SODIUM: 139 meq/L (ref 135–145)
TOTAL PROTEIN: 7.4 g/dL (ref 6.0–8.3)

## 2016-04-06 LAB — CBC WITH DIFFERENTIAL/PLATELET
BASOS PCT: 2.1 % (ref 0.0–3.0)
Basophils Absolute: 0.1 10*3/uL (ref 0.0–0.1)
EOS PCT: 4 % (ref 0.0–5.0)
Eosinophils Absolute: 0.2 10*3/uL (ref 0.0–0.7)
HEMATOCRIT: 41.7 % (ref 39.0–52.0)
HEMOGLOBIN: 14 g/dL (ref 13.0–17.0)
LYMPHS PCT: 42.2 % (ref 12.0–46.0)
Lymphs Abs: 2.5 10*3/uL (ref 0.7–4.0)
MCHC: 33.5 g/dL (ref 30.0–36.0)
MCV: 83.1 fl (ref 78.0–100.0)
Monocytes Absolute: 0.3 10*3/uL (ref 0.1–1.0)
Monocytes Relative: 5.4 % (ref 3.0–12.0)
Neutro Abs: 2.7 10*3/uL (ref 1.4–7.7)
Neutrophils Relative %: 46.3 % (ref 43.0–77.0)
Platelets: 438 10*3/uL — ABNORMAL HIGH (ref 150.0–400.0)
RBC: 5.02 Mil/uL (ref 4.22–5.81)
RDW: 13.2 % (ref 11.5–15.5)
WBC: 5.9 10*3/uL (ref 4.0–10.5)

## 2016-04-06 LAB — HEMOGLOBIN A1C: Hgb A1c MFr Bld: 5.7 % (ref 4.6–6.5)

## 2016-04-06 LAB — LIPID PANEL
CHOLESTEROL: 166 mg/dL (ref 0–200)
HDL: 26.6 mg/dL — AB (ref >39.00)
LDL CALC: 109 mg/dL — AB (ref 0–99)
NONHDL: 139.26
Total CHOL/HDL Ratio: 6
Triglycerides: 152 mg/dL — ABNORMAL HIGH (ref 0.0–149.0)
VLDL: 30.4 mg/dL (ref 0.0–40.0)

## 2016-04-06 LAB — TSH: TSH: 0.45 u[IU]/mL (ref 0.35–4.50)

## 2016-04-06 NOTE — Addendum Note (Signed)
Addended by: Baldomero LamyHAVERS, NATASHA C on: 04/06/2016 08:24 AM   Modules accepted: Orders

## 2016-04-07 ENCOUNTER — Encounter: Payer: BLUE CROSS/BLUE SHIELD | Admitting: Family Medicine

## 2016-04-12 ENCOUNTER — Ambulatory Visit: Payer: BLUE CROSS/BLUE SHIELD | Admitting: Pulmonary Disease

## 2016-04-14 ENCOUNTER — Ambulatory Visit: Payer: BLUE CROSS/BLUE SHIELD | Admitting: Pulmonary Disease

## 2016-05-28 ENCOUNTER — Encounter: Payer: Self-pay | Admitting: Family Medicine

## 2016-06-01 ENCOUNTER — Encounter: Payer: BLUE CROSS/BLUE SHIELD | Admitting: Family Medicine

## 2016-06-02 NOTE — Telephone Encounter (Signed)
fmla paperwork in Dr Royden Purlower's IN BOX

## 2016-06-16 ENCOUNTER — Encounter: Payer: Self-pay | Admitting: Family Medicine

## 2016-06-20 ENCOUNTER — Encounter: Payer: Self-pay | Admitting: Family Medicine

## 2016-06-20 ENCOUNTER — Other Ambulatory Visit: Payer: Self-pay | Admitting: Family Medicine

## 2016-06-21 ENCOUNTER — Telehealth: Payer: Self-pay | Admitting: Pulmonary Disease

## 2016-06-21 ENCOUNTER — Encounter: Payer: Self-pay | Admitting: Pulmonary Disease

## 2016-06-21 MED ORDER — AMLODIPINE BESYLATE 10 MG PO TABS: 10.0000 mg | ORAL_TABLET | Freq: Every day | ORAL | 3 refills | Status: DC

## 2016-06-21 NOTE — Telephone Encounter (Signed)
lmtcb x1 for pt. 

## 2016-06-22 ENCOUNTER — Ambulatory Visit (INDEPENDENT_AMBULATORY_CARE_PROVIDER_SITE_OTHER): Payer: BLUE CROSS/BLUE SHIELD | Admitting: Pulmonary Disease

## 2016-06-22 ENCOUNTER — Encounter: Payer: Self-pay | Admitting: Pulmonary Disease

## 2016-06-22 ENCOUNTER — Telehealth: Payer: Self-pay | Admitting: Family Medicine

## 2016-06-22 DIAGNOSIS — Z9989 Dependence on other enabling machines and devices: Secondary | ICD-10-CM | POA: Diagnosis not present

## 2016-06-22 DIAGNOSIS — G4733 Obstructive sleep apnea (adult) (pediatric): Secondary | ICD-10-CM

## 2016-06-22 DIAGNOSIS — E6609 Other obesity due to excess calories: Secondary | ICD-10-CM

## 2016-06-22 NOTE — Telephone Encounter (Signed)
Pt is off today which is why he was asking for an appt today. Pt is unable to come in tomorrow. Pt states that he is going to try and order a mask offline and will call our office is he has any issues getting it. Pt states that he has an appt with RA in a few weeks. Nothing further needed.  **hold sport deleted**

## 2016-06-22 NOTE — Assessment & Plan Note (Signed)
CPAP supplies will be renewed for a year  CPAP is set at 15 cm  Weight loss encouraged, compliance with goal of at least 4-6 hrs every night is the expectation. Advised against medications with sedative side effects Cautioned against driving when sleepy - understanding that sleepiness will vary on a day to day basis

## 2016-06-22 NOTE — Assessment & Plan Note (Signed)
Weight loss encouraged 

## 2016-06-22 NOTE — Telephone Encounter (Signed)
Paperwork faxed °Copy for file  °Copy for scan °Copy for pt °

## 2016-06-22 NOTE — Addendum Note (Signed)
Addended by: Maurene Capes on: 06/22/2016 03:53 PM   Modules accepted: Orders

## 2016-06-22 NOTE — Telephone Encounter (Signed)
Can get him in for 3 pm

## 2016-06-22 NOTE — Telephone Encounter (Signed)
Done and gave back to [facility]

## 2016-06-22 NOTE — Telephone Encounter (Signed)
Called and spoke with pt and he is aware of appt with RA today at 3.

## 2016-06-22 NOTE — Telephone Encounter (Signed)
Pt states that his CPAP mask is cracked and is needing new mask/headgear/straps.  Pt has tried gluing this and it has not fixed it. Pt states he cannot sleep without it at night.  Please advise Dr Vassie Loll if this patient can be worked in with you today. Thanks.

## 2016-06-22 NOTE — Telephone Encounter (Signed)
Patient is calling about his FMLA paperwork.  Patient needs Dr.Tower to fill out form.  Patient is faxing the form now and he needs the form back today. Yvetta Coder will put stars next to the updates to be initialed by Dr.Tower and put today's date and initial next to date on last page.  Form can be faxed to [employer] fax number [phone].

## 2016-06-22 NOTE — Telephone Encounter (Signed)
RA is booked for today. Has a held spot at 3pm to allow time for the double booked patients at 2pm (patients were double booked due to RA being scheduled to be working at the hospital instead of in clinic last week). RA has an opening tomorrow at 12. Will place a hold on the schedule in case pt wants to be seen then.   *Will send this to [place] since I do not have access to triage*

## 2016-06-22 NOTE — Progress Notes (Signed)
   Subjective:    Patient ID: Jorge Stevens, male    DOB: 1985-06-22, 31 y.o.   MRN: [id number]  HPI  30/M, morbidly obese with obstructive sleep apnea on CPAP 14cm  Snoring did not improve after tonsillectomy in 2008.   06/22/2016  Chief Complaint  Patient presents with  . Follow-up    for OSA. Pt uses [facility]     Last seen 12/2013 His CPAP was started at 10 cm, initially increased to 12 cm and then to 15 cm due to subjective snoring  He has been getting a supplies at bedtime from [facility]. He denies any problems with mask and pressure. He does have some dryness and wonders if he could trial a nasal mask. He is a mouth breather. He does not have nasal stuffiness, does use Flonase as needed for allergies. No coughing or shortness of breath. He does report good compliance with his machine. CPAP download was reviewed, shows excellent compliance more than 7 hours per night with no residual events and 15 cm and minimal leak  Significant tests/ events  PSG >> mod obstructive sleep apnea >> started CPAP 10 cm, medium quattro mask, goal 12 cm      Review of Systems neg for any significant sore throat, dysphagia, itching, sneezing, nasal congestion or excess/ purulent secretions, fever, chills, sweats, unintended wt loss, pleuritic or exertional cp, hempoptysis, orthopnea pnd or change in chronic leg swelling. Also denies presyncope, palpitations, heartburn, abdominal pain, nausea, vomiting, diarrhea or change in bowel or urinary habits, dysuria,hematuria, rash, arthralgias, visual complaints, headache, numbness weakness or ataxia.     Objective:   Physical Exam  Gen. Pleasant, obese, in no distress ENT - no lesions, no post nasal drip Neck: No JVD, no thyromegaly, no carotid bruits Lungs: no use of accessory muscles, no dullness to percussion, decreased without rales or rhonchi  Cardiovascular: Rhythm regular, heart sounds  normal, no murmurs or gallops, no  peripheral edema Musculoskeletal: No deformities, no cyanosis or clubbing , no tremors       Assessment & Plan:

## 2016-06-22 NOTE — Patient Instructions (Addendum)
CPAP supplies will be renewed for a year  CPAP is set at 15 cm

## 2016-06-25 ENCOUNTER — Other Ambulatory Visit: Payer: Self-pay | Admitting: Primary Care

## 2016-06-25 ENCOUNTER — Encounter: Payer: Self-pay | Admitting: Family Medicine

## 2016-06-25 DIAGNOSIS — R0981 Nasal congestion: Secondary | ICD-10-CM

## 2016-06-26 MED ORDER — FLUTICASONE PROPIONATE 50 MCG/ACT NA SUSP: 2.0000 | Freq: Every day | NASAL | 1 refills | Status: DC

## 2016-07-13 ENCOUNTER — Ambulatory Visit: Payer: BLUE CROSS/BLUE SHIELD | Admitting: Pulmonary Disease

## 2016-12-31 ENCOUNTER — Ambulatory Visit: Payer: BLUE CROSS/BLUE SHIELD | Admitting: Physician Assistant

## 2016-12-31 ENCOUNTER — Encounter: Payer: Self-pay | Admitting: Physician Assistant

## 2016-12-31 ENCOUNTER — Ambulatory Visit (INDEPENDENT_AMBULATORY_CARE_PROVIDER_SITE_OTHER): Payer: BLUE CROSS/BLUE SHIELD | Admitting: Physician Assistant

## 2016-12-31 VITALS — BP 144/94 | HR 75 | Temp 98.4°F | Wt 307.5 lb

## 2016-12-31 DIAGNOSIS — J069 Acute upper respiratory infection, unspecified: Secondary | ICD-10-CM

## 2016-12-31 MED ORDER — AMOXICILLIN-POT CLAVULANATE 875-125 MG PO TABS: 1.0000 | ORAL_TABLET | Freq: Two times a day (BID) | ORAL | 0 refills | Status: DC

## 2016-12-31 NOTE — Patient Instructions (Signed)
It was great to meet you!  Push fluids. Start the oral antibiotics and take with food --> please follow-up if symptoms worsen or persist.  Also, please begin Mucinex.

## 2016-12-31 NOTE — Progress Notes (Signed)
Jorge Stevens is a 31 y.o. male here for a new problem.   History of Present Illness:   Chief Complaint  Patient presents with  . Cough  . CONGESTION IN CHEST    HPI   Patient presents with cough and congestion x 4-5 days. Son was sick with similar symptoms. Son was diagnosed with asthma issues and URI -- he was started on an oral steroid. Patient is taking Dayquil and Nyquil. He has sleep apnea and is compliant with mask. No fever. Is having chills and SOB. Has tried steam baths. Not drinking enough fluids.  No hx of asthma, PNA, bronchitis.   No recent travel. Denies recent abx use within 1 year.  Currently does not smoke.  Past Medical History:  Diagnosis Date  . Morbid obesity with BMI of 45.0-49.9, adult (HCC)   . Other alteration of consciousness   . Other specified personal history presenting hazards to health(V15.89)   . Unspecified sleep apnea      Social History   Social History  . Marital status: Married    Spouse name: N/A  . Number of children: 1  . Years of education: N/A   Occupational History  . [employer]    Social History Main Topics  . Smoking status: Former Smoker    Packs/day: 1.50    Years: 6.00    Types: Cigarettes    Quit date: 03/16/2007  . Smokeless tobacco: Never Used  . Alcohol use 0.0 oz/week     Comment: Occasional  . Drug use: No  . Sexual activity: Not on file   Other Topics Concern  . Not on file   Social History Narrative   Married      Works at [employer], likes his job      Occasional coffee      One son 2010    Past Surgical History:  Procedure Laterality Date  . SHOULDER ARTHROSCOPY W/ SUBACROMIAL DECOMPRESSION AND DISTAL CLAVICLE EXCISION Left    Dr. Kristeen Miss  . TONSILLECTOMY    . WISDOM TOOTH EXTRACTION      Family History  Problem Relation Age of Onset  . Hyperlipidemia Father   . Hyperlipidemia Mother   . Diabetes Unknown        grandparents  . Throat cancer Unknown        grandmother  .  Lung cancer Unknown        grandfather    No Known Allergies  Current Medications:   Current Outpatient Prescriptions:  .  acetaminophen (TYLENOL) 500 MG tablet, Take 1-2 tablets (500-1,000 mg total) by mouth every 8 (eight) hours as needed for pain., Disp: , Rfl:  .  ALPRAZolam (XANAX) 1 MG tablet, Take 1 mg by mouth 3 (three) times daily as needed for anxiety., Disp: , Rfl:  .  amLODipine (NORVASC) 10 MG tablet, Take 1 tablet (10 mg total) by mouth daily., Disp: 90 tablet, Rfl: 3 .  fluticasone (FLONASE) 50 MCG/ACT nasal spray, Place 2 sprays into both nostrils daily., Disp: 16 g, Rfl: 1 .  hydrochlorothiazide (HYDRODIURIL) 25 MG tablet, Take 1 tablet (25 mg total) by mouth daily., Disp: 90 tablet, Rfl: 1 .  NON FORMULARY, CPAP 12 cm, full face mask , Disp: , Rfl:  .  oxyCODONE-acetaminophen (PERCOCET/ROXICET) 5-325 MG tablet, Take 1 tablet by mouth every 4 (four) hours as needed for severe pain., Disp: 30 tablet, Rfl: 0 .  amoxicillin-clavulanate (AUGMENTIN) 875-125 MG tablet, Take 1 tablet by mouth 2 (  two) times daily., Disp: 20 tablet, Rfl: 0   Review of Systems:   ROS  Negative unless otherwise specified by HPI.  Vitals:   Vitals:   12/31/16 1333  BP: (!) 144/94  Pulse: 75  Temp: 98.4 F (36.9 C)  TempSrc: Oral  SpO2: 98%  Weight: (!) 307 lb 8 oz (139.5 kg)     Body mass index is 41.7 kg/m.  Physical Exam:   Physical Exam  Constitutional: He appears well-developed. He is cooperative.  Non-toxic appearance. He does not have a sickly appearance. He does not appear ill. No distress.  HENT:  Head: Normocephalic and atraumatic.  Right Ear: Tympanic membrane, external ear and ear canal normal. Tympanic membrane is not erythematous, not retracted and not bulging.  Left Ear: Tympanic membrane, external ear and ear canal normal. Tympanic membrane is not erythematous, not retracted and not bulging.  Nose: Mucosal edema present. Right sinus exhibits maxillary sinus  tenderness. Right sinus exhibits no frontal sinus tenderness. Left sinus exhibits maxillary sinus tenderness. Left sinus exhibits no frontal sinus tenderness.  Mouth/Throat: Uvula is midline and mucous membranes are normal. Posterior oropharyngeal erythema present. No posterior oropharyngeal edema. Tonsils are 2+ on the right. Tonsils are 2+ on the left. No tonsillar exudate.  Eyes: Conjunctivae and lids are normal.  Neck: Trachea normal.  Cardiovascular: Normal rate, regular rhythm, S1 normal, S2 normal and normal heart sounds.   Pulmonary/Chest: Effort normal and breath sounds normal. He has no decreased breath sounds. He has no wheezes. He has no rhonchi. He has no rales.  Lymphadenopathy:    He has no cervical adenopathy.  Neurological: He is alert.  Skin: Skin is warm, dry and intact.  Psychiatric: He has a normal mood and affect. His speech is normal and behavior is normal.  Nursing note and vitals reviewed.     Assessment and Plan:    Apolinar JunesBrandon was seen today for cough and congestion in chest.  Diagnoses and all orders for this visit:  Upper respiratory tract infection, unspecified type No red flags on exam; no need for imaging at this time. Discussed need to push fluids and rest. Start Mucinex and begin oral antibiotics. Follow-up if symptoms worsen or persist despite treatment.  Other orders -     amoxicillin-clavulanate (AUGMENTIN) 875-125 MG tablet; Take 1 tablet by mouth 2 (two) times daily.    . Reviewed expectations re: course of current medical issues. . Discussed self-management of symptoms. . Outlined signs and symptoms indicating need for more acute intervention. . Patient verbalized understanding and all questions were answered. . See orders for this visit as documented in the electronic medical record. . Patient received an After-Visit Summary.  CMA or LPN served as scribe during this visit. History, Physical, and Plan performed by medical provider.  Documentation and orders reviewed and attested to.  Jarold MottoSamantha Joab Carden, PA-C

## 2017-01-02 ENCOUNTER — Encounter: Payer: Self-pay | Admitting: Family Medicine

## 2017-01-03 ENCOUNTER — Ambulatory Visit: Payer: Self-pay | Admitting: *Deleted

## 2017-01-03 NOTE — Telephone Encounter (Signed)
3 weeks of leave makes no sense to me if he does not need it - I will evaluate him in the am and talk to him about it  Let's see how he is doing and make a plan

## 2017-01-03 NOTE — Telephone Encounter (Signed)
Seen   4  Days   And   Was  rx     Anti biotic    And       Sudafed        Reason for Disposition . [1] Taking antibiotic > 72 hours (3 days) AND [2] sinus pain not improved  Protocols used: SINUS INFECTION ON ANTIBIOTIC FOLLOW-UP CALL-A-AH

## 2017-01-03 NOTE — Telephone Encounter (Signed)
Called pt and advise of Dr. Royden Purlower's comments and pt said he talked to his manager and he will not need to be out on leave but he will keep his appt with Dr. Reece AgarG to eval for possible ear infection

## 2017-01-03 NOTE — Telephone Encounter (Signed)
Patient has had a chronic sinus infection that has now resulted in an ear infection. Patient has an appointment in am to be evaluated. Patient is concerned that with his job- if he missed more than 7 days- he is required to take short term disability for 3 weeks. Patient wants the provider to be aware of this because in order to print the paperwork - the case will be active and it will have to be filled out. The patient is having anxiety over this and would like the provider to let him know if he will be covered for the additional period of 2 weeks- this and next.  Please call him and let him know.

## 2017-01-04 ENCOUNTER — Encounter: Payer: Self-pay | Admitting: Family Medicine

## 2017-01-04 ENCOUNTER — Ambulatory Visit (INDEPENDENT_AMBULATORY_CARE_PROVIDER_SITE_OTHER): Payer: BLUE CROSS/BLUE SHIELD | Admitting: Family Medicine

## 2017-01-04 VITALS — BP 122/82 | HR 73 | Temp 97.9°F | Wt 310.0 lb

## 2017-01-04 DIAGNOSIS — R0981 Nasal congestion: Secondary | ICD-10-CM

## 2017-01-04 DIAGNOSIS — J019 Acute sinusitis, unspecified: Secondary | ICD-10-CM | POA: Insufficient documentation

## 2017-01-04 DIAGNOSIS — Z23 Encounter for immunization: Secondary | ICD-10-CM

## 2017-01-04 MED ORDER — FLUTICASONE PROPIONATE 50 MCG/ACT NA SUSP: 2.0000 | Freq: Every day | NASAL | 1 refills | Status: DC

## 2017-01-04 NOTE — Addendum Note (Signed)
Addended by: Nanci PinaGOINS, Draycen Leichter on: 01/04/2017 09:14 AM   Modules accepted: Orders

## 2017-01-04 NOTE — Assessment & Plan Note (Signed)
Apolinar JunesBrandon has acute sinusitis, viral vs bacterial. He is on appropriate therapy with augmentin course. Encouraged give abx more time. Reviewed further supportive care - NSAID, mucinex, add on flonase. Update if not improving with treatment as expected. Pt agrees with plan.

## 2017-01-04 NOTE — Progress Notes (Signed)
BP 122/82 (BP Location: Left Arm, Patient Position: Sitting, Cuff Size: Large)   Pulse 73   Temp 97.9 F (36.6 C) (Oral)   Wt (!) 310 lb (140.6 kg)   SpO2 98%   BMI 42.04 kg/m    CC: URI/ear congestion f/u Subjective:    Patient ID: Orpah CobbBrandon J Oxendine, male    DOB: 07-Mar-1986, 31 y.o.   MRN: [id number]  HPI: Orpah CobbBrandon J Carp is a [age] y.o. male presenting on 01/04/2017 for Follow-up   See chart for details.  Se[facility] by Rinal[facility] CloudSam Worley [facility] week with URI/sinusitis, treated with augmentin course, mucinex.   1 wk h/o congestion, some dyspnea, progressively worsening facial pain/pressure. Sunday morning woke up with R clogged ear - he tried pseudophed. This morning that is some better. Persistent pressure headache and facial pain. He continues taking ibuprofen. Initial sore throat now better, hoarse, PNDrainage. Ongoing productive cough. Head > chest congestion.   No documented fevers/chills, tooth pain.   Son sick recently.  Sleeps with CPAP machine. No h/o asthma.  Non smoker.   Relevant past medical, surgical, family and social history reviewed and updated as indicated. Interim medical history since our last visit reviewed. Allergies and medications reviewed and updated. Outpatient Medications Prior to Visit  Medication Sig Dispense Refill  . acetaminophen (TYLENOL) 500 MG tablet Take 1-2 tablets (500-1,000 mg total) by mouth every 8 (eight) hours as needed for pain.    Marland Kitchen. ALPRAZolam (XANAX) 1 MG tablet Take 1 mg by mouth 3 (three) times daily as needed for anxiety.    Marland Kitchen. amLODipine (NORVASC) 10 MG tablet Take 1 tablet (10 mg total) by mouth daily. 90 tablet 3  . amoxicillin-clavulanate (AUGMENTIN) 875-125 MG tablet Take 1 tablet by mouth 2 (two) times daily. 20 tablet 0  . hydrochlorothiazide (HYDRODIURIL) 25 MG tablet Take 1 tablet (25 mg total) by mouth daily. 90 tablet 1  . NON FORMULARY CPAP 12 cm, full face mask     . oxyCODONE-acetaminophen (PERCOCET/ROXICET) 5-325 MG  tablet Take 1 tablet by mouth every 4 (four) hours as needed for severe pain. 30 tablet 0  . fluticasone (FLONASE) 50 MCG/ACT nasal spray Place 2 sprays into both nostrils daily. 16 g 1   No facility-administered medications prior to visit.      Per HPI unless specifically indicated in ROS section below Review of Systems     Objective:    BP 122/82 (BP Location: Left Arm, Patient Position: Sitting, Cuff Size: Large)   Pulse 73   Temp 97.9 F (36.6 C) (Oral)   Wt (!) 310 lb (140.6 kg)   SpO2 98%   BMI 42.04 kg/m   Wt Readings from Last 3 Encounters:  01/04/17 (!) 310 lb (140.6 kg)  12/31/16 (!) 307 lb 8 oz (139.5 kg)  06/22/16 (!) 342 lb 12.8 oz (155.5 kg)    Physical Exam  Constitutional: He appears well-developed and well-nourished. No distress.  HENT:  Head: Normocephalic and atraumatic.  Right Ear: Hearing, external ear and ear canal normal.  Left Ear: Hearing, tympanic membrane, external ear and ear canal normal.  Nose: Mucosal edema present. No rhinorrhea. Right sinus exhibits maxillary sinus tenderness and frontal sinus tenderness. Left sinus exhibits maxillary sinus tenderness and frontal sinus tenderness.  Mouth/Throat: Uvula is midline, oropharynx is clear and moist and mucous membranes are normal. No oropharyngeal exudate, posterior oropharyngeal edema, posterior oropharyngeal erythema or tonsillar abscesses.  R TM erythematous but with good light reflex  Eyes: Pupils are  equal, round, and reactive to light. Conjunctivae and EOM are normal. No scleral icterus.  Neck: Normal range of motion. Neck supple.  Cardiovascular: Normal rate, regular rhythm, normal heart sounds and intact distal pulses.   No murmur heard. Pulmonary/Chest: Effort normal and breath sounds normal. No respiratory distress. He has no wheezes. He has no rales.  Lungs clear  Lymphadenopathy:    He has no cervical adenopathy.  Skin: Skin is warm and dry. No rash noted.  Nursing note and vitals  reviewed.     Assessment & Plan:   Problem List Items Addressed This Visit    Acute sinusitis - Primary    Kimsey has acute sinusitis, viral vs bacterial. He is on appropriate therapy with augmentin course. Encouraged give abx more time. Reviewed further supportive care - NSAID, mucinex, add on flonase. Update if not improving with treatment as expected. Pt agrees with plan.       Relevant Medications   fluticasone (FLONASE) 50 MCG/ACT nasal spray    Other Visit Diagnoses    Nasal congestion       Relevant Medications   fluticasone (FLONASE) 50 MCG/ACT nasal spray       Follow up plan: Return if symptoms worsen or fail to improve.  Eustaquio Boyden, MD

## 2017-01-04 NOTE — Patient Instructions (Addendum)
Flu shot today. You do have sinus infection. Continue augmentin as well as plain mucinex with plenty of water to mobilize mucous, continue ibuprofen for inflammation. Start flonase nasal steroid to keep sinuses open.  Take pseudophed for 1-2 more days then stop.  Let us know if fever >101, or ongoing symptoms despite antibiotic.

## 2017-01-11 ENCOUNTER — Encounter: Payer: Self-pay | Admitting: Family Medicine

## 2017-01-12 ENCOUNTER — Telehealth: Payer: Self-pay | Admitting: Family Medicine

## 2017-01-12 MED ORDER — BENZONATATE 200 MG PO CAPS: 200.0000 mg | ORAL_CAPSULE | Freq: Three times a day (TID) | ORAL | 1 refills | Status: DC | PRN

## 2017-01-12 NOTE — Telephone Encounter (Signed)
I [facility] to [facility]  

## 2017-01-12 NOTE — Telephone Encounter (Signed)
See 01/11/17 pt email.

## 2017-01-12 NOTE — Telephone Encounter (Signed)
I am running way behind and have to leave early today  Please call tessalon 200 mg 1 po tid prn cough (swallow whole, to not bite pill) to his [facility] pharmacy [phone] 1 ref

## 2017-01-12 NOTE — Telephone Encounter (Unsigned)
Copied from CRM [id number]. Topic: Appointment Scheduling - Scheduling Inquiry for Clinic >> Jan 12, 2017  9:56 AM Raquel SarnaHayes, Teresa G wrote: Pt is wanting to only see Dr. Milinda Antisower today for coughing and pain in chest.  He saw Dr. Sharen HonesGutierrez last week.  The sinus issue has cleared up, but coughing and chest pain is getting worse.  He left a MyChart message last night for Dr. Milinda Antisower and was wanting to see her.

## 2017-02-03 ENCOUNTER — Encounter: Payer: Self-pay | Admitting: Family Medicine

## 2017-03-21 ENCOUNTER — Encounter: Payer: Self-pay | Admitting: Family Medicine

## 2017-03-25 MED ORDER — IBUPROFEN 800 MG PO TABS
800.0000 mg | ORAL_TABLET | Freq: Three times a day (TID) | ORAL | 5 refills | Status: DC | PRN
Start: 2017-03-25 — End: 2017-06-16

## 2017-03-28 ENCOUNTER — Telehealth: Payer: Self-pay | Admitting: Family Medicine

## 2017-03-28 ENCOUNTER — Ambulatory Visit (INDEPENDENT_AMBULATORY_CARE_PROVIDER_SITE_OTHER): Payer: BLUE CROSS/BLUE SHIELD | Admitting: Orthopedic Surgery

## 2017-03-28 ENCOUNTER — Encounter: Payer: Self-pay | Admitting: *Deleted

## 2017-03-28 NOTE — Telephone Encounter (Signed)
Letter in IN box for pt to pick up

## 2017-03-28 NOTE — Telephone Encounter (Signed)
Sent pt mychart message letting him know letter ready, letter placed at the front desk for pick up

## 2017-03-29 ENCOUNTER — Telehealth: Payer: Self-pay | Admitting: Family Medicine

## 2017-03-29 NOTE — Telephone Encounter (Signed)
FYI  Per spouse this is a new accommodation for pt .  [employer] HR team maybe be sending forms for you to approve and sign

## 2017-04-04 NOTE — Telephone Encounter (Signed)
Given to Robin 

## 2017-04-04 NOTE — Telephone Encounter (Signed)
Done and in IN box 

## 2017-04-05 ENCOUNTER — Telehealth: Payer: Self-pay

## 2017-04-05 NOTE — Telephone Encounter (Signed)
Please follow up for an appointment so we can discuss it

## 2017-04-05 NOTE — Telephone Encounter (Signed)
I added due to moderate to severe chronic shoulder pain

## 2017-04-05 NOTE — Telephone Encounter (Signed)
Paperwork faxed °

## 2017-04-05 NOTE — Telephone Encounter (Signed)
Spoke with pt Jorge Stevens wanted to get clarification on question 5.  What medical reason for pt changing hours 8 to 5  To 8 to 4:30  Paperwork in dr tower's in box

## 2017-04-05 NOTE — Telephone Encounter (Signed)
Copied from CRM [phone]. Topic: Inquiry >> Apr 05, 2017  2:59 PM Terisa Starraylor, Brittany L wrote: Selena BattenKim from [employer] called and wants to know why the patient needs to leave work everyday at 4:30 pm when his sch is 8-5 & has 4 days off a month & 8 half days off a month and one additional daily 15 min break. They do no understand the medical need for this. Please advise  Fax number is [phone]. (kim) Call back is [phone]

## 2017-04-06 ENCOUNTER — Ambulatory Visit (INDEPENDENT_AMBULATORY_CARE_PROVIDER_SITE_OTHER): Payer: BLUE CROSS/BLUE SHIELD | Admitting: Family

## 2017-04-06 ENCOUNTER — Encounter (INDEPENDENT_AMBULATORY_CARE_PROVIDER_SITE_OTHER): Payer: Self-pay | Admitting: Family

## 2017-04-06 VITALS — Ht 72.0 in | Wt 310.0 lb

## 2017-04-06 DIAGNOSIS — M7542 Impingement syndrome of left shoulder: Secondary | ICD-10-CM | POA: Diagnosis not present

## 2017-04-06 MED ORDER — OXYCODONE-ACETAMINOPHEN 5-325 MG PO TABS: 1.0000 | ORAL_TABLET | Freq: Three times a day (TID) | ORAL | 0 refills | Status: DC | PRN

## 2017-04-06 MED ORDER — LIDOCAINE HCL 1 % IJ SOLN
5.0000 mL | INTRAMUSCULAR | Status: AC | PRN
  Administered 2017-04-06: 5 mL

## 2017-04-06 MED ORDER — METHYLPREDNISOLONE ACETATE 40 MG/ML IJ SUSP
40.0000 mg | INTRAMUSCULAR | Status: AC | PRN
  Administered 2017-04-06: 40 mg via INTRA_ARTICULAR

## 2017-04-06 NOTE — Telephone Encounter (Signed)
Copy for pt Copy for scan Copy for file

## 2017-04-06 NOTE — Progress Notes (Signed)
Office Visit Note   Patient: Jorge Stevens           Date of Birth: 1985/11/06           MRN: [id number] Visit Date: 04/06/2017              Requested by: Tower, Audrie Gallus, MD [address] [place], [place] [address] PCP: Judy Pimple, MD  Chief Complaint  Patient presents with  . Left Shoulder - Follow-up    S/p injection 01/29/17      HPI: The patient is a 32 year old gentleman seen today for evaluation of acute on chronic left shoulder pain. Has had issues with this shoulder for several years. Has been having worse pain recently. Is unable to reach above head to shave his head, behind back due to pain. Some pain with across body reaching. Global left shoulder pain. Worse anteriorly.  Has had arthroscopy to left shoulder x 2, once with Caffrey, then most recently with [facility] in 2017. States following surgery had reduced pain for about 6 months. Has had multiple DepoMedrol injections in left shoulder over the years. Requesting repeat injection today.  Requesting narcotic Rx today. States has been in pain management in past. Most recently with [facility]. States was released from their care. Was told to follow with PCP. PCP has recommended follow up with [place].   Requesting that we provide his "maintenance narcotics" from our office. Discussed that we cannot do this from here.   Assessment & Plan: Visit Diagnoses:  1. Impingement syndrome of left shoulder     Plan: DepoMedrol injection left shoulder today. One time Rx for pain. Patient understands is a one time Rx. To follow up in office as needed.   Follow-Up Instructions: No Follow-up on file.   Left Shoulder Exam   Tenderness  The patient is experiencing tenderness in the biceps tendon.  Range of Motion  Active abduction: 60  Passive abduction: normal  Forward flexion: 70   Tests  Impingement: positive Drop arm: negative  Other  Erythema: absent Sensation: normal Pulse: present       Patient is alert,  oriented, no adenopathy, well-dressed, normal affect, normal respiratory effort.   Imaging: No results found. No images are attached to the encounter.  Labs: Lab Results  Component Value Date   HGBA1C 5.7 04/06/2016   HGBA1C 5.7 03/27/2015   HGBA1C 5.9 06/21/2014    @LABSALLVALUES (HGBA1)@  Body mass index is 42.04 kg/m.  Orders:  No orders of the defined types were placed in this encounter.  Meds ordered this encounter  Medications  . oxyCODONE-acetaminophen (PERCOCET/ROXICET) 5-325 MG tablet    Sig: Take 1 tablet by mouth every 8 (eight) hours as needed for severe pain.    Dispense:  20 tablet    Refill:  0     Procedures: Large Joint Inj: L subacromial bursa on 04/06/2017 2:58 PM Indications: pain Details: 22 G 1.5 in needle Medications: 5 mL lidocaine 1 %; 40 mg methylPREDNISolone acetate 40 MG/ML Consent was given by the patient.      Clinical Data: No additional findings.  ROS:  All other systems negative, except as noted in the HPI. Review of Systems  Constitutional: Negative for chills and fever.  Musculoskeletal: Positive for arthralgias and myalgias. Negative for joint swelling.  Skin: Negative for color change.  Neurological: Negative for weakness and numbness.    Objective: Vital Signs: Ht 6' (1.829 m)   Wt (!) 310 lb (140.6  kg)   BMI 42.04 kg/m   Specialty Comments:  No specialty comments available.  PMFS History: Patient Active Problem List   Diagnosis Date Noted  . Acute sinusitis 01/04/2017  . Frozen shoulder 05/07/2015  . Screening for HIV (human immunodeficiency virus) 03/31/2015  . Hyperglycemia 03/23/2015  . Pruritus 11/15/2014  . Diabetes mellitus screening 06/21/2014  . Chronic bursitis of left shoulder 04/23/2014  . Frequent loose stools 09/26/2012  . Hyperlipidemia 09/26/2012  . Hypertension, essential 12/16/2010  . Routine general medical examination at a health care facility 12/07/2010  . Anal fissure 08/13/2010  .  Constipation 08/13/2010  . OSA on CPAP 12/01/2008  . WISDOM TEETH EXTRACTION, HX OF 11/26/2008  . Obesity 07/28/2006   Past Medical History:  Diagnosis Date  . Morbid obesity with BMI of 45.0-49.9, adult (HCC)   . Other alteration of consciousness   . Other specified personal history presenting hazards to health(V15.89)   . Unspecified sleep apnea     Family History  Problem Relation Age of Onset  . Hyperlipidemia Father   . Hyperlipidemia Mother   . Diabetes Unknown        grandparents  . Throat cancer Unknown        grandmother  . Lung cancer Unknown        grandfather    Past Surgical History:  Procedure Laterality Date  . SHOULDER ARTHROSCOPY W/ SUBACROMIAL DECOMPRESSION AND DISTAL CLAVICLE EXCISION Left    Dr. Kristeen Missan [facility]affrey  . TONSILLECTOMY    . WISDOM TOOTH EXTRACTION     Social History   Occupational History  . Occupation: [employer]  Tobacco Use  . Smoking status: Former Smoker    Packs/day: 1.50    Years: 6.00    Pack years: 9.00    Types: Cigarettes    Last attempt to quit: 03/16/2007    Years since quitting: 10.0  . Smokeless tobacco: Never Used  Substance and Sexual Activity  . Alcohol use: Yes    Alcohol/week: 0.0 oz    Comment: Occasional  . Drug use: No  . Sexual activity: Not on file

## 2017-04-06 NOTE — Telephone Encounter (Signed)
Spoke with wife and she said pt doesn't need an appt with Dr. Milinda Antisower she just told me to tell Selena BattenKim to forget the schedule change and that as long as he keeps his medical breaks and ergonomic equipment then he's fine. Called Kim and left VM letting her know Tanisha's comments regarding this issues  FYI to Dr. Milinda Antisower

## 2017-04-06 NOTE — Telephone Encounter (Signed)
Paperwork faxed 1/22

## 2017-04-07 ENCOUNTER — Ambulatory Visit (INDEPENDENT_AMBULATORY_CARE_PROVIDER_SITE_OTHER): Payer: BLUE CROSS/BLUE SHIELD | Admitting: Orthopedic Surgery

## 2017-04-11 ENCOUNTER — Ambulatory Visit: Payer: BLUE CROSS/BLUE SHIELD | Admitting: Family Medicine

## 2017-04-13 ENCOUNTER — Ambulatory Visit: Payer: BLUE CROSS/BLUE SHIELD | Admitting: Family Medicine

## 2017-05-16 ENCOUNTER — Ambulatory Visit (INDEPENDENT_AMBULATORY_CARE_PROVIDER_SITE_OTHER): Payer: BLUE CROSS/BLUE SHIELD | Admitting: Orthopedic Surgery

## 2017-05-16 ENCOUNTER — Encounter (INDEPENDENT_AMBULATORY_CARE_PROVIDER_SITE_OTHER): Payer: Self-pay | Admitting: Orthopedic Surgery

## 2017-05-16 VITALS — Ht 72.0 in | Wt 310.0 lb

## 2017-05-16 DIAGNOSIS — M7542 Impingement syndrome of left shoulder: Secondary | ICD-10-CM

## 2017-05-16 MED ORDER — OXYCODONE-ACETAMINOPHEN 5-325 MG PO TABS: 1.0000 | ORAL_TABLET | Freq: Three times a day (TID) | ORAL | 0 refills | Status: DC | PRN

## 2017-05-16 NOTE — Progress Notes (Signed)
Office Visit Note   Patient: Jorge Stevens           Date of Birth: Dec 17, 1985           MRN: [id number] Visit Date: 05/16/2017              Requested by: Tower, Audrie GallusMarne A, MD [address] [place], [place] [address] PCP: Judy Pimpleower, Marne A, MD  Chief Complaint  Patient presents with  . Left Shoulder - Pain    S/p injection 04/06/17.      HPI: Patient is a 32 year old gentleman who presents with persistent pain left shoulder.  Patient states he is burning pain feels like he is being stuck with a sharp burning rod into his shoulder.  He states the pain radiates from the shoulder down into his hand and fingers.  Denies any neck pain.  Patient states the Percocet has helped he states that the previous injection helped for about 2 weeks.  Patient states that anti-inflammatories have not helped at all.  Patient states he has difficulty typing for work due to his shoulder pain.  Patient is status post distal clavicle resection in 2007 and arthroscopic debridement in 2016.  Assessment & Plan: Visit Diagnoses:  1. Impingement syndrome of left shoulder     Plan: We will obtain an MRI scan of the left shoulder to further evaluate the rotator cuff pathology.  Follow-up after the MRI scan is obtained.  Follow-Up Instructions: No Follow-up on file.   Ortho Exam  Patient is alert, oriented, no adenopathy, well-dressed, normal affect, normal respiratory effort. Patient has a surgical scar from previous distal clavicle resection.  He is no tenderness to palpation over the distal clavicle.  He is tender to palpation over the biceps.  Neer and Hawkins impingement tests are painful patient will not actively lift his arm secondary to pain.  Patient has no dystrophic changes no skin color or temperature changes no hypersensitivity to light touch.  Imaging: No results found. No images are attached to the encounter.  Labs: Lab Results  Component Value Date   HGBA1C 5.7 04/06/2016   HGBA1C 5.7 03/27/2015   HGBA1C 5.9 06/21/2014    @LABSALLVALUES (HGBA1)@  Body mass index is 42.04 kg/m.  Orders:  Orders Placed This Encounter  Procedures  . MR Shoulder Left w/o contrast   Meds ordered this encounter  Medications  . oxyCODONE-acetaminophen (PERCOCET/ROXICET) 5-325 MG tablet    Sig: Take 1 tablet by mouth every 8 (eight) hours as needed for severe pain.    Dispense:  20 tablet    Refill:  0     Procedures: No procedures performed  Clinical Data: No additional findings.  ROS:  All other systems negative, except as noted in the HPI. Review of Systems  Objective: Vital Signs: Ht 6' (1.829 m)   Wt (!) 310 lb (140.6 kg)   BMI 42.04 kg/m   Specialty Comments:  No specialty comments available.  PMFS History: Patient Active Problem List   Diagnosis Date Noted  . Acute sinusitis 01/04/2017  . Frozen shoulder 05/07/2015  . Screening for HIV (human immunodeficiency virus) 03/31/2015  . Hyperglycemia 03/23/2015  . Pruritus 11/15/2014  . Diabetes mellitus screening 06/21/2014  . Chronic bursitis of left shoulder 04/23/2014  . Frequent loose stools 09/26/2012  . Hyperlipidemia 09/26/2012  . Hypertension, essential 12/16/2010  . Routine general medical examination at a health care facility 12/07/2010  . Anal fissure 08/13/2010  . Constipation 08/13/2010  . OSA on CPAP  12/01/2008  . WISDOM TEETH EXTRACTION, HX OF 11/26/2008  . Obesity 07/28/2006   Past Medical History:  Diagnosis Date  . Morbid obesity with BMI of 45.0-49.9, adult (HCC)   . Other alteration of consciousness   . Other specified personal history presenting hazards to health(V15.89)   . Unspecified sleep apnea     Family History  Problem Relation Age of Onset  . Hyperlipidemia Father   . Hyperlipidemia Mother   . Diabetes Unknown        grandparents  . Throat cancer Unknown        grandmother  . Lung cancer Unknown        grandfather    Past Surgical History:  Procedure  Laterality Date  . SHOULDER ARTHROSCOPY W/ SUBACROMIAL DECOMPRESSION AND DISTAL CLAVICLE EXCISION Left    Dr. Kristeen Miss  . TONSILLECTOMY    . WISDOM TOOTH EXTRACTION     Social History   Occupational History  . Occupation: [employer]  Tobacco Use  . Smoking status: Former Smoker    Packs/day: 1.50    Years: 6.00    Pack years: 9.00    Types: Cigarettes    Last attempt to quit: 03/16/2007    Years since quitting: 10.1  . Smokeless tobacco: Never Used  Substance and Sexual Activity  . Alcohol use: Yes    Alcohol/week: 0.0 oz    Comment: Occasional  . Drug use: No  . Sexual activity: Not on file

## 2017-05-29 ENCOUNTER — Ambulatory Visit
Admission: RE | Admit: 2017-05-29 | Discharge: 2017-05-29 | Disposition: A | Discharge status: Home / Self Care | Payer: BLUE CROSS/BLUE SHIELD | Source: Ambulatory Visit | Attending: Orthopedic Surgery | Admitting: Orthopedic Surgery

## 2017-05-29 DIAGNOSIS — M25512 Pain in left shoulder: Secondary | ICD-10-CM | POA: Diagnosis not present

## 2017-05-29 DIAGNOSIS — M7542 Impingement syndrome of left shoulder: Secondary | ICD-10-CM

## 2017-05-31 ENCOUNTER — Ambulatory Visit (INDEPENDENT_AMBULATORY_CARE_PROVIDER_SITE_OTHER): Payer: BLUE CROSS/BLUE SHIELD | Admitting: Orthopedic Surgery

## 2017-05-31 ENCOUNTER — Encounter (INDEPENDENT_AMBULATORY_CARE_PROVIDER_SITE_OTHER): Payer: Self-pay | Admitting: Orthopedic Surgery

## 2017-05-31 VITALS — Ht 72.0 in | Wt 310.0 lb

## 2017-05-31 DIAGNOSIS — M7542 Impingement syndrome of left shoulder: Secondary | ICD-10-CM | POA: Diagnosis not present

## 2017-05-31 MED ORDER — HYDROCODONE-ACETAMINOPHEN 5-325 MG PO TABS: 1.0000 | ORAL_TABLET | Freq: Four times a day (QID) | ORAL | 0 refills | Status: DC | PRN

## 2017-05-31 NOTE — Progress Notes (Signed)
Office Visit Note   Patient: Jorge Stevens           Date of Birth: Mar 11, 1986           MRN: [id number] Visit Date: 05/31/2017              Requested by: Tower, Audrie GallusMarne A, MD [address] [place], [place] [address] PCP: Judy Pimpleower, Marne A, MD  Chief Complaint  Patient presents with  . Left Shoulder - Follow-up    MRI L Shoulder review      HPI: Patient is a 32 year old gentleman with recurrent impingement symptoms of his left shoulder.  He is several years out from a debridement of the shoulder he has had no relief with injections he has temporary relief with the Vicodin.  He is status post an MRI scan.  Assessment & Plan: Visit Diagnoses:  1. 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  Patient states he would like to proceed with surgery.  Discussed that with arthroscopic debridement there would be no guarantees of the amount of improvement he would have from surgical intervention for debridement.  Discussed the patient will have shoulder pain on and off chronically.  Patient states he understands and wished to proceed with surgery discussed minimizing use of narcotic medication prior to surgery.  Follow-Up Instructions: Return if symptoms worsen or fail to improve.   Ortho Exam  Patient is alert, oriented, no adenopathy, well-dressed, normal affect, normal respiratory effort. On examination patient will not actively lift abduct or flex his shoulder.  He states that he will not move.  Patient has tenderness to palpation over the shoulder the [facility] joint is nontender to palpation passively he has pain-free abduction and flexion to 90 degrees.  He has internal and external rotation of 45 degrees.  The biceps tendon is nontender to palpation.  Review of the MRI scan shows previous bony decompression surgery with no findings of impingement he has tendinopathy of the  supraspinatus with some mild bursal surface fraying.  The biceps tendon is intact there is some thickening of the capsule consistent with possible adhesive capsulitis patient does have mild subacromial bursitis.  Imaging: No results found. No images are attached to the encounter.  Labs: Lab Results  Component Value Date   HGBA1C 5.7 04/06/2016   HGBA1C 5.7 03/27/2015   HGBA1C 5.9 06/21/2014    @LABSALLVALUES (HGBA1)@  Body mass index is 42.04 kg/m.  Orders:  No orders of the defined types were placed in this encounter.  No orders of the defined types were placed in this encounter.    Procedures: No procedures performed  Clinical Data: No additional findings.  ROS:  All other systems negative, except as noted in the HPI. Review of Systems  Objective: Vital Signs: Ht 6' (1.829 m)   Wt (!) 310 lb (140.6 kg)   BMI 42.04 kg/m   Specialty Comments:  No specialty comments available.  PMFS History: Patient Active Problem List   Diagnosis Date Noted  . Acute sinusitis 01/04/2017  . Frozen shoulder 05/07/2015  . Screening for HIV (human immunodeficiency virus) 03/31/2015  . Hyperglycemia 03/23/2015  . Pruritus 11/15/2014  . Diabetes mellitus screening 06/21/2014  . Chronic bursitis of left shoulder 04/23/2014  . Frequent loose stools 09/26/2012  . Hyperlipidemia 09/26/2012  . Hypertension, essential 12/16/2010  . Routine general medical examination at a health care  facility 12/07/2010  . Anal fissure 08/13/2010  . Constipation 08/13/2010  . OSA on CPAP 12/01/2008  . WISDOM TEETH EXTRACTION, HX OF 11/26/2008  . Obesity 07/28/2006   Past Medical History:  Diagnosis Date  . Morbid obesity with BMI of 45.0-49.9, adult (HCC)   . Other alteration of consciousness   . Other specified personal history presenting hazards to health(V15.89)   . Unspecified sleep apnea     Family History  Problem Relation Age of Onset  . Hyperlipidemia Father   . Hyperlipidemia  Mother   . Diabetes Unknown        grandparents  . Throat cancer Unknown        grandmother  . Lung cancer Unknown        grandfather    Past Surgical History:  Procedure Laterality Date  . SHOULDER ARTHROSCOPY W/ SUBACROMIAL DECOMPRESSION AND DISTAL CLAVICLE EXCISION Left    Dr. Kristeen Miss  . TONSILLECTOMY    . WISDOM TOOTH EXTRACTION     Social History   Occupational History  . Occupation: [employer]  Tobacco Use  . Smoking status: Former Smoker    Packs/day: 1.50    Years: 6.00    Pack years: 9.00    Types: Cigarettes    Last attempt to quit: 03/16/2007    Years since quitting: 10.2  . Smokeless tobacco: Never Used  Substance and Sexual Activity  . Alcohol use: Yes    Alcohol/week: 0.0 oz    Comment: Occasional  . Drug use: No  . Sexual activity: Not on file

## 2017-06-16 ENCOUNTER — Ambulatory Visit (INDEPENDENT_AMBULATORY_CARE_PROVIDER_SITE_OTHER): Payer: BLUE CROSS/BLUE SHIELD | Admitting: Orthopedic Surgery

## 2017-06-16 ENCOUNTER — Encounter (INDEPENDENT_AMBULATORY_CARE_PROVIDER_SITE_OTHER): Payer: Self-pay | Admitting: Orthopedic Surgery

## 2017-06-16 VITALS — Ht 72.0 in | Wt 310.0 lb

## 2017-06-16 DIAGNOSIS — M7542 Impingement syndrome of left shoulder: Secondary | ICD-10-CM

## 2017-06-16 MED ORDER — IBUPROFEN 800 MG PO TABS: 800.0000 mg | ORAL_TABLET | Freq: Three times a day (TID) | ORAL | 5 refills | Status: DC | PRN

## 2017-06-16 MED ORDER — HYDROCODONE-ACETAMINOPHEN 5-325 MG PO TABS: 1.0000 | ORAL_TABLET | Freq: Four times a day (QID) | ORAL | 0 refills | Status: DC | PRN

## 2017-06-16 NOTE — Progress Notes (Signed)
Office Visit Note   Patient: Jorge Stevens           Date of Birth: July 12, 1985           MRN: [id number] Visit Date: 06/16/2017              Requested by: Tower, Audrie Gallus, MD [address] [place], [place] [address] PCP: Judy Pimple, MD  Chief Complaint  Patient presents with  . Left Shoulder - Follow-up, Pain      HPI: Patient presents in follow-up for impingement syndrome left shoulder.  Patient states he still cannot sleep secondary to pain he states the Vicodin has helped.  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  Patient states that he was unable to go to work this week.  Patient states he would like to have 9 weeks out after surgery to rehabilitate his shoulder he states he feels like he has gone back to work too soon previously.  Patient has states that he is getting an aide to help him at home with taking his medication and changing his dressing.    Assessment & Plan: Visit Diagnoses:  1. Impingement syndrome of left shoulder     Plan: Patient is given a note that he is out of work from April 1 until surgery and then a note that says he will be out of work 9 weeks after surgery with post surgery on 07/06/2017.  Patient is given a refill prescription for Vicodin and a prescription for ibuprofen.  Discussed that if he takes the Vicodin on a routine basis prior to surgery this will not help him after surgery.  Patient requests overnight observation to ensure he does not have pain when the block wears off.  Follow-Up Instructions: No follow-ups on file.   Ortho Exam  Patient is alert, oriented, no adenopathy, well-dressed, normal affect, normal respiratory effort. Examination patient will not actively move his left arm.  Patient states that he has extreme weakness with grip  strength and cannot hold anything with his left hand.  He has pain with passive range of motion of the shoulder he has pain with Neer and Hawkins impingement test.  Imaging: No results found. No images are attached to the encounter.  Labs: Lab Results  Component Value Date   HGBA1C 5.7 04/06/2016   HGBA1C 5.7 03/27/2015   HGBA1C 5.9 06/21/2014    @LABSALLVALUES (HGBA1)@  Body mass index is 42.04 kg/m.  Orders:  No orders of the defined types were placed in this encounter.  Meds ordered this encounter  Medications  . HYDROcodone-acetaminophen (NORCO/VICODIN) 5-325 MG tablet    Sig: Take 1 tablet by mouth every 6 (six) hours as needed for moderate pain.    Dispense:  28 tablet    Refill:  0  . ibuprofen (ADVIL,MOTRIN) 800 MG tablet    Sig: Take 1 tablet (800 mg total) by mouth every 8 (eight) hours as needed for moderate pain (with a meal).    Dispense:  90 tablet    Refill:  5     Procedures: No procedures performed  Clinical Data: No additional findings.  ROS:  All other systems negative, except as noted in the HPI. Review of Systems  Objective: Vital Signs: Ht 6' (1.829 m)   Wt (!) 310  lb (140.6 kg)   BMI 42.04 kg/m   Specialty Comments:  No specialty comments available.  PMFS History: Patient Active Problem List   Diagnosis Date Noted  . Acute sinusitis 01/04/2017  . Frozen shoulder 05/07/2015  . Screening for HIV (human immunodeficiency virus) 03/31/2015  . Hyperglycemia 03/23/2015  . Pruritus 11/15/2014  . Diabetes mellitus screening 06/21/2014  . Chronic bursitis of left shoulder 04/23/2014  . Frequent loose stools 09/26/2012  . Hyperlipidemia 09/26/2012  . Hypertension, essential 12/16/2010  . Routine general medical examination at a health care facility 12/07/2010  . Anal fissure 08/13/2010  . Constipation 08/13/2010  . OSA on CPAP 12/01/2008  . WISDOM TEETH EXTRACTION, HX OF 11/26/2008  . Obesity 07/28/2006   Past Medical History:    Diagnosis Date  . Morbid obesity with BMI of 45.0-49.9, adult (HCC)   . Other alteration of consciousness   . Other specified personal history presenting hazards to health(V15.89)   . Unspecified sleep apnea     Family History  Problem Relation Age of Onset  . Hyperlipidemia Father   . Hyperlipidemia Mother   . Diabetes Unknown        grandparents  . Throat cancer Unknown        grandmother  . Lung cancer Unknown        grandfather    Past Surgical History:  Procedure Laterality Date  . SHOULDER ARTHROSCOPY W/ SUBACROMIAL DECOMPRESSION AND DISTAL CLAVICLE EXCISION Left    Dr. Kristeen Missan Caffrey  . TONSILLECTOMY    . WISDOM TOOTH EXTRACTION     Social History   Occupational History  . Occupation: [employer]  Tobacco Use  . Smoking status: Former Smoker    Packs/day: 1.50    Years: 6.00    Pack years: 9.00    Types: Cigarettes    Last attempt to quit: 03/16/2007    Years since quitting: 10.2  . Smokeless tobacco: Never Used  Substance and Sexual Activity  . Alcohol use: Yes    Alcohol/week: 0.0 oz    Comment: Occasional  . Drug use: No  . Sexual activity: Not on file

## 2017-06-17 ENCOUNTER — Other Ambulatory Visit (INDEPENDENT_AMBULATORY_CARE_PROVIDER_SITE_OTHER): Payer: Self-pay | Admitting: Orthopedic Surgery

## 2017-06-17 DIAGNOSIS — M7542 Impingement syndrome of left shoulder: Secondary | ICD-10-CM

## 2017-06-20 ENCOUNTER — Ambulatory Visit (INDEPENDENT_AMBULATORY_CARE_PROVIDER_SITE_OTHER): Payer: BLUE CROSS/BLUE SHIELD | Admitting: Orthopedic Surgery

## 2017-06-23 ENCOUNTER — Encounter: Payer: Self-pay | Admitting: Adult Health

## 2017-06-23 ENCOUNTER — Ambulatory Visit (INDEPENDENT_AMBULATORY_CARE_PROVIDER_SITE_OTHER): Payer: BLUE CROSS/BLUE SHIELD | Admitting: Adult Health

## 2017-06-23 DIAGNOSIS — G4733 Obstructive sleep apnea (adult) (pediatric): Secondary | ICD-10-CM

## 2017-06-23 DIAGNOSIS — Z9989 Dependence on other enabling machines and devices: Secondary | ICD-10-CM | POA: Diagnosis not present

## 2017-06-23 DIAGNOSIS — E6609 Other obesity due to excess calories: Secondary | ICD-10-CM | POA: Diagnosis not present

## 2017-06-23 DIAGNOSIS — E66811 Obesity, class 1: Secondary | ICD-10-CM

## 2017-06-23 NOTE — Progress Notes (Signed)
@Patient  ID: Jorge Stevens, male    DOB: [birth year], 32 y.o.   MRN: [id number]  Chief Complaint  Patient presents with  . Follow-up    OSA     Referring provider: Tower, Audrie Gallus, MD  HPI: 32 year old male followed for obstructive sleep apnea  Significant tests/ events  PSG >>mod obstructive sleep apnea >>started CPAP 10 cm, medium quattro mask, goal 12 cm     06/23/2017 Follow up : OSA  Patient returns for a one year follow-up for sleep apnea.  Patient says he is doing well on CPAP at bedtime.  Feels that he benefits with decreased daytime sleepiness.  Patient's download shows good compliance with average usage at 5.5.  Patient is on CPAP 15/H2O.  AHI 2.3.  Minimum leaks.  Going for rotator cuff surgery soon, reminded to take CPAP for surgery . Will be staying overnight .   Allergies  Allergen Reactions  . Ambien [Zolpidem Tartrate] Itching    Immunization History  Administered Date(s) Administered  . Influenza Split 12/16/2010  . Influenza Whole 01/07/2009  . Influenza,inj,Quad PF,6+ Mos 11/27/2012, 03/31/2015, 03/24/2016, 01/04/2017  . Td 11/20/2008    Past Medical History:  Diagnosis Date  . Morbid obesity with BMI of 45.0-49.9, adult (HCC)   . Other alteration of consciousness   . Other specified personal history presenting hazards to health(V15.89)   . Unspecified sleep apnea     Tobacco History: Social History   Tobacco Use  Smoking Status Former Smoker  . Packs/day: 1.50  . Years: 6.00  . Pack years: 9.00  . Types: Cigarettes  . Last attempt to quit: 03/16/2007  . Years since quitting: 10.2  Smokeless Tobacco Never Used   Counseling given: Not Answered   Outpatient Encounter Medications as of 06/23/2017  Medication Sig  . acetaminophen (TYLENOL) 500 MG tablet Take 1-2 tablets (500-1,000 mg total) by mouth every 8 (eight) hours as needed for pain.  Marland Kitchen ALPRAZolam (XANAX) 1 MG tablet Take 1 mg by mouth 3 (three) times daily as needed for  anxiety.  . fluticasone (FLONASE) 50 MCG/ACT nasal spray Place 2 sprays into both nostrils daily.  . hydrochlorothiazide (HYDRODIURIL) 25 MG tablet Take 1 tablet (25 mg total) by mouth daily.  Marland Kitchen HYDROcodone-acetaminophen (NORCO/VICODIN) 5-325 MG tablet Take 1 tablet by mouth every 6 (six) hours as needed for moderate pain.  Marland Kitchen ibuprofen (ADVIL,MOTRIN) 800 MG tablet Take 1 tablet (800 mg total) by mouth every 8 (eight) hours as needed for moderate pain (with a meal).  . NON FORMULARY CPAP 12 cm, full face mask   . oxyCODONE-acetaminophen (PERCOCET/ROXICET) 5-325 MG tablet Take 1 tablet by mouth every 8 (eight) hours as needed for severe pain.  Marland Kitchen amLODipine (NORVASC) 10 MG tablet Take 1 tablet (10 mg total) by mouth daily. (Patient not taking: Reported on 06/23/2017)  . [DISCONTINUED] amoxicillin-clavulanate (AUGMENTIN) 875-125 MG tablet Take 1 tablet by mouth 2 (two) times daily. (Patient not taking: Reported on 06/23/2017)  . [DISCONTINUED] benzonatate (TESSALON) 200 MG capsule Take 1 capsule (200 mg total) by mouth 3 (three) times daily as needed. Swallow whole, do not bite pill (Patient not taking: Reported on 06/23/2017)  . [DISCONTINUED] oxyCODONE-acetaminophen (PERCOCET/ROXICET) 5-325 MG tablet Take 1 tablet by mouth every 8 (eight) hours as needed for severe pain.   No facility-administered encounter medications on file as of 06/23/2017.      Review of Systems  Constitutional:   No  weight loss, night sweats,  Fevers, chills, fatigue, or  lassitude.  HEENT:   No headaches,  Difficulty swallowing,  Tooth/dental problems, or  Sore throat,                No sneezing, itching, ear ache, nasal congestion, post nasal drip,   CV:  No chest pain,  Orthopnea, PND, swelling in lower extremities, anasarca, dizziness, palpitations, syncope.   GI  No heartburn, indigestion, abdominal pain, nausea, vomiting, diarrhea, change in bowel habits, loss of appetite, bloody stools.   Resp: No shortness of  breath with exertion or at rest.  No excess mucus, no productive cough,  No non-productive cough,  No coughing up of blood.  No change in color of mucus.  No wheezing.  No chest wall deformity  Skin: no rash or lesions.  GU: no dysuria, change in color of urine, no urgency or frequency.  No flank pain, no hematuria   MS:  No joint pain or swelling.  No decreased range of motion.  No back pain.    Physical Exam  BP 128/76 (BP Location: Left Wrist, Cuff Size: Large)   Pulse 78   Ht 6\' 1"  (1.854 m)   Wt (!) 310 lb 3.2 oz (140.7 kg)   SpO2 98%   BMI 40.93 kg/m   GEN: A/Ox3; pleasant , NAD, obese    HEENT:  [place]/AT,  EACs-clear, TMs-wnl, NOSE-clear, THROAT-clear, no lesions, no postnasal drip or exudate noted. Class 2-3 MP airway   NECK:  Supple w/ fair ROM; no JVD; normal carotid impulses w/o bruits; no thyromegaly or nodules palpated; no lymphadenopathy.    RESP  Clear  P & A; w/o, wheezes/ rales/ or rhonchi. no accessory muscle use, no dullness to percussion  CARD:  RRR, no m/r/g, no peripheral edema, pulses intact, no cyanosis or clubbing.  GI:   Soft & nt; nml bowel sounds; no organomegaly or masses detected.   Musco: Warm bil, no deformities or joint swelling noted.   Neuro: alert, no focal deficits noted.    Skin: Warm, no lesions or rashes    Lab Results:  CBC  BMET  BNP No results found for: BNP  ProBNP No results found for: PROBNP  Imaging:    Assessment & Plan:   OSA on CPAP Well controlled on CPAP   Plan  Patient Instructions  Continue on CPAP at bedtime Keep up the good work Work on healthy weight Do not drive sleeping Follow-up in 1 year with Dr. Vassie LollAlva or.  Parrett NP and As needed        Obesity Wt loss      Rubye Oaksammy Parrett, NP 06/23/2017

## 2017-06-23 NOTE — Assessment & Plan Note (Signed)
Well controlled on CPAP   Plan  Patient Instructions  Continue on CPAP at bedtime Keep up the good work Work on healthy weight Do not drive sleeping Follow-up in 1 year with Dr. Vassie LollAlva or.  Ronit Cranfield NP and As needed

## 2017-06-23 NOTE — Assessment & Plan Note (Signed)
Wt loss  

## 2017-06-23 NOTE — Patient Instructions (Signed)
Continue on CPAP at bedtime Keep up the good work Work on [employer] weight Do not drive sleeping Follow-up in 1 year with Dr. Vassie LollAlva or.  Luke Rigsbee NP and As needed

## 2017-06-24 NOTE — Addendum Note (Signed)
Addended by: Boone MasterJONES, JESSICA E on: 06/24/2017 10:49 AM   Modules accepted: Orders

## 2017-06-28 ENCOUNTER — Encounter (HOSPITAL_COMMUNITY): Payer: Self-pay

## 2017-06-28 ENCOUNTER — Telehealth: Payer: Self-pay | Admitting: Family Medicine

## 2017-06-28 MED ORDER — BUSPIRONE HCL 30 MG PO TABS: 15.0000 mg | ORAL_TABLET | Freq: Two times a day (BID) | ORAL | 1 refills | Status: DC

## 2017-06-28 NOTE — Telephone Encounter (Signed)
Is he anxious all day long or on and off or just at night?  How is appetite? How is sleep?   Would you please check with his pharmacy and ask if they have buspar currently and if so what strengths they have?   Thanks

## 2017-06-28 NOTE — Telephone Encounter (Signed)
Copied from CRM [id number]. Topic: Quick Communication - See Telephone Encounter >> Jun 28, 2017 11:54 AM Jorge Stevens, Jennifer S wrote: CRM for notification. See Telephone encounter for: 06/28/17. Patient is requesting something be called in for anxiety, is scheduled for surgery on 07/06/17 w/ Dr Lajoyce Cornersuda, left shoulder. Patient is very anxious and and requesting something called in[facility] [place]

## 2017-06-28 NOTE — Pre-Procedure Instructions (Signed)
Jorge Stevens  06/28/2017      [id number] [place], [place] - [address] [place] [address] Phone: [phone] Fax: [phone]  [id number] [place], [place] - [address] [facility] [address] [place] [address] Phone: [phone] Fax: [phone]    Your procedure is scheduled on 07/06/2017.  Report to [facility] Admitting at [phone] A.M.  Call this number if you have problems the morning of surgery:  [phone]   Remember:  Do not eat food or drink liquids after midnight.  Take these medicines the morning of surgery with A SIP OF WATER: Acetaminophen (Tylenol) - if needed Alprazolam (Xanax) - if needed Fluticasone (Flonase) - if needed Hydrocodone-acetaminophen (Norco/vicodin) - if needed Eye drops - if needed  7 days prior to surgery STOP taking any Aspirin (unless otherwise instructed by your surgeon), Aleve, Naproxen, Ibuprofen, Motrin, Advil, Goody's, BC's, all herbal medications, fish oil, and all vitamins     Do not wear jewelry.  Do not wear lotions, powders, or colognes, or deodorant.  Men may shave face and neck.  Do not bring valuables to the hospital.  [facility] is not responsible for any belongings or valuables.  Hearing aids, eyeglasses, contacts, dentures or bridgework may not be worn into surgery.  Leave your suitcase in the car.  After surgery it may be brought to your room.  For patients admitted to the hospital, discharge time will be determined by your treatment team.  Patients discharged the day of surgery will not be allowed to drive home.   Name and phone number of your driver:    Special instructions:   [place]- Preparing For Surgery  Before surgery, you can play an important role. Because skin is not sterile, your skin needs to be as free of germs as possible. You can reduce the number of germs on your skin by washing  with CHG (chlorahexidine gluconate) Soap before surgery.  CHG is an antiseptic cleaner which kills germs and bonds with the skin to continue killing germs even after washing.  Please do not use if you have an allergy to CHG or antibacterial soaps. If your skin becomes reddened/irritated stop using the CHG.  Do not shave (including legs and underarms) for at least 48 hours prior to first CHG shower. It is OK to shave your face.  Please follow these instructions carefully.   1. Shower the NIGHT BEFORE SURGERY and the MORNING OF SURGERY with CHG.   2. If you chose to wash your hair, wash your hair first as usual with your normal shampoo.  3. After you shampoo, rinse your hair and body thoroughly to remove the shampoo.  4. Use CHG as you would any other liquid soap. You can apply CHG directly to the skin and wash gently with a scrungie or a clean washcloth.   5. Apply the CHG Soap to your body ONLY FROM THE NECK DOWN.  Do not use on open wounds or open sores. Avoid contact with your eyes, ears, mouth and genitals (private parts). Wash Face and genitals (private parts)  with your normal soap.  6. Wash thoroughly, paying special attention to the area where your surgery will be performed.  7. Thoroughly rinse your body with warm water from the neck down.  8. DO NOT shower/wash with your normal soap after using and rinsing off the CHG Soap.  9. Pat yourself dry with  a CLEAN TOWEL.  10. Wear CLEAN PAJAMAS to bed the night before surgery, wear comfortable clothes the morning of surgery  11. Place CLEAN SHEETS on your bed the night of your first shower and DO NOT SLEEP WITH PETS.    Day of Surgery: Shower as stated above. Do not apply any deodorants/lotions. Please wear clean clothes to the hospital/surgery center.      Please read over the following fact sheets that you were given.

## 2017-06-28 NOTE — Telephone Encounter (Signed)
Patient advised.

## 2017-06-28 NOTE — Telephone Encounter (Signed)
Pt said he is anxious all day long. His mind is racing and he can't stop thinking about the surgery, and "worse case scenarios". He is very afraid of needles and the though of one going in his neck is worrying him, he will think about it and start feeling down and then shuts down and doesn't want to talk. Pt said he has no appetite at all but he has to eat small meals to take his pain meds since he can't take them on an empty stomach. His mind is still racing and he is anxious at night to the point he is only getting about 2 hrs of sleep then he is right back up  [facility] and [facility] strengths of Buspar now. 5,7,10, and 30 back in stock

## 2017-06-28 NOTE — Telephone Encounter (Signed)
I will send in buspar  Start with 15 mg bid (1/2 of a 30 mg pill)  Let me know how he is in 3-4 days  If any side effects or worse-stop it and let me know

## 2017-06-29 ENCOUNTER — Other Ambulatory Visit: Payer: Self-pay

## 2017-06-29 ENCOUNTER — Encounter (HOSPITAL_COMMUNITY): Payer: Self-pay

## 2017-06-29 ENCOUNTER — Telehealth (INDEPENDENT_AMBULATORY_CARE_PROVIDER_SITE_OTHER): Payer: Self-pay

## 2017-06-29 ENCOUNTER — Encounter (HOSPITAL_COMMUNITY)
Admission: RE | Admit: 2017-06-29 | Discharge: 2017-06-29 | Disposition: A | Discharge status: Home / Self Care | Payer: BLUE CROSS/BLUE SHIELD | Source: Ambulatory Visit | Attending: Orthopedic Surgery | Admitting: Orthopedic Surgery

## 2017-06-29 DIAGNOSIS — Z01812 Encounter for preprocedural laboratory examination: Secondary | ICD-10-CM | POA: Insufficient documentation

## 2017-06-29 HISTORY — DX: Impingement syndrome of left shoulder: M75.42

## 2017-06-29 HISTORY — DX: Anxiety disorder, unspecified: F41.9

## 2017-06-29 LAB — CBC
HCT: 46 % (ref 39.0–52.0)
HEMOGLOBIN: 15.3 g/dL (ref 13.0–17.0)
MCH: 28.8 pg (ref 26.0–34.0)
MCHC: 33.3 g/dL (ref 30.0–36.0)
MCV: 86.6 fL (ref 78.0–100.0)
Platelets: 401 10*3/uL — ABNORMAL HIGH (ref 150–400)
RBC: 5.31 MIL/uL (ref 4.22–5.81)
RDW: 12.5 % (ref 11.5–15.5)
WBC: 6.4 10*3/uL (ref 4.0–10.5)

## 2017-06-29 LAB — BASIC METABOLIC PANEL
ANION GAP: 8 (ref 5–15)
BUN: 9 mg/dL (ref 6–20)
CALCIUM: 9.4 mg/dL (ref 8.9–10.3)
CO2: 25 mmol/L (ref 22–32)
Chloride: 106 mmol/L (ref 101–111)
Creatinine, Ser: 1.03 mg/dL (ref 0.61–1.24)
GFR calc non Af Amer: >60 mL/min (ref >60)
GLUCOSE: 83 mg/dL (ref 65–99)
Potassium: 3.8 mmol/L (ref 3.5–5.1)
Sodium: 139 mmol/L (ref 135–145)

## 2017-06-29 NOTE — Telephone Encounter (Signed)
Pt's wife states that he is having a lot of anxiety over his upcoming surgery and questioned if he could have something for this. She states that they spoke with the PCP and she gave rx for Buspar and that she wants him to have something to work immediatly like xanax or valium. She requested a 7 day supply and wanted to know if you would write rx for this. cb # [phone] and [facility] in [place] [address]

## 2017-06-29 NOTE — Progress Notes (Signed)
PCP - Dr. Milinda Antisower- [place]  Pulm- Dr. Vassie LollAlva- [place]  Cardiologist - Denies  Chest x-ray - Denies  EKG - Denies  Stress Test - Denies  ECHO - Denies  Cardiac Cath - Denies  Sleep Study - Yes- Positive CPAP - Yes- Told to bring mask dos  LABS- 06/29/17: CBC, BMP  Pt sts his bp is elevated due to him being very nervous about his up coming surgery. He has been prescribed medicine to help with the anxiety. Will recheck prior to him leaving.  Anesthesia- No  Pt denies having chest pain, sob, or fever at this time. All instructions explained to the pt, with a verbal understanding of the material. Pt agrees to go over the instructions while at home for a better understanding. The opportunity to ask questions was provided.

## 2017-06-29 NOTE — Progress Notes (Addendum)
Jorge CobbBrandon J Stevens            06/28/2017                          [id number] [place] - [address] [place] [address] Phone: [phone] Fax: [phone]  [id number] [place] - [address] [facility] [address] [place] [address] Phone: [phone] Fax: [phone]              Your procedure is scheduled on Wed., 07/06/2017.            [facility] Admitting at [phone] A.M.            Call this number if you have problems the morning of surgery:            [phone]             Remember:            Do not eat food or drink liquids after midnight.            Take these medicines the morning of surgery with A SIP OF WATER:  BusPIRone (BUSPAR) Acetaminophen (Tylenol) - if needed Alprazolam (Xanax) - if needed Fluticasone (Flonase) - if needed Hydrocodone-acetaminophen (Norco/vicodin) - if needed Eye drops - if needed  7 days prior to surgery STOP taking any Aspirin (unless otherwise instructed by your surgeon), Aleve, Naproxen, Ibuprofen, Motrin, Advil, Goody's, BC's, all herbal medications, fish oil, and all vitamins             Do not wear jewelry.            Do not wear lotions, powders, or colognes, or deodorant.            Men may shave face.            Do not bring valuables to the hospital.            [facility] is not responsible for any belongings or valuables.  Hearing aids, eyeglasses, contacts, dentures or bridgework may not be worn into surgery.  Leave your suitcase in the car.  After surgery it may be brought to your room.  For patients admitted to the hospital, discharge time will be determined by your treatment team.  Patients discharged the day of surgery will not be allowed to drive home.   Special instructions:   [place]- Preparing For Surgery  Before surgery, you can play an important role. Because skin  is not sterile, your skin needs to be as free of germs as possible. You can reduce the number of germs on your skin by washing with CHG (chlorahexidine gluconate) Soap before surgery.  CHG is an antiseptic cleaner which kills germs and bonds with the skin to continue killing germs even after washing.  Please do not use if you have an allergy to CHG or antibacterial soaps. If your skin becomes reddened/irritated stop using the CHG.  Do not shave (including legs and underarms) for at least 48 hours prior to first CHG shower. It is OK to shave your face.  Please follow these instructions carefully.  1. Shower the NIGHT BEFORE SURGERY and the MORNING OF SURGERY with CHG.   2. If you chose to wash your hair, wash your hair first as usual with your normal shampoo.  3. After you shampoo, rinse your hair and body thoroughly to remove the shampoo.  4. Use CHG as you would any other liquid soap. You can apply CHG directly to the skin and wash gently with a scrungie or a clean washcloth.   5. Apply the CHG Soap to your body ONLY FROM THE NECK DOWN.  Do not use on open wounds or open sores. Avoid contact with your eyes, ears, mouth and genitals (private parts). Wash Face and genitals (private parts)  with your normal soap.  6. Wash thoroughly, paying special attention to the area where your surgery will be performed.  7. Thoroughly rinse your body with warm water from the neck down.  8. DO NOT shower/wash with your normal soap after using and rinsing off the CHG Soap.  9. Pat yourself dry with a CLEAN TOWEL.  10. Wear CLEAN PAJAMAS to bed the night before surgery, wear comfortable clothes the morning of surgery  11. Place CLEAN SHEETS on your bed the night of your first shower and DO NOT SLEEP WITH PETS.  Day of Surgery: Shower as stated above. Do not apply any  deodorants/lotions. Please wear clean clothes to the hospital/surgery center.    Please read over the following fact sheets that you were given.

## 2017-06-30 ENCOUNTER — Encounter: Payer: Self-pay | Admitting: Family Medicine

## 2017-06-30 ENCOUNTER — Other Ambulatory Visit (INDEPENDENT_AMBULATORY_CARE_PROVIDER_SITE_OTHER): Payer: Self-pay | Admitting: Orthopedic Surgery

## 2017-06-30 MED ORDER — ALPRAZOLAM 1 MG PO TABS: 1.0000 mg | ORAL_TABLET | Freq: Three times a day (TID) | ORAL | 0 refills | Status: DC | PRN

## 2017-06-30 NOTE — Telephone Encounter (Signed)
Called and sw pt to advise that rx has been faxed to the pharm. To call with any questions or concerns.

## 2017-06-30 NOTE — Telephone Encounter (Signed)
[facility] [place]

## 2017-06-30 NOTE — Telephone Encounter (Signed)
Patient called to check on status of rx.

## 2017-07-05 MED ORDER — DEXTROSE 5 % IV SOLN
3.0000 g | INTRAVENOUS | Status: AC
  Administered 2017-07-06: 3 g via INTRAVENOUS
  Filled 2017-07-05: qty 3

## 2017-07-06 ENCOUNTER — Ambulatory Visit (HOSPITAL_COMMUNITY): Payer: BLUE CROSS/BLUE SHIELD | Admitting: Anesthesiology

## 2017-07-06 ENCOUNTER — Other Ambulatory Visit: Payer: Self-pay

## 2017-07-06 ENCOUNTER — Encounter (HOSPITAL_COMMUNITY)
Admission: RE | Disposition: A | Discharge status: Home / Self Care | Payer: Self-pay | Source: Ambulatory Visit | Attending: Orthopedic Surgery

## 2017-07-06 ENCOUNTER — Ambulatory Visit (HOSPITAL_COMMUNITY)
Admission: RE | Admit: 2017-07-06 | Discharge: 2017-07-07 | Disposition: A | Discharge status: Home / Self Care | Payer: BLUE CROSS/BLUE SHIELD | Source: Ambulatory Visit | Attending: Orthopedic Surgery | Admitting: Orthopedic Surgery

## 2017-07-06 ENCOUNTER — Encounter (HOSPITAL_COMMUNITY): Payer: Self-pay | Admitting: General Practice

## 2017-07-06 DIAGNOSIS — I10 Essential (primary) hypertension: Secondary | ICD-10-CM | POA: Diagnosis not present

## 2017-07-06 DIAGNOSIS — Y929 Unspecified place or not applicable: Secondary | ICD-10-CM | POA: Insufficient documentation

## 2017-07-06 DIAGNOSIS — G4730 Sleep apnea, unspecified: Secondary | ICD-10-CM | POA: Insufficient documentation

## 2017-07-06 DIAGNOSIS — M75112 Incomplete rotator cuff tear or rupture of left shoulder, not specified as traumatic: Secondary | ICD-10-CM | POA: Insufficient documentation

## 2017-07-06 DIAGNOSIS — G8918 Other acute postprocedural pain: Secondary | ICD-10-CM | POA: Diagnosis not present

## 2017-07-06 DIAGNOSIS — E119 Type 2 diabetes mellitus without complications: Secondary | ICD-10-CM | POA: Diagnosis not present

## 2017-07-06 DIAGNOSIS — Z6841 Body Mass Index (BMI) 40.0 and over, adult: Secondary | ICD-10-CM | POA: Diagnosis not present

## 2017-07-06 DIAGNOSIS — M7542 Impingement syndrome of left shoulder: Secondary | ICD-10-CM | POA: Diagnosis not present

## 2017-07-06 DIAGNOSIS — M12812 Other specific arthropathies, not elsewhere classified, left shoulder: Secondary | ICD-10-CM | POA: Diagnosis present

## 2017-07-06 DIAGNOSIS — S43432A Superior glenoid labrum lesion of left shoulder, initial encounter: Secondary | ICD-10-CM | POA: Diagnosis not present

## 2017-07-06 DIAGNOSIS — Z87891 Personal history of nicotine dependence: Secondary | ICD-10-CM | POA: Insufficient documentation

## 2017-07-06 DIAGNOSIS — X58XXXA Exposure to other specified factors, initial encounter: Secondary | ICD-10-CM | POA: Diagnosis not present

## 2017-07-06 DIAGNOSIS — F419 Anxiety disorder, unspecified: Secondary | ICD-10-CM | POA: Diagnosis not present

## 2017-07-06 DIAGNOSIS — Z79899 Other long term (current) drug therapy: Secondary | ICD-10-CM | POA: Diagnosis not present

## 2017-07-06 DIAGNOSIS — M75102 Unspecified rotator cuff tear or rupture of left shoulder, not specified as traumatic: Secondary | ICD-10-CM

## 2017-07-06 HISTORY — PX: SHOULDER ARTHROSCOPY: SHX128

## 2017-07-06 SURGERY — ARTHROSCOPY, SHOULDER
Anesthesia: General | Site: Shoulder | Laterality: Left

## 2017-07-06 MED ORDER — FLUTICASONE PROPIONATE 50 MCG/ACT NA SUSP
2.0000 | Freq: Every day | NASAL | Status: DC | PRN
  Filled 2017-07-06: qty 16

## 2017-07-06 MED ORDER — CHLORHEXIDINE GLUCONATE 4 % EX LIQD: 60.0000 mL | Freq: Once | CUTANEOUS | Status: DC

## 2017-07-06 MED ORDER — PROPOFOL 10 MG/ML IV BOLUS
INTRAVENOUS | Status: AC
  Filled 2017-07-06: qty 20

## 2017-07-06 MED ORDER — ONDANSETRON HCL 4 MG/2ML IJ SOLN
INTRAMUSCULAR | Status: DC | PRN
  Administered 2017-07-06: 4 mg via INTRAVENOUS

## 2017-07-06 MED ORDER — METHOCARBAMOL 1000 MG/10ML IJ SOLN
500.0000 mg | Freq: Four times a day (QID) | INTRAMUSCULAR | Status: DC | PRN
  Filled 2017-07-06: qty 5

## 2017-07-06 MED ORDER — LACTATED RINGERS IV SOLN
INTRAVENOUS | Status: DC
  Administered 2017-07-06: 11:00:00 via INTRAVENOUS

## 2017-07-06 MED ORDER — HYDROMORPHONE HCL 2 MG/ML IJ SOLN
0.5000 mg | INTRAMUSCULAR | Status: DC | PRN
  Administered 2017-07-07: 1 mg via INTRAVENOUS
  Filled 2017-07-06: qty 1

## 2017-07-06 MED ORDER — DEXAMETHASONE SODIUM PHOSPHATE 4 MG/ML IJ SOLN
INTRAMUSCULAR | Status: DC | PRN
  Administered 2017-07-06: 10 mg via INTRAVENOUS

## 2017-07-06 MED ORDER — LIDOCAINE HCL (CARDIAC) PF 100 MG/5ML IV SOSY
PREFILLED_SYRINGE | INTRAVENOUS | Status: DC | PRN
  Administered 2017-07-06: 100 mg via INTRAVENOUS

## 2017-07-06 MED ORDER — OXYCODONE HCL 5 MG PO TABS: 5.0000 mg | ORAL_TABLET | ORAL | Status: DC | PRN

## 2017-07-06 MED ORDER — ONDANSETRON HCL 4 MG/2ML IJ SOLN
4.0000 mg | Freq: Four times a day (QID) | INTRAMUSCULAR | Status: DC | PRN
  Administered 2017-07-07: 4 mg via INTRAVENOUS
  Filled 2017-07-06: qty 2

## 2017-07-06 MED ORDER — BISACODYL 10 MG RE SUPP: 10.0000 mg | Freq: Every day | RECTAL | Status: DC | PRN

## 2017-07-06 MED ORDER — OXYCODONE HCL 5 MG/5ML PO SOLN: 5.0000 mg | Freq: Once | ORAL | Status: DC | PRN

## 2017-07-06 MED ORDER — FENTANYL CITRATE (PF) 100 MCG/2ML IJ SOLN
INTRAMUSCULAR | Status: AC
  Administered 2017-07-06: 100 ug
  Filled 2017-07-06: qty 2

## 2017-07-06 MED ORDER — SUGAMMADEX SODIUM 200 MG/2ML IV SOLN
INTRAVENOUS | Status: DC | PRN
Start: 2017-07-06 — End: 2017-07-06
  Administered 2017-07-06: 500 mg via INTRAVENOUS

## 2017-07-06 MED ORDER — FENTANYL CITRATE (PF) 100 MCG/2ML IJ SOLN
INTRAMUSCULAR | Status: AC
  Filled 2017-07-06: qty 2

## 2017-07-06 MED ORDER — POLYETHYLENE GLYCOL 3350 17 G PO PACK: 17.0000 g | PACK | Freq: Every day | ORAL | Status: DC | PRN

## 2017-07-06 MED ORDER — PROPOFOL 10 MG/ML IV BOLUS
INTRAVENOUS | Status: DC | PRN
  Administered 2017-07-06: 120 mg via INTRAVENOUS
  Administered 2017-07-06: 200 mg via INTRAVENOUS

## 2017-07-06 MED ORDER — ALPRAZOLAM 0.5 MG PO TABS: 1.0000 mg | ORAL_TABLET | Freq: Three times a day (TID) | ORAL | Status: DC | PRN

## 2017-07-06 MED ORDER — METOCLOPRAMIDE HCL 5 MG/ML IJ SOLN: 5.0000 mg | Freq: Three times a day (TID) | INTRAMUSCULAR | Status: DC | PRN

## 2017-07-06 MED ORDER — PHENYLEPHRINE HCL 10 MG/ML IJ SOLN
INTRAMUSCULAR | Status: DC | PRN
  Administered 2017-07-06: 80 ug via INTRAVENOUS

## 2017-07-06 MED ORDER — ONDANSETRON HCL 4 MG/2ML IJ SOLN: 4.0000 mg | Freq: Once | INTRAMUSCULAR | Status: DC | PRN

## 2017-07-06 MED ORDER — OXYCODONE HCL 5 MG PO TABS
10.0000 mg | ORAL_TABLET | ORAL | Status: DC | PRN
  Administered 2017-07-06 – 2017-07-07 (×3): 15 mg via ORAL
  Filled 2017-07-06 (×3): qty 3

## 2017-07-06 MED ORDER — BUPIVACAINE-EPINEPHRINE (PF) 0.5% -1:200000 IJ SOLN
INTRAMUSCULAR | Status: DC | PRN
  Administered 2017-07-06: 20 mL via PERINEURAL

## 2017-07-06 MED ORDER — ACETAMINOPHEN 325 MG PO TABS
325.0000 mg | ORAL_TABLET | Freq: Four times a day (QID) | ORAL | Status: DC | PRN
  Administered 2017-07-06 – 2017-07-07 (×2): 650 mg via ORAL
  Filled 2017-07-06 (×2): qty 2

## 2017-07-06 MED ORDER — ONDANSETRON HCL 4 MG PO TABS: 4.0000 mg | ORAL_TABLET | Freq: Four times a day (QID) | ORAL | Status: DC | PRN

## 2017-07-06 MED ORDER — BUPIVACAINE LIPOSOME 1.3 % IJ SUSP
INTRAMUSCULAR | Status: DC | PRN
  Administered 2017-07-06: 10 mL

## 2017-07-06 MED ORDER — DOCUSATE SODIUM 100 MG PO CAPS
100.0000 mg | ORAL_CAPSULE | Freq: Two times a day (BID) | ORAL | Status: DC
  Administered 2017-07-06 – 2017-07-07 (×3): 100 mg via ORAL
  Filled 2017-07-06 (×3): qty 1

## 2017-07-06 MED ORDER — ACETAMINOPHEN 160 MG/5ML PO SOLN: 325.0000 mg | ORAL | Status: DC | PRN

## 2017-07-06 MED ORDER — METOCLOPRAMIDE HCL 5 MG PO TABS: 5.0000 mg | ORAL_TABLET | Freq: Three times a day (TID) | ORAL | Status: DC | PRN

## 2017-07-06 MED ORDER — OXYCODONE HCL 5 MG PO TABS: 5.0000 mg | ORAL_TABLET | Freq: Once | ORAL | Status: DC | PRN

## 2017-07-06 MED ORDER — ROCURONIUM BROMIDE 100 MG/10ML IV SOLN
INTRAVENOUS | Status: DC | PRN
  Administered 2017-07-06: 50 mg via INTRAVENOUS

## 2017-07-06 MED ORDER — MIDAZOLAM HCL 2 MG/2ML IJ SOLN
INTRAMUSCULAR | Status: AC
  Administered 2017-07-06: 2 mg
  Filled 2017-07-06: qty 2

## 2017-07-06 MED ORDER — ACETAMINOPHEN 325 MG PO TABS: 325.0000 mg | ORAL_TABLET | ORAL | Status: DC | PRN

## 2017-07-06 MED ORDER — SODIUM CHLORIDE 0.9 % IR SOLN
Status: DC | PRN
  Administered 2017-07-06 (×2): 3000 mL

## 2017-07-06 MED ORDER — BUSPIRONE HCL 5 MG PO TABS
15.0000 mg | ORAL_TABLET | Freq: Two times a day (BID) | ORAL | Status: DC
  Filled 2017-07-06 (×2): qty 1

## 2017-07-06 MED ORDER — CEFAZOLIN SODIUM-DEXTROSE 2-4 GM/100ML-% IV SOLN
2.0000 g | Freq: Four times a day (QID) | INTRAVENOUS | Status: AC
  Administered 2017-07-06 – 2017-07-07 (×3): 2 g via INTRAVENOUS
  Filled 2017-07-06 (×3): qty 100

## 2017-07-06 MED ORDER — FENTANYL CITRATE (PF) 100 MCG/2ML IJ SOLN
25.0000 ug | INTRAMUSCULAR | Status: DC | PRN
  Administered 2017-07-06: 50 ug via INTRAVENOUS

## 2017-07-06 MED ORDER — METHOCARBAMOL 500 MG PO TABS
500.0000 mg | ORAL_TABLET | Freq: Four times a day (QID) | ORAL | Status: DC | PRN
  Administered 2017-07-06 – 2017-07-07 (×2): 500 mg via ORAL
  Filled 2017-07-06 (×2): qty 1

## 2017-07-06 MED ORDER — MAGNESIUM CITRATE PO SOLN: 1.0000 | Freq: Once | ORAL | Status: DC | PRN

## 2017-07-06 MED ORDER — MEPERIDINE HCL 50 MG/ML IJ SOLN: 6.2500 mg | INTRAMUSCULAR | Status: DC | PRN

## 2017-07-06 MED ORDER — ASPIRIN 325 MG PO TABS
325.0000 mg | ORAL_TABLET | Freq: Every day | ORAL | Status: DC
  Administered 2017-07-06 – 2017-07-07 (×2): 325 mg via ORAL
  Filled 2017-07-06 (×2): qty 1

## 2017-07-06 MED ORDER — SODIUM CHLORIDE 0.9 % IV SOLN
INTRAVENOUS | Status: DC
Start: 2017-07-06 — End: 2017-07-07
  Administered 2017-07-06: 16:00:00 via INTRAVENOUS

## 2017-07-06 SURGICAL SUPPLY — 35 items
AID PSTN UNV HD RSTRNT DISP (MISCELLANEOUS) ×1
BLADE GREAT WHITE 4.2 (BLADE) ×1 IMPLANT
BLADE GREAT WHITE 4.2MM (BLADE)
BUR OVAL 6.0 (BURR) ×3 IMPLANT
CANNULA ACUFLEX KIT 5X76 (CANNULA) ×2 IMPLANT
CANNULA SHOULDER 7CM (CANNULA) ×3 IMPLANT
COVER SURGICAL LIGHT HANDLE (MISCELLANEOUS) ×2 IMPLANT
DRAPE STERI 35X30 U-POUCH (DRAPES) ×3 IMPLANT
DRAPE U-SHAPE 47X51 STRL (DRAPES) ×3 IMPLANT
DRSG EMULSION OIL 3X3 NADH (GAUZE/BANDAGES/DRESSINGS) ×3 IMPLANT
DRSG PAD ABDOMINAL 8X10 ST (GAUZE/BANDAGES/DRESSINGS) ×4 IMPLANT
DURAPREP 26ML APPLICATOR (WOUND CARE) ×3 IMPLANT
GAUZE SPONGE 4X4 12PLY STRL (GAUZE/BANDAGES/DRESSINGS) ×3 IMPLANT
GLOVE BIOGEL PI IND STRL 9 (GLOVE) ×1 IMPLANT
GLOVE BIOGEL PI INDICATOR 9 (GLOVE) ×2
GLOVE SURG ORTHO 9.0 STRL STRW (GLOVE) ×3 IMPLANT
GOWN STRL REUS W/ TWL XL LVL3 (GOWN DISPOSABLE) ×2 IMPLANT
GOWN STRL REUS W/TWL XL LVL3 (GOWN DISPOSABLE) ×6
KIT BASIN OR (CUSTOM PROCEDURE TRAY) ×3 IMPLANT
KIT TURNOVER KIT B (KITS) ×3 IMPLANT
MANIFOLD NEPTUNE II (INSTRUMENTS) ×3 IMPLANT
NDL SPNL 18GX3.5 QUINCKE PK (NEEDLE) ×1 IMPLANT
NEEDLE SPNL 18GX3.5 QUINCKE PK (NEEDLE) ×3 IMPLANT
NS IRRIG 1000ML POUR BTL (IV SOLUTION) ×1 IMPLANT
PACK SHOULDER (CUSTOM PROCEDURE TRAY) ×3 IMPLANT
PAD ARMBOARD 7.5X6 YLW CONV (MISCELLANEOUS) ×4 IMPLANT
RESTRAINT HEAD UNIVERSAL NS (MISCELLANEOUS) ×2 IMPLANT
SET ARTHROSCOPY TUBING (MISCELLANEOUS) ×3
SET ARTHROSCOPY TUBING LN (MISCELLANEOUS) ×1 IMPLANT
SLING ARM IMMOBILIZER XL (CAST SUPPLIES) ×1 IMPLANT
SUT ETHILON 2 0 FS 18 (SUTURE) ×3 IMPLANT
TAPE CLOTH SURG 4X10 WHT LF (GAUZE/BANDAGES/DRESSINGS) ×2 IMPLANT
TOWEL OR 17X24 6PK STRL BLUE (TOWEL DISPOSABLE) ×4 IMPLANT
WAND STAR VAC 90 (SURGICAL WAND) ×2 IMPLANT
WATER STERILE IRR 1000ML POUR (IV SOLUTION) ×3 IMPLANT

## 2017-07-06 NOTE — H&P (Signed)
Jorge Stevens is an 32 y.o. male.   Chief Complaint: Left shoulder pain inability to perform activities of daily living. HPI: Patient presents  for impingement syndrome left shoulder.  Patient states he still cannot sleep secondary to pain he states the Vicodin has helped.  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  Patient states that he was unable to go to work this week.  Patient states he would like to have 9 weeks out after surgery to rehabilitate his shoulder he states he feels like he has gone back to work too soon previously.  Patient has states that he is getting an aide to help him at home with taking his medication and changing his dressing.    Past Medical History:  Diagnosis Date  . Anxiety   . Morbid obesity with BMI of 45.0-49.9, adult (HCC)   . Other alteration of consciousness   . Other specified personal history presenting hazards to health(V15.89)   . Shoulder impingement syndrome, left   . Unspecified sleep apnea     Past Surgical History:  Procedure Laterality Date  . SHOULDER ARTHROSCOPY W/ SUBACROMIAL DECOMPRESSION AND DISTAL CLAVICLE EXCISION Left    Dr. Kristeen Missan Caffrey  . TONSILLECTOMY    . WISDOM TOOTH EXTRACTION      Family History  Problem Relation Age of Onset  . Hyperlipidemia Father   . Hyperlipidemia Mother   . Diabetes Unknown        grandparents  . Throat cancer Unknown        grandmother  . Lung cancer Unknown        grandfather   Social History:  reports that he quit smoking about 10 years ago. His smoking use included cigarettes. He has a 9.00 pack-year smoking history. He has never used smokeless tobacco. He reports that he drinks alcohol. He reports that he does not use drugs.  Allergies:  Allergies  Allergen Reactions  . Ambien [Zolpidem Tartrate]  Itching    No medications prior to admission.    No results found for this or any previous visit (from the past 48 hour(s)). No results found.  Review of Systems  All other systems reviewed and are negative.   There were no vitals taken for this visit. Physical Exam   Assessment/Plan 1. Impingement syndrome of left shoulder     Plan:  Plan for left shoulder arthroscopy debridement and decompression.  Risks and benefits were discussed including persistent pain.  Patient states he understands wishes to proceed at this time. Patient requests overnight observation to ensure he does not have pain when the block wears off.     Nadara MustardMarcus V Analea Muller, MD 07/06/2017, 6:43 AM

## 2017-07-06 NOTE — Progress Notes (Signed)
CPAP setup and patient doing well. Patient setup on CPAP of 15 cmH20 and no O2 bleeding in. Patient is on home settings and using home mask only. Patient will call if any further assistance needed.

## 2017-07-06 NOTE — Anesthesia Procedure Notes (Signed)
Anesthesia Regional Block: Interscalene brachial plexus block   Pre-Anesthetic Checklist: ,, timeout performed, Correct Patient, Correct Site, Correct Laterality, Correct Procedure, Correct Position, site marked, Risks and benefits discussed,  Surgical consent,  Pre-op evaluation,  At surgeon's request and post-op pain management  Laterality: Left  Prep: chloraprep       Needles:  Injection technique: Single-shot  Needle Type: Echogenic Stimulator Needle     Needle Length: 5cm  Needle Gauge: 22     Additional Needles:   Procedures:, nerve stimulator,,, ultrasound used (permanent image in chart),,,,   Nerve Stimulator or Paresthesia:  Response: hand, 0.45 mA,   Additional Responses:   Narrative:  Start time: 07/06/2017 12:22 PM End time: 07/06/2017 12:26 PM Injection made incrementally with aspirations every 5 mL.  Performed by: Personally  Anesthesiologist: Bethena Midgetddono, Ezio Wieck, MD  Additional Notes: Functioning IV was confirmed and monitors were applied.  A 50mm 22ga Arrow echogenic stimulator needle was used. Sterile prep and drape,hand hygiene and sterile gloves were used. Ultrasound guidance: relevant anatomy identified, needle position confirmed, local anesthetic spread visualized around nerve(s)., vascular puncture avoided.  Image printed for medical record. Negative aspiration and negative test dose prior to incremental administration of local anesthetic. The patient tolerated the procedure well.

## 2017-07-06 NOTE — Op Note (Signed)
07/06/2017  1:38 PM  PATIENT:  Jorge Stevens    PRE-OPERATIVE DIAGNOSIS:  Impingement Left Shoulder  POST-OPERATIVE DIAGNOSIS:  Same  PROCEDURE:  LEFT SHOULDER ARTHROSCOPY, DEBRIDEMENT, AND DECOMPRESSION  SURGEON:  Nadara MustardMarcus V Raven Harmes, MD  PHYSICIAN ASSISTANT:None ANESTHESIA:   General  PREOPERATIVE INDICATIONS:  Jorge Stevens is a  32 y.o. male with a diagnosis of Impingement Left Shoulder who failed conservative measures and elected for surgical management.    The risks benefits and alternatives were discussed with the patient preoperatively including but not limited to the risks of infection, bleeding, nerve injury, cardiopulmonary complications, the need for revision surgery, among others, and the patient was willing to proceed.  OPERATIVE IMPLANTS: None  @ENCIMAGES @  OPERATIVE FINDINGS: Partial thickness rotator cuff tear with SLAP lesion and adhesions to the subscapularis  OPERATIVE PROCEDURE: Patient was brought to the operating room after interscalene block he then underwent a general anesthetic.  After adequate levels of anesthesia were obtained patient was placed in the beachchair position of the left upper extremity was prepped using DuraPrep draped in the sterile field a timeout was called.  The scope was inserted from the posterior portal and anterior portal was established with outside in technique with an 18-gauge spinal.  Visualization showed a grade 1 SLAP lesion this was debrided with the vapor wand.  Patient had some partial thickness tearing at the insertion of the rotator cuff this was also debrided the biceps tendon was intact there were adhesions to the subscapularis and these were debrided.  The glenohumeral joint had good articular cartilage.  Instruments were removed the scope was then inserted from the subacromial from the posterior portal into the subacromial space and the new lateral portal was established.  Visualization showed significant amount of  inflammation in the subacromial bursa.  The vapor wand and shaver were used to debride the bursa.  There was partial thickness tearing of the rotator cuff this was debrided.  Patient had some bony spurs off the acromium and patient underwent subacromial decompression.  The vapor wand was then used for further hemostasis.  The inserts were removed the portals were closed using 3-0 nylon the sterile dressing was applied patient was placed in a sling extubated taken to PACU in stable condition.   DISCHARGE PLANNING:  Antibiotic duration: 24 hours postoperatively  Weightbearing: No restrictions  Pain medication: High-dose ordered  Dressing care/ Wound VAC: Keep dressing clean dry and intact  Ambulatory devices: Not applicable  Discharge to: Home in the morning  Follow-up: In the office 1 week post operative.

## 2017-07-06 NOTE — Transfer of Care (Signed)
Immediate Anesthesia Transfer of Care Note  Patient: Jorge CobbBrandon J Youngman  Procedure(s) Performed: LEFT SHOULDER ARTHROSCOPY, DEBRIDEMENT, AND DECOMPRESSION (Left Shoulder)  Patient Location: PACU  Anesthesia Type:GA combined with regional for post-op pain  Level of Consciousness: awake, oriented and drowsy  Airway & Oxygen Therapy: Patient Spontanous Breathing and Patient connected to nasal cannula oxygen  Post-op Assessment: Report given to RN and Post -op Vital signs reviewed and stable  Post vital signs: Reviewed and stable  Last Vitals:  Vitals Value Taken Time  BP 115/80 07/06/2017  2:05 PM  Temp    Pulse 90 07/06/2017  2:12 PM  Resp 20 07/06/2017  2:12 PM  SpO2 100 % 07/06/2017  2:12 PM  Vitals shown include unvalidated device data.  Last Pain:  Vitals:   07/06/17 1405  TempSrc:   PainSc: (P) Asleep         Complications: No apparent anesthesia complications

## 2017-07-06 NOTE — Anesthesia Preprocedure Evaluation (Signed)
Anesthesia Evaluation  Patient identified by MRN, date of birth, ID band Patient awake    Reviewed: Allergy & Precautions, H&P , NPO status , Patient's Chart, lab work & pertinent test results, reviewed documented beta blocker date and time   Airway Mallampati: II  TM Distance: >3 FB Neck ROM: full    Dental no notable dental hx.    Pulmonary neg pulmonary ROS, sleep apnea , former smoker,    Pulmonary exam normal breath sounds clear to auscultation       Cardiovascular Exercise Tolerance: Good hypertension, negative cardio ROS   Rhythm:regular Rate:Normal     Neuro/Psych negative neurological ROS  negative psych ROS   GI/Hepatic negative GI ROS, Neg liver ROS,   Endo/Other  negative endocrine ROS  Renal/GU negative Renal ROS  negative genitourinary   Musculoskeletal   Abdominal   Peds  Hematology negative hematology ROS (+)   Anesthesia Other Findings   Reproductive/Obstetrics negative OB ROS                             Anesthesia Physical Anesthesia Plan  ASA: III  Anesthesia Plan: General   Post-op Pain Management:  Regional for Post-op pain   Induction: Intravenous  PONV Risk Score and Plan: 2 and Ondansetron, Treatment may vary due to age or medical condition and Dexamethasone  Airway Management Planned: Oral ETT and LMA  Additional Equipment:   Intra-op Plan:   Post-operative Plan: Extubation in OR  Informed Consent: I have reviewed the patients History and Physical, chart, labs and discussed the procedure including the risks, benefits and alternatives for the proposed anesthesia with the patient or authorized representative who has indicated his/her understanding and acceptance.   Dental Advisory Given  Plan Discussed with: CRNA, Anesthesiologist and Surgeon  Anesthesia Plan Comments: (  )        Anesthesia Quick Evaluation

## 2017-07-06 NOTE — Anesthesia Procedure Notes (Signed)
Procedure Name: Intubation Date/Time: 07/06/2017 12:51 PM Performed by: Cordell Guercio T, CRNA Pre-anesthesia Checklist: Patient identified, Emergency Drugs available, Suction available and Patient being monitored Patient Re-evaluated:Patient Re-evaluated prior to induction Oxygen Delivery Method: Circle system utilized Preoxygenation: Pre-oxygenation with 100% oxygen Induction Type: IV induction Ventilation: Mask ventilation without difficulty Laryngoscope Size: Mac and 4 Grade View: Grade I Tube type: Oral Tube size: 7.5 mm Number of attempts: 1 Airway Equipment and Method: Patient positioned with wedge pillow and Stylet Placement Confirmation: ETT inserted through vocal cords under direct vision,  positive ETCO2 and breath sounds checked- equal and bilateral Secured at: 24 cm Tube secured with: Tape Dental Injury: Teeth and Oropharynx as per pre-operative assessment

## 2017-07-07 ENCOUNTER — Encounter (INDEPENDENT_AMBULATORY_CARE_PROVIDER_SITE_OTHER): Payer: Self-pay | Admitting: Orthopedic Surgery

## 2017-07-07 ENCOUNTER — Encounter (HOSPITAL_COMMUNITY): Payer: Self-pay | Admitting: Orthopedic Surgery

## 2017-07-07 DIAGNOSIS — M75112 Incomplete rotator cuff tear or rupture of left shoulder, not specified as traumatic: Secondary | ICD-10-CM | POA: Diagnosis not present

## 2017-07-07 DIAGNOSIS — Y929 Unspecified place or not applicable: Secondary | ICD-10-CM | POA: Diagnosis not present

## 2017-07-07 DIAGNOSIS — G4730 Sleep apnea, unspecified: Secondary | ICD-10-CM | POA: Diagnosis not present

## 2017-07-07 DIAGNOSIS — F419 Anxiety disorder, unspecified: Secondary | ICD-10-CM | POA: Diagnosis not present

## 2017-07-07 DIAGNOSIS — M7542 Impingement syndrome of left shoulder: Secondary | ICD-10-CM | POA: Diagnosis not present

## 2017-07-07 DIAGNOSIS — Z79899 Other long term (current) drug therapy: Secondary | ICD-10-CM | POA: Diagnosis not present

## 2017-07-07 DIAGNOSIS — Z6841 Body Mass Index (BMI) 40.0 and over, adult: Secondary | ICD-10-CM | POA: Diagnosis not present

## 2017-07-07 DIAGNOSIS — Z87891 Personal history of nicotine dependence: Secondary | ICD-10-CM | POA: Diagnosis not present

## 2017-07-07 DIAGNOSIS — S43432A Superior glenoid labrum lesion of left shoulder, initial encounter: Secondary | ICD-10-CM | POA: Diagnosis not present

## 2017-07-07 DIAGNOSIS — X58XXXA Exposure to other specified factors, initial encounter: Secondary | ICD-10-CM | POA: Diagnosis not present

## 2017-07-07 MED ORDER — OXYCODONE-ACETAMINOPHEN 10-325 MG PO TABS: 1.0000 | ORAL_TABLET | Freq: Four times a day (QID) | ORAL | 0 refills | Status: DC | PRN

## 2017-07-07 NOTE — Progress Notes (Signed)
Md told Nurse to help the patient ambulate that may help and to increase incentive spirometer

## 2017-07-07 NOTE — Progress Notes (Signed)
RN gave patient and wife discharge instructions they stated understanding pt not in any pain IV has been removed and they are awaiting the volunteer service

## 2017-07-07 NOTE — Progress Notes (Signed)
RN went in the patients room and the patient stated that he was hot but he was shivering, RN took patients vital signs but all were within limits (in flowsheets). RN called MD and left a message will continue to monitor.

## 2017-07-07 NOTE — Discharge Summary (Signed)
Discharge Diagnoses:  Active Problems:   Impingement syndrome of left shoulder   Rotator cuff tear arthropathy, left   Surgeries: Procedure(s): LEFT SHOULDER ARTHROSCOPY, DEBRIDEMENT, AND DECOMPRESSION on 07/06/2017    Consultants:   Discharged Condition: Improved  Hospital Course: Jorge Stevens is an 32 y.o. male who was admitted 07/06/2017 with a chief complaint of impingement syndrome left shoulder, with a final diagnosis of Impingement Left Shoulder.  Patient was brought to the operating room on 07/06/2017 and underwent Procedure(s): LEFT SHOULDER ARTHROSCOPY, DEBRIDEMENT, AND DECOMPRESSION.    Patient was given perioperative antibiotics:  Anti-infectives (From admission, onward)   Start     Dose/Rate Route Frequency Ordered Stop   07/06/17 1530  ceFAZolin (ANCEF) IVPB 2g/100 mL premix     2 g 200 mL/hr over 30 Minutes Intravenous Every 6 hours 07/06/17 1519 07/07/17 0450   07/06/17 0900  ceFAZolin (ANCEF) 3 g in dextrose 5 % 50 mL IVPB     3 g 130 mL/hr over 30 Minutes Intravenous To ShortStay Surgical 07/05/17 1023 07/06/17 1255    .  Patient was given sequential compression devices, early ambulation, and aspirin for DVT prophylaxis.  Recent vital signs:  Patient Vitals for the past 24 hrs:  BP Temp Temp src Pulse Resp SpO2 Height Weight  07/07/17 0555 (!) 144/86 98.1 F (36.7 C) Oral 89 18 - - -  07/07/17 0123 136/73 (!) 97.5 F (36.4 C) Oral 90 18 96 % - -  07/06/17 2303 - - - 97 18 96 % - -  07/06/17 2042 (!) 145/85 98.7 F (37.1 C) Oral 97 18 96 % - -  07/06/17 1525 135/81 98 F (36.7 C) Oral 86 18 97 % 6\' 1"  (1.854 m) (!) 318 lb 12.6 oz (144.6 kg)  07/06/17 1505 130/73 97.8 F (36.6 C) - 78 19 95 % - -  07/06/17 1500 - - - 73 16 96 % - -  07/06/17 1445 - - - 79 (!) 21 99 % - -  07/06/17 1435 124/64 - - 79 20 100 % - -  07/06/17 1430 - - - 74 16 99 % - -  07/06/17 1420 130/72 - - 88 (!) 23 97 % - -  07/06/17 1415 - - - 89 19 100 % - -  07/06/17 1405  115/80 97.6 F (36.4 C) - 89 (!) 23 100 % - -  07/06/17 1020 (!) 150/87 - - - - - - -  07/06/17 1018 - 98.2 F (36.8 C) Oral 85 20 99 % - (!) 308 lb (139.7 kg)  .  Recent laboratory studies: No results found.  Discharge Medications:   Allergies as of 07/07/2017      Reactions   Ambien [zolpidem Tartrate] Itching      Medication List    STOP taking these medications   HYDROcodone-acetaminophen 5-325 MG tablet Commonly known as:  NORCO/VICODIN   oxyCODONE-acetaminophen 5-325 MG tablet Commonly known as:  PERCOCET/ROXICET Replaced by:  oxyCODONE-acetaminophen 10-325 MG tablet     TAKE these medications   acetaminophen 500 MG tablet Commonly known as:  TYLENOL Take 1-2 tablets (500-1,000 mg total) by mouth every 8 (eight) hours as needed for pain.   ALPRAZolam 1 MG tablet Commonly known as:  XANAX Take 1 tablet (1 mg total) by mouth 3 (three) times daily as needed for anxiety.   busPIRone 30 MG tablet Commonly known as:  BUSPAR Take 0.5 tablets (15 mg total) by mouth 2 (two) times daily.  fluticasone 50 MCG/ACT nasal spray Commonly known as:  FLONASE Place 2 sprays into both nostrils daily. What changed:    when to take this  reasons to take this   ibuprofen 800 MG tablet Commonly known as:  ADVIL,MOTRIN Take 1 tablet (800 mg total) by mouth every 8 (eight) hours as needed for moderate pain (with a meal).   NON FORMULARY CPAP 12 cm, full face mask   oxyCODONE-acetaminophen 10-325 MG tablet Commonly known as:  PERCOCET Take 1 tablet by mouth every 6 (six) hours as needed for pain. Replaces:  oxyCODONE-acetaminophen 5-325 MG tablet   REWETTING DROPS Soln Apply 1 drop to eye daily as needed (for dry lenses/lubricant eyes.).       Diagnostic Studies: No results found.  Patient benefited maximally from their hospital stay and there were no complications.     Disposition: Discharge disposition: 01-Home or Self Care      Discharge Instructions     Call MD / Call 911   Complete by:  As directed    If you experience chest pain or shortness of breath, CALL 911 and be transported to the hospital emergency room.  If you develope a fever above 101 F, pus (white drainage) or increased drainage or redness at the wound, or calf pain, call your surgeon's office.   Constipation Prevention   Complete by:  As directed    Drink plenty of fluids.  Prune juice may be helpful.  You may use a stool softener, such as Colace (over the counter) 100 mg twice a day.  Use MiraLax (over the counter) for constipation as needed.   Diet - low sodium heart healthy   Complete by:  As directed    Increase activity slowly as tolerated   Complete by:  As directed      Follow-up Information    Nadara Mustard, MD In 1 week.   Specialty:  Orthopedic Surgery Contact information: [address] [place] [address] [phone]            Signed: Nadara Mustard 07/07/2017, 6:51 AM

## 2017-07-07 NOTE — Anesthesia Postprocedure Evaluation (Signed)
Anesthesia Post Note  Patient: Jorge Stevens  Procedure(s) Performed: LEFT SHOULDER ARTHROSCOPY, DEBRIDEMENT, AND DECOMPRESSION (Left Shoulder)     Patient location during evaluation: PACU Anesthesia Type: General Level of consciousness: awake and alert Pain management: pain level controlled Vital Signs Assessment: post-procedure vital signs reviewed and stable Respiratory status: spontaneous breathing, nonlabored ventilation, respiratory function stable and patient connected to nasal cannula oxygen Cardiovascular status: blood pressure returned to baseline and stable Postop Assessment: no apparent nausea or vomiting Anesthetic complications: no    Last Vitals:  Vitals:   07/07/17 0123 07/07/17 0555  BP: 136/73 (!) 144/86  Pulse: 90 89  Resp: 18 18  Temp: (!) 36.4 C 36.7 C  SpO2: 96%     Last Pain:  Vitals:   07/07/17 0620  TempSrc:   PainSc: 1                  Dallana Mavity

## 2017-07-11 ENCOUNTER — Telehealth (INDEPENDENT_AMBULATORY_CARE_PROVIDER_SITE_OTHER): Payer: Self-pay | Admitting: Orthopedic Surgery

## 2017-07-11 NOTE — Telephone Encounter (Signed)
Patient's wife called stating that they brought home bed bugs from the hospital and wanted to let Dr. Lajoyce Corners know that she was concerned about the bed bugs getting under his bandage, so she was going to change his bandage just to make sure.  CB#[phone].  Thank you.

## 2017-07-11 NOTE — Telephone Encounter (Signed)
Called as sw pt's wife advisef to remove the bandage was ok he has an appt tomorrow with Dr. Lajoyce Corners. Pt's wife talked about time out of work and possible forms needed advised we could talk about this tomorrow.

## 2017-07-12 ENCOUNTER — Encounter (INDEPENDENT_AMBULATORY_CARE_PROVIDER_SITE_OTHER): Payer: Self-pay | Admitting: Orthopedic Surgery

## 2017-07-12 ENCOUNTER — Ambulatory Visit (INDEPENDENT_AMBULATORY_CARE_PROVIDER_SITE_OTHER): Payer: BLUE CROSS/BLUE SHIELD | Admitting: Orthopedic Surgery

## 2017-07-12 VITALS — Ht 73.0 in | Wt 318.0 lb

## 2017-07-12 DIAGNOSIS — M7542 Impingement syndrome of left shoulder: Secondary | ICD-10-CM

## 2017-07-12 MED ORDER — OXYCODONE-ACETAMINOPHEN 10-325 MG PO TABS: 1.0000 | ORAL_TABLET | Freq: Four times a day (QID) | ORAL | 0 refills | Status: DC | PRN

## 2017-07-12 NOTE — Progress Notes (Signed)
Office Visit Note   Patient: Jorge Stevens           Date of Birth: [birth year]           MRN: [id number] Visit Date: 07/12/2017              Requested by: Tower, Audrie Gallus, MD [address] [place], [place] [address] PCP: Judy Pimple, MD  Chief Complaint  Patient presents with  . Left Shoulder - Routine Post Op    07/06/17 left shoulder scope deb, decomp      HPI: Patient presents 2 weeks status post left shoulder arthroscopy with debridement and decompression.  Patient had partial tearing of the rotator cuff he did have a spur on the acromion.  There is a grade 1 SLAP lesion the biceps tendon was intact there was good attachment of the rotator cuff along the humeral head.  Patient is holding his arm at his side.  Assessment & Plan: Visit Diagnoses:  1. Impingement syndrome of left shoulder     Plan: Patient states he wants to go back to people he used before for therapy he was given a prescription for internal and external rotation strengthening range of motion of the left shoulder with scapular stabilization.  Sutures harvested today recommended scar massage with lotion.  Follow-Up Instructions: Return in about 3 weeks (around 08/02/2017).   Ortho Exam  Patient is alert, oriented, no adenopathy, well-dressed, normal affect, normal respiratory effort. Examination patient has abduction and flexion to 70 degrees.  We will harvest the sutures today there is no signs of infection.  Patient was given a note to be out of work for 9 weeks from the time of surgery he is given a refill prescription for his Percocet.  Imaging: No results found. No images are attached to the encounter.  Labs: Lab Results  Component Value Date   HGBA1C 5.7 04/06/2016   HGBA1C 5.7 03/27/2015   HGBA1C 5.9 06/21/2014    (HGBA1:3,ALB:3,PREALB:3)@  Body mass index is 41.96 kg/m.  Orders:  No orders of the defined types were placed in this encounter.  Meds ordered this  encounter  Medications  . oxyCODONE-acetaminophen (PERCOCET) 10-325 MG tablet    Sig: Take 1 tablet by mouth every 6 (six) hours as needed for pain.    Dispense:  30 tablet    Refill:  0     Procedures: No procedures performed  Clinical Data: No additional findings.  ROS:  All other systems negative, except as noted in the HPI. Review of Systems  Objective: Vital Signs: Ht  (1.854 m)   Wt (!) 318 lb (144.2 kg)   BMI 41.96 kg/m   Specialty Comments:  No specialty comments available.  PMFS History: Patient Active Problem List   Diagnosis Date Noted  . Rotator cuff tear arthropathy, left 07/06/2017  . Impingement syndrome of left shoulder   . Acute sinusitis 01/04/2017  . Frozen shoulder 05/07/2015  . Screening for HIV (human immunodeficiency virus) 03/31/2015  . Hyperglycemia 03/23/2015  . Pruritus 11/15/2014  . Diabetes mellitus screening 06/21/2014  . Chronic bursitis of left shoulder 04/23/2014  . Frequent loose stools 09/26/2012  . Hyperlipidemia 09/26/2012  . Hypertension, essential 12/16/2010  . Routine general medical examination at a health care facility 12/07/2010  . Anal fissure 08/13/2010  . Constipation 08/13/2010  . OSA on CPAP 12/01/2008  . WISDOM TEETH EXTRACTION, HX OF 11/26/2008  . Obesity 07/28/2006   Past Medical History:  Diagnosis  Date  . Anxiety   . Morbid obesity with BMI of 45.0-49.9, adult (HCC)   . Other alteration of consciousness   . Other specified personal history presenting hazards to health(V15.89)   . Shoulder impingement syndrome, left   . Unspecified sleep apnea     Family History  Problem Relation Age of Onset  . Hyperlipidemia Father   . Hyperlipidemia Mother   . Diabetes Unknown        grandparents  . Throat cancer Unknown        grandmother  . Lung cancer Unknown        grandfather    Past Surgical History:  Procedure Laterality Date  . SHOULDER ARTHROSCOPY Left 07/06/2017   Procedure: LEFT SHOULDER  ARTHROSCOPY, DEBRIDEMENT, AND DECOMPRESSION;  Surgeon: Nadara Mustard, MD;  Location: [facility] OR;  Service: Orthopedics;  Laterality: Left;  . SHOULDER ARTHROSCOPY W/ SUBACROMIAL DECOMPRESSION AND DISTAL CLAVICLE EXCISION Left    Dr. Kristeen Miss  . TONSILLECTOMY    . WISDOM TOOTH EXTRACTION     Social History   Occupational History  . Occupation: [employer]  Tobacco Use  . Smoking status: Former Smoker    Packs/day: 1.50    Years: 6.00    Pack years: 9.00    Types: Cigarettes    Last attempt to quit: 03/16/2007    Years since quitting: 10.3  . Smokeless tobacco: Never Used  Substance and Sexual Activity  . Alcohol use: Yes    Alcohol/week: 0.0 oz    Comment: Occasional  . Drug use: No  . Sexual activity: Not on file

## 2017-07-17 ENCOUNTER — Encounter: Payer: Self-pay | Admitting: Family Medicine

## 2017-07-18 ENCOUNTER — Telehealth (INDEPENDENT_AMBULATORY_CARE_PROVIDER_SITE_OTHER): Payer: Self-pay | Admitting: Orthopedic Surgery

## 2017-07-18 MED ORDER — METHOCARBAMOL 500 MG PO TABS: 500.0000 mg | ORAL_TABLET | Freq: Three times a day (TID) | ORAL | 1 refills | Status: DC | PRN

## 2017-07-18 NOTE — Telephone Encounter (Signed)
I called and sw pt and advised per Dr. Lajoyce Corners that the heaviness is due to muscle weakness and that as he works on ROM and strengthening it will improve this feeling. He voiced understanding and will call with questions.

## 2017-07-18 NOTE — Telephone Encounter (Signed)
Robaxin

## 2017-07-18 NOTE — Telephone Encounter (Signed)
Patient had surgery 07/06/17 and shoulder is feeling heavy.Is this normal? Patient is in pain and doing exercising. Getting therapy set up and concerned about the feeling he is having.  [phone]  Please call patient to advise.

## 2017-07-18 NOTE — Telephone Encounter (Signed)
Pt called to f/u on mychart message. He said that he was given flexeril but it makes him sleepy all day and then it doesn't last as long as the robaxin does, pt is requesting Dr. Milinda Antis to prescribe him the Robaxin instead of the flexeril

## 2017-07-21 ENCOUNTER — Ambulatory Visit (INDEPENDENT_AMBULATORY_CARE_PROVIDER_SITE_OTHER): Payer: BLUE CROSS/BLUE SHIELD | Admitting: Orthopedic Surgery

## 2017-07-22 ENCOUNTER — Other Ambulatory Visit (INDEPENDENT_AMBULATORY_CARE_PROVIDER_SITE_OTHER): Payer: Self-pay | Admitting: Orthopedic Surgery

## 2017-07-22 MED ORDER — OXYCODONE-ACETAMINOPHEN 10-325 MG PO TABS
1.0000 | ORAL_TABLET | Freq: Three times a day (TID) | ORAL | 0 refills | Status: DC | PRN
Start: 2017-07-22 — End: 2017-09-08

## 2017-07-22 NOTE — Telephone Encounter (Signed)
Please advise 

## 2017-07-22 NOTE — Telephone Encounter (Signed)
rx written, last rx

## 2017-07-22 NOTE — Telephone Encounter (Signed)
I advised per Dr Lajoyce Corners. Patient verbalized understanding.

## 2017-08-02 ENCOUNTER — Telehealth (INDEPENDENT_AMBULATORY_CARE_PROVIDER_SITE_OTHER): Payer: Self-pay | Admitting: Orthopedic Surgery

## 2017-08-02 NOTE — Telephone Encounter (Signed)
Patient called stating that his STD needs the out of work note to state a specific return date.  He can either pick it up or you can fax it to them.  He did not give me the fax number.  CB#[phone]

## 2017-08-03 ENCOUNTER — Telehealth (INDEPENDENT_AMBULATORY_CARE_PROVIDER_SITE_OTHER): Payer: Self-pay

## 2017-08-03 DIAGNOSIS — M7542 Impingement syndrome of left shoulder: Secondary | ICD-10-CM | POA: Diagnosis not present

## 2017-08-03 NOTE — Telephone Encounter (Signed)
[place] with [facility] would like for MRI Report and Surgery Report to be faxed to [phone], attn: [place].  Cb# is [phone]. Thank You.

## 2017-08-03 NOTE — Telephone Encounter (Signed)
Both reports faxed as requested to brittany's attention

## 2017-08-04 ENCOUNTER — Ambulatory Visit (INDEPENDENT_AMBULATORY_CARE_PROVIDER_SITE_OTHER): Payer: BLUE CROSS/BLUE SHIELD | Admitting: Orthopedic Surgery

## 2017-08-04 ENCOUNTER — Other Ambulatory Visit (INDEPENDENT_AMBULATORY_CARE_PROVIDER_SITE_OTHER): Payer: Self-pay

## 2017-08-04 ENCOUNTER — Telehealth (INDEPENDENT_AMBULATORY_CARE_PROVIDER_SITE_OTHER): Payer: Self-pay | Admitting: Orthopedic Surgery

## 2017-08-04 NOTE — Telephone Encounter (Signed)
Patient called stating he is still experiencing a great deal of pain. Patient said he had (PT) yesterday and his shoulder is really hurting him. Patient said he also need an updated work note with a return to work date on it. The number to contact patient is [phone]

## 2017-08-04 NOTE — Telephone Encounter (Signed)
Called and sw pt to advise that he should have an anti inflammatory regimen and that the therapy will get easier as time goes on. He should continue with his exercise and has a follow up with Dr. Lajoyce Corners on 08/15/17. He asked for a return to work note for 10/13/17. The was written and pt will have Dr. Lajoyce Corners to include this in his dictated note from his appt on the 3rd.

## 2017-08-05 NOTE — Telephone Encounter (Signed)
This has been done.

## 2017-08-15 ENCOUNTER — Ambulatory Visit (INDEPENDENT_AMBULATORY_CARE_PROVIDER_SITE_OTHER): Payer: BLUE CROSS/BLUE SHIELD | Admitting: Orthopedic Surgery

## 2017-08-15 ENCOUNTER — Encounter (INDEPENDENT_AMBULATORY_CARE_PROVIDER_SITE_OTHER): Payer: Self-pay | Admitting: Orthopedic Surgery

## 2017-08-15 VITALS — Ht 73.0 in | Wt 318.0 lb

## 2017-08-15 DIAGNOSIS — M7542 Impingement syndrome of left shoulder: Secondary | ICD-10-CM

## 2017-08-15 NOTE — Progress Notes (Signed)
Office Visit Note   Patient: Jorge Stevens           Date of Birth: 1986-01-19           MRN: [id number] Visit Date: 08/15/2017              Requested by: Tower, Audrie Gallus, MD [address] [place], [place] [address] PCP: Judy Pimple, MD  Chief Complaint  Patient presents with  . Left Shoulder - Routine Post Op    42/24/19 left shoulder scope, debridement and decompression       HPI: Patient is a 32 year old gentleman who presents in follow-up status post left shoulder arthroscopy for debridement and decompression.  Patient states he still has some pain at night he still has decreased range of motion.  He states that he is doing biofeedback and 2 hours of physical therapy twice a week.  Patient states that therapy is recommending continuing therapy for at least 2 additional months to minimize risk of patient developing adhesive capsulitis and a frozen shoulder.  Assessment & Plan: Visit Diagnoses:  1. Impingement syndrome of left shoulder     Plan: Patient will need to continue physical therapy for 2 times a week for 2 months a prescription was provided he was given a note to be out of work for 2 months and evaluated follow-up for return to work.  Patient currently does not have sufficient function with his shoulder to return to work.  Follow-Up Instructions: Return in about 2 months (around 10/15/2017).   Ortho Exam  Patient is alert, oriented, no adenopathy, well-dressed, normal affect, normal respiratory effort. Examination patient has active abduction and flexion of 30 degrees of the left shoulder.  The portals are clean and dry there is no redness no cellulitis no drainage.  His left upper extremity is neurovascular intact.  Patient has pain with passive range of motion of the shoulder.  Imaging: No results found. No images are attached to the encounter.  Labs: Lab Results  Component Value Date   HGBA1C 5.7 04/06/2016   HGBA1C 5.7 03/27/2015   HGBA1C 5.9  06/21/2014     Lab Results  Component Value Date   ALBUMIN 3.9 04/06/2016   ALBUMIN 4.4 03/27/2015   ALBUMIN 4.3 11/15/2014    Body mass index is 41.96 kg/m.  Orders:  No orders of the defined types were placed in this encounter.  No orders of the defined types were placed in this encounter.    Procedures: No procedures performed  Clinical Data: No additional findings.  ROS:  All other systems negative, except as noted in the HPI. Review of Systems  Objective: Vital Signs: Ht 6\' 1"  (1.854 m)   Wt (!) 318 lb (144.2 kg)   BMI 41.96 kg/m   Specialty Comments:  No specialty comments available.  PMFS History: Patient Active Problem List   Diagnosis Date Noted  . Rotator cuff tear arthropathy, left 07/06/2017  . Impingement syndrome of left shoulder   . Acute sinusitis 01/04/2017  . Frozen shoulder 05/07/2015  . Screening for HIV (human immunodeficiency virus) 03/31/2015  . Hyperglycemia 03/23/2015  . Pruritus 11/15/2014  . Diabetes mellitus screening 06/21/2014  . Chronic bursitis of left shoulder 04/23/2014  . Frequent loose stools 09/26/2012  . Hyperlipidemia 09/26/2012  . Hypertension, essential 12/16/2010  . Routine general medical examination at a health care facility 12/07/2010  . Anal fissure 08/13/2010  . Constipation 08/13/2010  . OSA on CPAP 12/01/2008  . WISDOM  TEETH EXTRACTION, HX OF 11/26/2008  . Obesity 07/28/2006   Past Medical History:  Diagnosis Date  . Anxiety   . Morbid obesity with BMI of 45.0-49.9, adult (HCC)   . Other alteration of consciousness   . Other specified personal history presenting hazards to health(V15.89)   . Shoulder impingement syndrome, left   . Unspecified sleep apnea     Family History  Problem Relation Age of Onset  . Hyperlipidemia Father   . Hyperlipidemia Mother   . Diabetes Unknown        grandparents  . Throat cancer Unknown        grandmother  . Lung cancer Unknown        grandfather    Past  Surgical History:  Procedure Laterality Date  . SHOULDER ARTHROSCOPY Left 07/06/2017   Procedure: LEFT SHOULDER ARTHROSCOPY, DEBRIDEMENT, AND DECOMPRESSION;  Surgeon: Nadara Mustarduda, Marcus V, MD;  [facility];  Service: Orthopedics;  Laterality: Left;  . SHOULDER ARTHROSCOPY W/ SUBACROMIAL DECOMPRESSION AND DISTAL CLAVICLE EXCISION Left    Dr. Kristeen Missan Caffrey  . TONSILLECTOMY    . WISDOM TOOTH EXTRACTION     Social History   Occupational History  . Occupation: [employer]  Tobacco Use  . Smoking status: Former Smoker    Packs/day: 1.50    Years: 6.00    Pack years: 9.00    Types: Cigarettes    Last attempt to quit: 03/16/2007    Years since quitting: 10.4  . Smokeless tobacco: Never Used  Substance and Sexual Activity  . Alcohol use: Yes    Alcohol/week: 0.0 oz    Comment: Occasional  . Drug use: No  . Sexual activity: Not on file

## 2017-08-16 DIAGNOSIS — M7542 Impingement syndrome of left shoulder: Secondary | ICD-10-CM | POA: Diagnosis not present

## 2017-08-23 ENCOUNTER — Telehealth: Payer: Self-pay

## 2017-08-23 ENCOUNTER — Telehealth (INDEPENDENT_AMBULATORY_CARE_PROVIDER_SITE_OTHER): Payer: Self-pay | Admitting: Orthopedic Surgery

## 2017-08-23 MED ORDER — TRAMADOL HCL 50 MG PO TABS: 50.0000 mg | ORAL_TABLET | Freq: Two times a day (BID) | ORAL | 0 refills | Status: DC | PRN

## 2017-08-23 NOTE — Telephone Encounter (Signed)
Spoke to pt and advised per Dr Tower.  

## 2017-08-23 NOTE — Telephone Encounter (Signed)
Pt said had surgery on shoulder by Dr Luther Bradleyuba in April 2019; pt in rehab with PT now and thinks PT will continue thru Aug or Sept. Dr Luther Bradleyuba has advised pt to take Tylenol or Ibuprofen. Pt said neither med is helping pain. Pt has been alternating between heat and ice and using OTC patches for pain with no relief. Pt still having a lot of pain in shoulder and cannot raise hand above head. Pt requesting med not as strong as the Percocet but stronger than the Tylenol or Ibuprofen. Dr Luther Bradleyuba told pt he cannot give anything stronger than Tylenol or Ibuprofen for pain. Pt last seen acute visit 01/04/17 but last saw Dr Milinda Antisower 05/07/15. Pt will come in for appt if necessary to get pain med but does not want to schedule appt until note sent to Dr Milinda Antisower.[facility].Please advise. Pt request cb.

## 2017-08-23 NOTE — Telephone Encounter (Signed)
I called patient to get more info from his wifes email, patient said the only thing he was wanting to see about was a different kind of pain medication. He said Dr. Lajoyce Cornersuda was leery of prescribing percocet but if there is anything else he can take he feels like he needs it. He said the oil is not helping? Also patient states "physical therapy is killing his shoulder". Please advise patient # [phone]

## 2017-08-23 NOTE — Telephone Encounter (Signed)
Short term tramadol is the best I can do  If this turns into a long term pain management scenerio we will need to look at a pain clinic   Tramadol sent to pharmacy Only take when absolutely needed

## 2017-08-23 NOTE — Telephone Encounter (Signed)
Pt is stating that he is in pain and if he can not get refill on the percocet ( he was getting 10/325 after surgery) then he wants something else for pain. The pt did just receive an rx for tramadol today ( can view in chart)

## 2017-08-26 DIAGNOSIS — M7542 Impingement syndrome of left shoulder: Secondary | ICD-10-CM | POA: Diagnosis not present

## 2017-08-30 DIAGNOSIS — M7542 Impingement syndrome of left shoulder: Secondary | ICD-10-CM | POA: Diagnosis not present

## 2017-09-01 DIAGNOSIS — M7542 Impingement syndrome of left shoulder: Secondary | ICD-10-CM | POA: Diagnosis not present

## 2017-09-05 ENCOUNTER — Telehealth (INDEPENDENT_AMBULATORY_CARE_PROVIDER_SITE_OTHER): Payer: Self-pay

## 2017-09-05 DIAGNOSIS — M7542 Impingement syndrome of left shoulder: Secondary | ICD-10-CM | POA: Diagnosis not present

## 2017-09-05 NOTE — Telephone Encounter (Signed)
Called and states that the pt is not making as much progress as she would have thought he would make at this stage following his shoulder surgery. She was calling to see if there was any pain control that could be offered to the pt. She stated that he is only taking Ibuprofen 1600mg  a day and this is not helping. She states that the lack of progress is due to the pt being so guarded when working on ROM. Looking at his meds the pt did receive an rx of tramadol #30 on 08/23/17. Advised that Dr. Lajoyce Cornersuda is out of the office until Thursday and will hold message to follow up with him.

## 2017-09-06 DIAGNOSIS — M7542 Impingement syndrome of left shoulder: Secondary | ICD-10-CM | POA: Diagnosis not present

## 2017-09-08 ENCOUNTER — Other Ambulatory Visit (INDEPENDENT_AMBULATORY_CARE_PROVIDER_SITE_OTHER): Payer: Self-pay

## 2017-09-08 MED ORDER — OXYCODONE-ACETAMINOPHEN 10-325 MG PO TABS: 1.0000 | ORAL_TABLET | Freq: Three times a day (TID) | ORAL | 0 refills | Status: DC | PRN

## 2017-09-08 NOTE — Telephone Encounter (Signed)
I called pt to advise that per Dr. Lajoyce Cornersuda he could write a 1 time rx for Oxycodone and this is to be used sparingly. If he has continued pain that we would need to see him sooner in office for evaluation. rx at the front desk for pick up.

## 2017-09-12 DIAGNOSIS — M7542 Impingement syndrome of left shoulder: Secondary | ICD-10-CM | POA: Diagnosis not present

## 2017-09-30 DIAGNOSIS — M7542 Impingement syndrome of left shoulder: Secondary | ICD-10-CM | POA: Diagnosis not present

## 2017-10-04 ENCOUNTER — Telehealth (INDEPENDENT_AMBULATORY_CARE_PROVIDER_SITE_OTHER): Payer: Self-pay | Admitting: Orthopedic Surgery

## 2017-10-04 NOTE — Telephone Encounter (Signed)
Patient is wondering if he can get his leave extended from work, he said he'd like to be written out a little longer so he can finish up with physical therapy. Patients # [phone]

## 2017-10-04 NOTE — Telephone Encounter (Signed)
Ok continue out of work for 2 weeks

## 2017-10-04 NOTE — Telephone Encounter (Signed)
Please advise on work note.

## 2017-10-04 NOTE — Telephone Encounter (Signed)
I called patient. He would like to remain out of work with return to work date on November 13, 2017. He states that his physical therapist said he should be good to go by then.  Please advise.

## 2017-10-04 NOTE — Telephone Encounter (Signed)
ok 

## 2017-10-05 NOTE — Telephone Encounter (Signed)
Note at front for pick up. I left voicemail for patient advising. 

## 2017-10-07 DIAGNOSIS — M7542 Impingement syndrome of left shoulder: Secondary | ICD-10-CM | POA: Diagnosis not present

## 2017-10-11 ENCOUNTER — Telehealth (INDEPENDENT_AMBULATORY_CARE_PROVIDER_SITE_OTHER): Payer: Self-pay | Admitting: Orthopedic Surgery

## 2017-10-11 NOTE — Telephone Encounter (Signed)
Patient called asked if Dr Lajoyce Cornersuda will write him a note stating any restrictions he has. Patient said his employer want to know why he can not sit at his desk for eight  Hours. The surgery was on his left shoulder. Patient asked if he can pick the note up  today and email it to his employer. The number to contact patient is [phone]

## 2017-10-12 ENCOUNTER — Encounter: Payer: Self-pay | Admitting: Family Medicine

## 2017-10-12 ENCOUNTER — Telehealth: Payer: Self-pay

## 2017-10-12 ENCOUNTER — Telehealth (INDEPENDENT_AMBULATORY_CARE_PROVIDER_SITE_OTHER): Payer: Self-pay

## 2017-10-12 DIAGNOSIS — G4733 Obstructive sleep apnea (adult) (pediatric): Secondary | ICD-10-CM | POA: Diagnosis not present

## 2017-10-12 NOTE — Telephone Encounter (Signed)
I called and sw pt to advise of Dr. Audrie Liauda's message below. Will call with questions.

## 2017-10-12 NOTE — Telephone Encounter (Signed)
Called pt back and he wanted me to speak with wife Gala Murdochanisha. She advise me that pt's job is trying to get him to go back to work tomorrow even though his Ortho doc said he could be out until the end of Aug and come back in Sept due to PT's recommendations. (see Ortho Phone note) Gala Murdochanisha said that [employer] is needing a letter of why he needs his leave extended. PT advise pt that if he goes back to soon and they haven't "soften up his scare tissue" that he could suffer a frozen shoulder. Pt and wife are wanting Dr. Milinda Antisower to write the letter [employer] is asking for and also filling out what ever form they are requiring due to Ortho not doing. I advised wife that this usually comes from doc that took him out of leave but they still want Dr. Milinda Antisower to do this because they said Ortho will release him soon and since Dr. Milinda Antisower is primary they want her to do it They said that the could drop off the PT notes stating that he should be out until the end of Sept. Pt said if you are not able to help with letter and form that they would need a letter of accomodation if he returns and is requesting that pt at least get this from Dr. Milinda Antisower also. Gala Murdochanisha also stated that [employer] is wanting to know pt's limitations and she doesn't know how Ortho would be able to know that if he doesn't have an appt until next week with them

## 2017-10-12 NOTE — Telephone Encounter (Signed)
Copied from CRM [id number]. Topic: General - Other >> Oct 12, 2017  4:56 PM Marylen PontoMcneil, Ja-Kwan wrote: Reason for CRM: Synthia InnocentBrittany Wimberly PT with [facility] states pt should return back to work in one more month (September). Cb# [phone]

## 2017-10-12 NOTE — Telephone Encounter (Signed)
Pt's wife send an email to surgical scheduler after pt advised that Dr. Lajoyce Cornersuda states that he can work at his desk following his shoulder scope surgery. In the email it was stated that she was not sure that I was understanding what the request was and that it was not being relayed properly to Dr. Lajoyce Cornersuda. I called the pt back after speaking with cheryl and with her present. I advised that any and all messages that are relayed to the pt are from Dr. Lajoyce Cornersuda. The information is given to him and the doctor makes the decisions on care, work restrictions or the lack thereof. I advised that when he came in for his last visit that Dr. Lajoyce Cornersuda advised out of work till September 1st because the pt did npt feel that he could return. When asked for restrictions and medical reason he is unable to return to work there is no supporting reason that the pt can not participate as desk duty. The pt voiced understanding and again advised that the process is to relay all information to the doctor and then he will determine the information to be given to the pt. Advised that we can write a work note to allow for him to attend his physical therapy appointments. He will pick this up tomorrow.

## 2017-10-12 NOTE — Telephone Encounter (Signed)
Yes for seated light duty work 8 hs/ day

## 2017-10-12 NOTE — Telephone Encounter (Signed)
Patient was a shoulder scope 06/2017. Patient is requesting a note with work restrictions. He states that his employer wants to know why he can not sit at a desk for 8 hours a day?

## 2017-10-13 ENCOUNTER — Other Ambulatory Visit (INDEPENDENT_AMBULATORY_CARE_PROVIDER_SITE_OTHER): Payer: Self-pay

## 2017-10-13 ENCOUNTER — Telehealth (INDEPENDENT_AMBULATORY_CARE_PROVIDER_SITE_OTHER): Payer: Self-pay

## 2017-10-13 NOTE — Telephone Encounter (Signed)
Letter at the front desk

## 2017-10-13 NOTE — Telephone Encounter (Signed)
Therapist called and states that she received a phone call from the pt and that he is very frustrated about the phone call from yesterday. She states that she wanted to make sure that Dr. Lajoyce Cornersuda had all of the information about the pt's status and to see if he felt like a return to work date of 11/13/17 would be appropriate. 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 She advised that if Dr. Lajoyce Cornersuda said the pt could return to work they would work with him more aggressively at therapy but that he still needed to again work on maintaining strength for his posture and ROM that he does have the ROM to do his job but it is still not at 100%. Advised that the pt has an appt with Dr. Lajoyce Cornersuda on Monday and that I will document all of this information so he can discuss this with the pt at the time of his appt.

## 2017-10-17 ENCOUNTER — Telehealth (INDEPENDENT_AMBULATORY_CARE_PROVIDER_SITE_OTHER): Payer: Self-pay

## 2017-10-17 ENCOUNTER — Ambulatory Visit (INDEPENDENT_AMBULATORY_CARE_PROVIDER_SITE_OTHER): Payer: BLUE CROSS/BLUE SHIELD | Admitting: Orthopedic Surgery

## 2017-10-17 NOTE — Telephone Encounter (Signed)
Pt cx appt with Dr. Lajoyce Cornersuda today and resch on 10/24/17. appt today was to discuss work status. A letter has already been given to pt return to work  Full time but must be permitted to attend physical therapy appts several times a week. [facility] had called to discuss findings and suggestions with Dr. Lajoyce Cornersuda this has been documented in the chart and will discuss next Monday.

## 2017-10-21 DIAGNOSIS — M7542 Impingement syndrome of left shoulder: Secondary | ICD-10-CM | POA: Diagnosis not present

## 2017-10-24 ENCOUNTER — Ambulatory Visit (INDEPENDENT_AMBULATORY_CARE_PROVIDER_SITE_OTHER): Payer: BLUE CROSS/BLUE SHIELD | Admitting: Orthopedic Surgery

## 2017-11-22 ENCOUNTER — Ambulatory Visit (INDEPENDENT_AMBULATORY_CARE_PROVIDER_SITE_OTHER): Payer: BLUE CROSS/BLUE SHIELD | Admitting: Orthopedic Surgery

## 2017-11-23 ENCOUNTER — Telehealth: Payer: Self-pay | Admitting: Family Medicine

## 2017-11-23 NOTE — Telephone Encounter (Signed)
PT dropped off forms to be reviewed and signed for work accommodations. Ppw was filled out when dropped off. Last OV was 05/07/15. Has seen Dr Lajoyce Corners 06/16/17. I placed on cart for drop off.

## 2017-11-24 NOTE — Telephone Encounter (Signed)
In your inbox.

## 2017-11-24 NOTE — Telephone Encounter (Signed)
He said he checked already and you can leave those questions blank he only needed the last part filled out which was asking about the daily breaks, form back in your inbox

## 2017-11-24 NOTE — Telephone Encounter (Signed)
Question 4 asks if employee is likely to miss work intermittently due to condition or treatment  I cannot answer the Full days or partial Days question as I do not know his schedule re: PT or orthopedic f/u   I assume the missed days are for medical f/u appointments for his shoulder/arm   Daily breaks are fine/make sense  Please ask him to guide me in terms of question 4

## 2017-11-24 NOTE — Telephone Encounter (Signed)
I will look at it when I get it

## 2017-11-25 ENCOUNTER — Encounter: Payer: Self-pay | Admitting: Family Medicine

## 2017-11-25 NOTE — Telephone Encounter (Signed)
See mychart message, form placed back in your inbox

## 2017-11-25 NOTE — Telephone Encounter (Signed)
Dr. Milinda Antisower signed forms and gave them to me. Form faxed and pt's wife notified and a copy was placed at the front for pick up

## 2017-12-05 ENCOUNTER — Ambulatory Visit (INDEPENDENT_AMBULATORY_CARE_PROVIDER_SITE_OTHER): Payer: Self-pay | Admitting: Orthopedic Surgery

## 2017-12-06 ENCOUNTER — Encounter: Payer: Self-pay | Admitting: Family Medicine

## 2017-12-07 ENCOUNTER — Other Ambulatory Visit: Payer: Self-pay | Admitting: Family Medicine

## 2017-12-08 MED ORDER — TRAMADOL HCL 50 MG PO TABS: 50.0000 mg | ORAL_TABLET | Freq: Two times a day (BID) | ORAL | 0 refills | Status: DC | PRN

## 2017-12-14 ENCOUNTER — Ambulatory Visit (INDEPENDENT_AMBULATORY_CARE_PROVIDER_SITE_OTHER): Payer: BLUE CROSS/BLUE SHIELD

## 2017-12-14 DIAGNOSIS — Z23 Encounter for immunization: Secondary | ICD-10-CM | POA: Diagnosis not present

## 2018-01-10 ENCOUNTER — Telehealth: Payer: Self-pay | Admitting: *Deleted

## 2018-01-10 NOTE — Telephone Encounter (Signed)
Wife called and said that Dr. Milinda Antis needs to change pt's accomodation on his FMLA form (question #4), it should say pt needs 5 full days off and 10 partial days. She said you don't need to re-do the whole form just mark through it and sign off on the changes. (no reason given for change) wife said if you have any questions you can send her a mychart, last FMLA form printed and placed in Dr. Royden Purl inbox

## 2018-01-10 NOTE — Telephone Encounter (Signed)
Done and in IN box 

## 2018-01-11 ENCOUNTER — Other Ambulatory Visit: Payer: Self-pay | Admitting: Family Medicine

## 2018-01-11 MED ORDER — TRAMADOL HCL 50 MG PO TABS: 50.0000 mg | ORAL_TABLET | Freq: Two times a day (BID) | ORAL | 0 refills | Status: DC | PRN

## 2018-01-11 NOTE — Telephone Encounter (Signed)
Be very cautious as this is habit forming and sedating as well  Please only take 2 pills if ABSOLUTELY necessary  I will write for inc # of pills and see how long it lasts him

## 2018-01-11 NOTE — Telephone Encounter (Signed)
Form faxed

## 2018-01-11 NOTE — Telephone Encounter (Signed)
See my chart message

## 2018-01-15 ENCOUNTER — Encounter: Payer: Self-pay | Admitting: Family Medicine

## 2018-01-16 ENCOUNTER — Encounter: Payer: Self-pay | Admitting: Family Medicine

## 2018-01-16 NOTE — Telephone Encounter (Signed)
Called pt back and he wanted to check the status of his mychart message. He would like Dr. Milinda Antis to review it before he comes in, I scheduled an appt tomorrow at 10:00am just incase but pt is requesting medication if possible instead of an appt. Pt said he is having sever lower back pain that radiating down his left leg and he is having numbness, tingling, and pain in his leg too, please send pt a mychart message and let him know what to do, or ? If he needs to keep his appt tomorrow

## 2018-01-16 NOTE — Telephone Encounter (Signed)
Patient called stating that he sent a my chart message about his low back pain and leg numbness. Patient stated that this started about 2 weeks ago, but has gotten worse in the last couple of days. Patient stated that he would like to be seen today if possible or worked in [employer] account [employer].

## 2018-01-17 ENCOUNTER — Ambulatory Visit (INDEPENDENT_AMBULATORY_CARE_PROVIDER_SITE_OTHER): Payer: BLUE CROSS/BLUE SHIELD | Admitting: Family Medicine

## 2018-01-17 ENCOUNTER — Ambulatory Visit (INDEPENDENT_AMBULATORY_CARE_PROVIDER_SITE_OTHER)
Admission: RE | Admit: 2018-01-17 | Discharge: 2018-01-17 | Disposition: A | Discharge status: Home / Self Care | Payer: BLUE CROSS/BLUE SHIELD | Source: Ambulatory Visit | Attending: Family Medicine | Admitting: Family Medicine

## 2018-01-17 ENCOUNTER — Encounter: Payer: Self-pay | Admitting: Family Medicine

## 2018-01-17 VITALS — BP 132/80 | HR 67 | Temp 98.3°F | Ht 73.0 in | Wt 321.0 lb

## 2018-01-17 DIAGNOSIS — M5442 Lumbago with sciatica, left side: Secondary | ICD-10-CM

## 2018-01-17 DIAGNOSIS — I83813 Varicose veins of bilateral lower extremities with pain: Secondary | ICD-10-CM | POA: Diagnosis not present

## 2018-01-17 DIAGNOSIS — I10 Essential (primary) hypertension: Secondary | ICD-10-CM

## 2018-01-17 DIAGNOSIS — M545 Low back pain, unspecified: Secondary | ICD-10-CM | POA: Insufficient documentation

## 2018-01-17 DIAGNOSIS — I8393 Asymptomatic varicose veins of bilateral lower extremities: Secondary | ICD-10-CM | POA: Insufficient documentation

## 2018-01-17 MED ORDER — METAXALONE 800 MG PO TABS: 400.0000 mg | ORAL_TABLET | Freq: Three times a day (TID) | ORAL | 1 refills | Status: DC | PRN

## 2018-01-17 NOTE — Assessment & Plan Note (Signed)
Discomfort but no ulceration  Enc to elevate feet when sitting (if possible)  supp hose px given (to waist 10-15 mm Hg) as tolerated to wear during the day  Continue to follow Wt loss enc

## 2018-01-17 NOTE — Assessment & Plan Note (Signed)
BMI of 42.3 Has gained wt in process of shoulder surgeries and disability Discussed how this problem influences overall health and the risks it imposes  Reviewed plan for weight loss with lower calorie diet (via better food choices and also portion control or program like weight watchers) and exercise building up to or more than 30 minutes 5 days per week including some aerobic activity   Enc exercise that is tolerated

## 2018-01-17 NOTE — Assessment & Plan Note (Signed)
New low back pain - bad enough to prevent work since Monday Radiates to L leg  Exam consistent with lumbar spasm Xray today in light of severity  No neuro findings Recommend heat and stretching  Already takes tramadol  Px skelaxin to try for spasm - warned of sedation  Would benefit from PT - he already goes for shoulder- will call for order if needed  Update if not starting to improve in a week or if worsening   Pend rad rev

## 2018-01-17 NOTE — Patient Instructions (Addendum)
Try support hose for veins -may help discomfort and keep them from getting worse   Also elevate legs when whenever you can  Weight loss helps also   For back - xray today  Use ice and heat intermittently for 10 minutes at a time  Slow walking is the best thing to do   Try skelaxin for a muscle relaxer  - watch for sedation   Physical therapy would help - if already going please tell them about your back   Take a look at the back exercises/stretches as well   Avoid twisting and lifting at the same time   Update if not starting to improve in a week or if worsening

## 2018-01-17 NOTE — Progress Notes (Signed)
Subjective:    Patient ID: Jorge Stevens, male    DOB: 04-Sep-1985, 32 y.o.   MRN: [id number]  HPI Here for back pain and varicose vein problem in leg   Wt Readings from Last 3 Encounters:  01/17/18 (!) 321 lb (145.6 kg)  08/15/17 (!) 318 lb (144.2 kg)  07/12/17 (!) 318 lb (144.2 kg)   42.35 kg/m   bp up today  BP Readings from Last 3 Encounters:  01/17/18 (!) 146/84  07/07/17 (!) 146/96  06/29/17 (!) 167/78   Back problem for 2 weeks  Went to [place]- walking out of car Low back had pain/pinching sensation and then tingling down L leg  Got a little better Then worse again since Saturday   Now whole lower back hurts  Sharp pain  Worse to walk  Hurts with sitting as well (tried putting pillows behind back)  Nothing makes it better   Already takes tramadol 1-2 pills twice daily  Taking ibuprofen 800 mg once daily  No tylenol   Chronic shoulder pain -worse with weather change    Has a varicose vein L leg- ? Worse lately /noticed yesterday  No redness   Patient Active Problem List   Diagnosis Date Noted  . Varicose veins of both lower extremities 01/17/2018  . Low back pain 01/17/2018  . Rotator cuff tear arthropathy, left 07/06/2017  . Impingement syndrome of left shoulder   . Frozen shoulder 05/07/2015  . Screening for HIV (human immunodeficiency virus) 03/31/2015  . Hyperglycemia 03/23/2015  . Pruritus 11/15/2014  . Diabetes mellitus screening 06/21/2014  . Chronic bursitis of left shoulder 04/23/2014  . Frequent loose stools 09/26/2012  . Hyperlipidemia 09/26/2012  . Hypertension, essential 12/16/2010  . Routine general medical examination at a health care facility 12/07/2010  . Anal fissure 08/13/2010  . Constipation 08/13/2010  . OSA on CPAP 12/01/2008  . WISDOM TEETH EXTRACTION, HX OF 11/26/2008  . Obesity 07/28/2006   Past Medical History:  Diagnosis Date  . Anxiety   . Morbid obesity with BMI of 45.0-49.9, adult (HCC)   . Other  alteration of consciousness   . Other specified personal history presenting hazards to health(V15.89)   . Shoulder impingement syndrome, left   . Unspecified sleep apnea    Past Surgical History:  Procedure Laterality Date  . SHOULDER ARTHROSCOPY Left 07/06/2017   Procedure: LEFT SHOULDER ARTHROSCOPY, DEBRIDEMENT, AND DECOMPRESSION;  Surgeon: Nadara Mustard, MD;  Location: [facility] OR;  Service: Orthopedics;  Laterality: Left;  . SHOULDER ARTHROSCOPY W/ SUBACROMIAL DECOMPRESSION AND DISTAL CLAVICLE EXCISION Left    Dr. Kristeen Miss  . TONSILLECTOMY    . WISDOM TOOTH EXTRACTION     Social History   Tobacco Use  . Smoking status: Former Smoker    Packs/day: 1.50    Years: 6.00    Pack years: 9.00    Types: Cigarettes    Last attempt to quit: 03/16/2007    Years since quitting: 10.8  . Smokeless tobacco: Never Used  Substance Use Topics  . Alcohol use: Yes    Alcohol/week: 0.0 standard drinks    Comment: Occasional  . Drug use: No   Family History  Problem Relation Age of Onset  . Hyperlipidemia Father   . Hyperlipidemia Mother   . Diabetes Unknown        grandparents  . Throat cancer Unknown        grandmother  . Lung cancer Unknown        grandfather  Allergies  Allergen Reactions  . Robaxin [Methocarbamol]     Sleepy   . Ambien [Zolpidem Tartrate] Itching   Current Outpatient Medications on File Prior to Visit  Medication Sig Dispense Refill  . fluticasone (FLONASE) 50 MCG/ACT nasal spray Place 2 sprays into both nostrils daily. (Patient taking differently: Place 2 sprays into both nostrils daily as needed for allergies. ) 16 g 1  . ibuprofen (ADVIL,MOTRIN) 800 MG tablet Take 1 tablet (800 mg total) by mouth every 8 (eight) hours as needed for moderate pain (with a meal). 90 tablet 5  . NON FORMULARY CPAP 12 cm, full face mask     . Soft Lens Products (REWETTING DROPS) SOLN Apply 1 drop to eye daily as needed (for dry lenses/lubricant eyes.).    Marland Kitchen traMADol (ULTRAM) 50 MG  tablet Take 1-2 tablets (50-100 mg total) by mouth 2 (two) times daily as needed for moderate pain or severe pain. 100 tablet 0   No current facility-administered medications on file prior to visit.     Review of Systems  Constitutional: Negative for activity change, appetite change, fatigue, fever and unexpected weight change.  HENT: Negative for congestion, rhinorrhea, sore throat and trouble swallowing.   Eyes: Negative for pain, redness, itching and visual disturbance.  Respiratory: Negative for cough, chest tightness, shortness of breath and wheezing.   Cardiovascular: Negative for chest pain and palpitations.  Gastrointestinal: Negative for abdominal pain, blood in stool, constipation, diarrhea and nausea.  Endocrine: Negative for cold intolerance, heat intolerance, polydipsia and polyuria.  Genitourinary: Negative for difficulty urinating, dysuria, frequency and urgency.  Musculoskeletal: Positive for arthralgias and back pain. Negative for gait problem, joint swelling and myalgias.       Chronic L shoulder pain s/p multiple surgeries  Low back pain rad to L leg   Skin: Negative for pallor and rash.  Neurological: Negative for dizziness, tremors, weakness, numbness and headaches.       Tingling L leg   Hematological: Negative for adenopathy. Does not bruise/bleed easily.  Psychiatric/Behavioral: Negative for decreased concentration and dysphoric mood. The patient is not nervous/anxious.        Objective:   Physical Exam  Constitutional: He appears well-developed and well-nourished. No distress.  HENT:  Head: Normocephalic and atraumatic.  Eyes: Pupils are equal, round, and reactive to light. Conjunctivae and EOM are normal. No scleral icterus.  Neck: Normal range of motion. Neck supple.  Cardiovascular: Normal rate, regular rhythm and intact distal pulses.  Moderate varicosities bilateral post LEs -compressible from thigh to calf  No skin changes Mild tenderness No warmth or  leg swelling  Pulmonary/Chest: Effort normal and breath sounds normal. He has no wheezes. He has no rales.  Abdominal: Soft. Bowel sounds are normal. He exhibits no distension. There is no tenderness.  Musculoskeletal: He exhibits tenderness.       Right shoulder: He exhibits decreased range of motion, tenderness, bony tenderness and spasm. He exhibits no swelling, no crepitus, no deformity, normal pulse and normal strength.       Lumbar back: He exhibits decreased range of motion, tenderness and spasm. He exhibits no bony tenderness and no edema.  Tender over mid LS and L>R lumbar musculature with spasm  SLR causes back pain and upper thigh pain on L  Nl rom of hip  Nl gait  Flex 90 deg Ext 10-20 deg Pain on lat bend -both directions   Lymphadenopathy:    He has no cervical adenopathy.  Neurological: He is alert.  He has normal strength and normal reflexes. He displays no atrophy. No cranial nerve deficit or sensory deficit. He exhibits normal muscle tone. Coordination normal.  Skin: Skin is warm and dry. No rash noted. No erythema. No pallor.  Psychiatric: He has a normal mood and affect.          Assessment & Plan:   Problem List Items Addressed This Visit      Cardiovascular and Mediastinum   Hypertension, essential    bp in fair control at this time  BP Readings from Last 1 Encounters:  01/17/18 132/80   No changes needed Most recent labs reviewed  Disc lifstyle change with low sodium diet and exercise  Enc wt loss       Varicose veins of both lower extremities    Discomfort but no ulceration  Enc to elevate feet when sitting (if possible)  supp hose px given (to waist 10-15 mm Hg) as tolerated to wear during the day  Continue to follow Wt loss enc         Other   Low back pain - Primary    New low back pain - bad enough to prevent work since Monday Radiates to L leg  Exam consistent with lumbar spasm Xray today in light of severity  No neuro  findings Recommend heat and stretching  Already takes tramadol  Px skelaxin to try for spasm - warned of sedation  Would benefit from PT - he already goes for shoulder- will call for order if needed  Update if not starting to improve in a week or if worsening   Pend rad rev      Relevant Medications   metaxalone (SKELAXIN) 800 MG tablet   Other Relevant Orders   DG Lumbar Spine Complete (Completed)   Morbid obesity (HCC)    BMI of 42.3 Has gained wt in process of shoulder surgeries and disability Discussed how this problem influences overall health and the risks it imposes  Reviewed plan for weight loss with lower calorie diet (via better food choices and also portion control or program like weight watchers) and exercise building up to or more than 30 minutes 5 days per week including some aerobic activity   Enc exercise that is tolerated

## 2018-01-17 NOTE — Assessment & Plan Note (Signed)
bp in fair control at this time  BP Readings from Last 1 Encounters:  01/17/18 132/80   No changes needed Most recent labs reviewed  Disc lifstyle change with low sodium diet and exercise  Enc wt loss

## 2018-01-19 ENCOUNTER — Telehealth: Payer: Self-pay | Admitting: Family Medicine

## 2018-01-19 DIAGNOSIS — M5442 Lumbago with sciatica, left side: Secondary | ICD-10-CM

## 2018-01-19 NOTE — Telephone Encounter (Signed)
Order done Will route to [facility] 

## 2018-01-19 NOTE — Telephone Encounter (Signed)
-----   Message from Shon Millet, [place] sent at 01/18/2018  4:50 PM EST ----- Pt notified of Dr. Royden Purl recommendations. Pt does want [facility] he has an eval scheduled for next week so if we put the referral in for his back they can check that out too, I advise pt our [facility] will f/u with him, please put order in

## 2018-01-20 NOTE — Telephone Encounter (Signed)
Called patient and he already has Appt on 01/26/18 at 4pm. PT order faxed to [facility]

## 2018-01-26 ENCOUNTER — Encounter: Payer: Self-pay | Admitting: Family Medicine

## 2018-01-27 NOTE — Telephone Encounter (Addendum)
Called pt back and he is requesting you give him a work note. Pt said he is still dealing with the pain/ issues he saw you for and now the pain is radiating to his shoulder too, he is still going to PT but it's not helping much. Pt said he is starting to have panic attacks and sever anxiety because his dad has MS and he is very worried that this is the start of him getting MS. Pt said he isn't sleeping he said the muscle relaxer does help him relax his muscles but he isn't sleeping. Pt said he has reached out to Dr. Carie CaddyKaur's office who has prescribed his xanax in the past but he can't get in with her office until late Dec, early Jan so he is also requesting that you send in a Rx for xanax to help him with his anxiety and help him sleep. Pt said he worked 1/2 day on Tuesday and left early and hasn't been back to work all week so he is requesting a doctor's note excusing him for this week.  Pt said if you have any questions you can send him a mychart message and he will respond   **printed out a copy of his control sub fills from the state database and placed it in Dr. Royden Purlower's inbox for review

## 2018-01-30 NOTE — Telephone Encounter (Signed)
Sent pt a mychart message letting him know Dr. Royden Purlower's comments also placed letter at the front for pick up

## 2018-01-30 NOTE — Telephone Encounter (Signed)
Please let pt know  I cannot refill your xanax since it is a controlled substance that comes from psychiatry   I printed the letter for fax or pick up   I think we should refer you to orthopedics for your back at this point since your symptoms are severe   We will call to check in

## 2018-01-31 ENCOUNTER — Encounter: Payer: Self-pay | Admitting: Family Medicine

## 2018-01-31 DIAGNOSIS — M5442 Lumbago with sciatica, left side: Secondary | ICD-10-CM

## 2018-01-31 NOTE — Telephone Encounter (Signed)
Please ask if ok with orthopedic referral  I am concerned about his back pain  Thanks  Letter done

## 2018-01-31 NOTE — Telephone Encounter (Signed)
Referral done to orthopedics [facility]

## 2018-01-31 NOTE — Telephone Encounter (Signed)
See pt's mychart response he does want to see Ortho and he list who he wants to see in mychart message

## 2018-02-20 ENCOUNTER — Ambulatory Visit (INDEPENDENT_AMBULATORY_CARE_PROVIDER_SITE_OTHER): Payer: Self-pay | Admitting: Orthopedic Surgery

## 2018-03-03 ENCOUNTER — Other Ambulatory Visit: Payer: Self-pay | Admitting: Family Medicine

## 2018-03-03 MED ORDER — TRAMADOL HCL 50 MG PO TABS: 50.0000 mg | ORAL_TABLET | Freq: Two times a day (BID) | ORAL | 0 refills | Status: DC | PRN

## 2018-03-03 NOTE — Telephone Encounter (Signed)
Name of Medication: tramadol 50 mg Name of Pharmacy: [facility] Last Fill or Written Date and Quantity: # 100 on 01/11/18 Last Office Visit and Type: 01/17/18 Next Office Visit and Type: none scheduled Last Controlled Substance Agreement Date: none Last ZOX:WRUEDS:none

## 2018-03-22 ENCOUNTER — Ambulatory Visit: Payer: BLUE CROSS/BLUE SHIELD | Admitting: Family Medicine

## 2018-04-26 ENCOUNTER — Telehealth: Payer: Self-pay | Admitting: *Deleted

## 2018-04-26 MED ORDER — PROMETHAZINE HCL 25 MG PO TABS: 25.0000 mg | ORAL_TABLET | Freq: Three times a day (TID) | ORAL | 0 refills | Status: DC | PRN

## 2018-04-26 NOTE — Telephone Encounter (Signed)
Wife called and advise me that pt has the GI bug. Pt's son Jorge Stevens had it 1st and pt has been taking care of him and pt woke up with it today too. Pt has severe diarrhea and is vomiting. Wife is asking Dr. Milinda Antisower to send in something to help with the nausea and vomiting. Pt is pretty sick and can't come in because he is mainly stuck in the bathroom and she knows that the bug just has to run its course anyway. Pt uses [facility] and would like med called in ASAP because she is on that side of town now

## 2018-04-26 NOTE — Telephone Encounter (Signed)
If any s/s of dehydration or severe abd pain go to ED I sent phenergan- caution of sedation  F/u if no imp

## 2018-04-26 NOTE — Telephone Encounter (Signed)
Pt's wife notified Rx sent and advise of Dr. Royden Purl comments

## 2018-04-27 ENCOUNTER — Encounter: Payer: Self-pay | Admitting: Family Medicine

## 2018-04-28 ENCOUNTER — Encounter: Payer: Self-pay | Admitting: Family Medicine

## 2018-04-28 ENCOUNTER — Ambulatory Visit (INDEPENDENT_AMBULATORY_CARE_PROVIDER_SITE_OTHER): Payer: BLUE CROSS/BLUE SHIELD | Admitting: Family Medicine

## 2018-04-28 VITALS — BP 140/68 | HR 72 | Temp 98.3°F | Ht 73.0 in | Wt 310.4 lb

## 2018-04-28 DIAGNOSIS — M75102 Unspecified rotator cuff tear or rupture of left shoulder, not specified as traumatic: Secondary | ICD-10-CM | POA: Diagnosis not present

## 2018-04-28 DIAGNOSIS — M12812 Other specific arthropathies, not elsewhere classified, left shoulder: Secondary | ICD-10-CM

## 2018-04-28 DIAGNOSIS — M7552 Bursitis of left shoulder: Secondary | ICD-10-CM | POA: Diagnosis not present

## 2018-04-28 DIAGNOSIS — A084 Viral intestinal infection, unspecified: Secondary | ICD-10-CM | POA: Diagnosis not present

## 2018-04-28 NOTE — Patient Instructions (Signed)
Keep drinking fluids in sips (not gulps yet)  When you are ready to eat a bit- stay very bland (BRAT diet is bananas /rice/ apple sauce/toast)  Crackers are ok also  Avoid dairy   Then very slowly advance diet  It is ok not to eat for now   Phenergan is ok for nausea as needed (caution of sedation)   Rest over the weekend  Clean bathrooms with a bleach product   Update if not starting to improve in a week or if worsening

## 2018-04-28 NOTE — Progress Notes (Signed)
Subjective:    Patient ID: Jorge Stevens, male    DOB: 1985-07-13, 33 y.o.   MRN: [id number]  HPI Here for f/u of GI virus/ GI problems Now has a hoarse voice   Also needs an update of his FMLA for shoulder  Needs 8 whole days 8 half days per month   Wt Readings from Last 3 Encounters:  04/28/18 (!) 310 lb 6 oz (140.8 kg)  01/17/18 (!) 321 lb (145.6 kg)  08/15/17 (!) 318 lb (144.2 kg)   40.95 kg/m   Son had a GI bug last week  Started getting sick on Tuesday-  Went home from work and had n/v/d  Cannot look at food yet   He vomited until Thursday am (promethazine helped)  Still has nausea off and on  Still having diarrhea- slowing down a bit this am  No blood in stool  Hemorrhoids hurt   Has waves of abdominal pain on and off (before diarrhea also)   He had body aches/ chills -suspected fever   Very hoarse also   Drinking gatorade  Taking sips   Patient Active Problem List   Diagnosis Date Noted  . Viral gastroenteritis 04/28/2018  . Varicose veins of both lower extremities 01/17/2018  . Low back pain 01/17/2018  . Rotator cuff tear arthropathy, left 07/06/2017  . Impingement syndrome of left shoulder   . Frozen shoulder 05/07/2015  . Screening for HIV (human immunodeficiency virus) 03/31/2015  . Hyperglycemia 03/23/2015  . Pruritus 11/15/2014  . Diabetes mellitus screening 06/21/2014  . Chronic bursitis of left shoulder 04/23/2014  . Frequent loose stools 09/26/2012  . Hyperlipidemia 09/26/2012  . Hypertension, essential 12/16/2010  . Routine general medical examination at a health care facility 12/07/2010  . Anal fissure 08/13/2010  . Constipation 08/13/2010  . OSA on CPAP 12/01/2008  . WISDOM TEETH EXTRACTION, HX OF 11/26/2008  . Morbid obesity (HCC) 07/28/2006   Past Medical History:  Diagnosis Date  . Anxiety   . Morbid obesity with BMI of 45.0-49.9, adult (HCC)   . Other alteration of consciousness   . Other specified personal history  presenting hazards to health(V15.89)   . Shoulder impingement syndrome, left   . Unspecified sleep apnea    Past Surgical History:  Procedure Laterality Date  . SHOULDER ARTHROSCOPY Left 07/06/2017   Procedure: LEFT SHOULDER ARTHROSCOPY, DEBRIDEMENT, AND DECOMPRESSION;  Surgeon: Nadara Mustarduda, Marcus V, MD;  [facility];  Service: Orthopedics;  Laterality: Left;  . SHOULDER ARTHROSCOPY W/ SUBACROMIAL DECOMPRESSION AND DISTAL CLAVICLE EXCISION Left    Dr. Kristeen Missan Caffrey  . TONSILLECTOMY    . WISDOM TOOTH EXTRACTION     Social History   Tobacco Use  . Smoking status: Former Smoker    Packs/day: 1.50    Years: 6.00    Pack years: 9.00    Types: Cigarettes    Last attempt to quit: 03/16/2007    Years since quitting: 11.1  . Smokeless tobacco: Never Used  Substance Use Topics  . Alcohol use: Yes    Alcohol/week: 0.0 standard drinks    Comment: Occasional  . Drug use: No   Family History  Problem Relation Age of Onset  . Hyperlipidemia Father   . Hyperlipidemia Mother   . Diabetes Unknown        grandparents  . Throat cancer Unknown        grandmother  . Lung cancer Unknown        grandfather   Allergies  Allergen Reactions  .  Robaxin [Methocarbamol]     Sleepy   . Ambien [Zolpidem Tartrate] Itching   Current Outpatient Medications on File Prior to Visit  Medication Sig Dispense Refill  . fluticasone (FLONASE) 50 MCG/ACT nasal spray Place 2 sprays into both nostrils daily. (Patient taking differently: Place 2 sprays into both nostrils daily as needed for allergies. ) 16 g 1  . ibuprofen (ADVIL,MOTRIN) 800 MG tablet Take 1 tablet (800 mg total) by mouth every 8 (eight) hours as needed for moderate pain (with a meal). 90 tablet 5  . metaxalone (SKELAXIN) 800 MG tablet Take 0.5-1 tablets (400-800 mg total) by mouth 3 (three) times daily as needed for muscle spasms. 30 tablet 1  . NON FORMULARY CPAP 12 cm, full face mask     . promethazine (PHENERGAN) 25 MG tablet Take 1 tablet (25 mg  total) by mouth every 8 (eight) hours as needed for nausea or vomiting. Caution of sedation 20 tablet 0  . Soft Lens Products (REWETTING DROPS) SOLN Apply 1 drop to eye daily as needed (for dry lenses/lubricant eyes.).    Marland Kitchen traMADol (ULTRAM) 50 MG tablet Take 1-2 tablets (50-100 mg total) by mouth 2 (two) times daily as needed for moderate pain or severe pain. 100 tablet 0   No current facility-administered medications on file prior to visit.      Review of Systems  Constitutional: Positive for fatigue. Negative for activity change, appetite change, fever and unexpected weight change.  HENT: Negative for congestion, rhinorrhea, sore throat and trouble swallowing.   Eyes: Negative for pain, redness, itching and visual disturbance.  Respiratory: Negative for cough, chest tightness, shortness of breath and wheezing.   Cardiovascular: Negative for chest pain and palpitations.  Gastrointestinal: Positive for abdominal pain, diarrhea, nausea and vomiting. Negative for abdominal distention, anal bleeding, blood in stool, constipation and rectal pain.  Endocrine: Negative for cold intolerance, heat intolerance, polydipsia and polyuria.  Genitourinary: Negative for difficulty urinating, dysuria, frequency and urgency.  Musculoskeletal: Negative for arthralgias, joint swelling and myalgias.  Skin: Negative for pallor and rash.  Neurological: Negative for dizziness, tremors, syncope, weakness, numbness and headaches.       Feels generally weak  Hematological: Negative for adenopathy. Does not bruise/bleed easily.  Psychiatric/Behavioral: Negative for decreased concentration and dysphoric mood. The patient is not nervous/anxious.        Objective:   Physical Exam Constitutional:      General: He is not in acute distress.    Appearance: He is well-developed. He is obese. He is ill-appearing.     Comments: Fatigued appearing   HENT:     Head: Normocephalic and atraumatic.     Nose: Nose normal.      Mouth/Throat:     Mouth: Mucous membranes are moist.     Pharynx: Oropharynx is clear.  Eyes:     General: No scleral icterus.       Right eye: No discharge.        Left eye: No discharge.     Conjunctiva/sclera: Conjunctivae normal.     Pupils: Pupils are equal, round, and reactive to light.  Neck:     Musculoskeletal: Normal range of motion and neck supple.  Cardiovascular:     Rate and Rhythm: Normal rate and regular rhythm.     Heart sounds: Normal heart sounds.  Pulmonary:     Effort: Pulmonary effort is normal. No respiratory distress.     Breath sounds: Normal breath sounds. No wheezing or rales.  Abdominal:  General: Bowel sounds are normal. There is no distension.     Palpations: Abdomen is soft. There is no mass.     Tenderness: There is no abdominal tenderness. There is no guarding or rebound.     Comments: No tenderness Nl bs   Musculoskeletal:     Right lower leg: No edema.     Left lower leg: No edema.     Comments: Limited rom of shoulders and spine due to pain   Lymphadenopathy:     Cervical: No cervical adenopathy.  Skin:    General: Skin is warm and dry.     Capillary Refill: Capillary refill takes less than 2 seconds. Brisk capillary refill     Coloration: Skin is not pale.     Findings: No erythema.  Neurological:     General: No focal deficit present.     Mental Status: He is alert.  Psychiatric:        Mood and Affect: Mood normal.           Assessment & Plan:   Problem List Items Addressed This Visit      Digestive   Viral gastroenteritis - Primary    Overall beginning to improve and vomiting as stopped Enc him to continue phenergan for nausea as needed  Rehydration with water/ gatorade (slow and steady)  When ready to eat , BRAT diet with gradual advancement inst to alert [place] if return of vomiting or abd pain or s/s of dehydration  Handout given Plan on back to work on monday        Musculoskeletal and Integument   Chronic  bursitis of left shoulder    Still partially disabled from shoulder pain  FMLA forms updated today req up to 8 full and 8 half cays off per mo      Rotator cuff tear arthropathy, left    FMLA updated

## 2018-04-29 NOTE — Assessment & Plan Note (Signed)
FMLA updated.

## 2018-04-29 NOTE — Assessment & Plan Note (Signed)
Overall beginning to improve and vomiting as stopped Enc him to continue phenergan for nausea as needed  Rehydration with water/ gatorade (slow and steady)  When ready to eat , BRAT diet with gradual advancement inst to alert [place] if return of vomiting or abd pain or s/s of dehydration  Handout given Plan on back to work on [employer]

## 2018-04-29 NOTE — Assessment & Plan Note (Signed)
Still partially disabled from shoulder pain  FMLA forms updated today req up to 8 full and 8 half cays off per mo

## 2018-05-01 ENCOUNTER — Other Ambulatory Visit: Payer: BLUE CROSS/BLUE SHIELD

## 2018-05-01 ENCOUNTER — Telehealth: Payer: Self-pay | Admitting: Family Medicine

## 2018-05-01 DIAGNOSIS — R7303 Prediabetes: Secondary | ICD-10-CM

## 2018-05-01 DIAGNOSIS — I10 Essential (primary) hypertension: Secondary | ICD-10-CM

## 2018-05-01 DIAGNOSIS — Z0000 Encounter for general adult medical examination without abnormal findings: Secondary | ICD-10-CM

## 2018-05-01 NOTE — Telephone Encounter (Signed)
-----   Message from Alvina Chou sent at 05/01/2018 11:11 AM EST ----- Regarding: Lab orders for Tuesday, 2.18.20 Patient is scheduled for CPX labs, please order future labs, Thanks , Camelia Eng

## 2018-05-02 ENCOUNTER — Other Ambulatory Visit (INDEPENDENT_AMBULATORY_CARE_PROVIDER_SITE_OTHER): Payer: BLUE CROSS/BLUE SHIELD

## 2018-05-02 DIAGNOSIS — I10 Essential (primary) hypertension: Secondary | ICD-10-CM

## 2018-05-02 DIAGNOSIS — R7303 Prediabetes: Secondary | ICD-10-CM

## 2018-05-02 LAB — CBC WITH DIFFERENTIAL/PLATELET
BASOS ABS: 0.1 10*3/uL (ref 0.0–0.1)
BASOS PCT: 1.3 % (ref 0.0–3.0)
EOS PCT: 2.3 % (ref 0.0–5.0)
Eosinophils Absolute: 0.1 10*3/uL (ref 0.0–0.7)
HCT: 45.3 % (ref 39.0–52.0)
Hemoglobin: 15 g/dL (ref 13.0–17.0)
Lymphocytes Relative: 34.6 % (ref 12.0–46.0)
Lymphs Abs: 2 10*3/uL (ref 0.7–4.0)
MCHC: 33.1 g/dL (ref 30.0–36.0)
MCV: 85.7 fl (ref 78.0–100.0)
MONOS PCT: 7 % (ref 3.0–12.0)
Monocytes Absolute: 0.4 10*3/uL (ref 0.1–1.0)
NEUTROS ABS: 3.2 10*3/uL (ref 1.4–7.7)
Neutrophils Relative %: 54.8 % (ref 43.0–77.0)
PLATELETS: 470 10*3/uL — AB (ref 150.0–400.0)
RBC: 5.29 Mil/uL (ref 4.22–5.81)
RDW: 12.9 % (ref 11.5–15.5)
WBC: 5.8 10*3/uL (ref 4.0–10.5)

## 2018-05-02 LAB — LIPID PANEL
CHOL/HDL RATIO: 5
Cholesterol: 162 mg/dL (ref 0–200)
HDL: 31.1 mg/dL — ABNORMAL LOW (ref >39.00)
LDL Cholesterol: 112 mg/dL — ABNORMAL HIGH (ref 0–99)
NONHDL: 131
Triglycerides: 97 mg/dL (ref 0.0–149.0)
VLDL: 19.4 mg/dL (ref 0.0–40.0)

## 2018-05-02 LAB — COMPREHENSIVE METABOLIC PANEL
ALT: 35 U/L (ref 0–53)
AST: 21 U/L (ref 0–37)
Albumin: 4.4 g/dL (ref 3.5–5.2)
Alkaline Phosphatase: 51 U/L (ref 39–117)
BILIRUBIN TOTAL: 0.8 mg/dL (ref 0.2–1.2)
BUN: 8 mg/dL (ref 6–23)
CALCIUM: 9.4 mg/dL (ref 8.4–10.5)
CHLORIDE: 105 meq/L (ref 96–112)
CO2: 28 meq/L (ref 19–32)
Creatinine, Ser: 1.08 mg/dL (ref 0.40–1.50)
GFR: 94.63 mL/min (ref >60.00)
GLUCOSE: 89 mg/dL (ref 70–99)
Potassium: 3.6 mEq/L (ref 3.5–5.1)
Sodium: 141 mEq/L (ref 135–145)
Total Protein: 7.8 g/dL (ref 6.0–8.3)

## 2018-05-02 LAB — TSH: TSH: 0.47 u[IU]/mL (ref 0.35–4.50)

## 2018-05-02 LAB — HEMOGLOBIN A1C: HEMOGLOBIN A1C: 5.7 % (ref 4.6–6.5)

## 2018-05-03 ENCOUNTER — Ambulatory Visit (INDEPENDENT_AMBULATORY_CARE_PROVIDER_SITE_OTHER): Payer: BLUE CROSS/BLUE SHIELD | Admitting: Family Medicine

## 2018-05-03 ENCOUNTER — Encounter: Payer: Self-pay | Admitting: Family Medicine

## 2018-05-03 VITALS — BP 130/80 | HR 71 | Temp 98.6°F | Ht 72.0 in | Wt 313.0 lb

## 2018-05-03 DIAGNOSIS — E7800 Pure hypercholesterolemia, unspecified: Secondary | ICD-10-CM

## 2018-05-03 DIAGNOSIS — R7303 Prediabetes: Secondary | ICD-10-CM

## 2018-05-03 DIAGNOSIS — I10 Essential (primary) hypertension: Secondary | ICD-10-CM | POA: Diagnosis not present

## 2018-05-03 DIAGNOSIS — Z0000 Encounter for general adult medical examination without abnormal findings: Secondary | ICD-10-CM

## 2018-05-03 DIAGNOSIS — R0981 Nasal congestion: Secondary | ICD-10-CM

## 2018-05-03 MED ORDER — FLUTICASONE PROPIONATE 50 MCG/ACT NA SUSP: 2.0000 | Freq: Every day | NASAL | 11 refills | Status: AC | PRN

## 2018-05-03 NOTE — Assessment & Plan Note (Addendum)
Reviewed health habits including diet and exercise and skin cancer prevention Reviewed appropriate screening tests for age  Also reviewed health mt list, fam hx and immunization status , as well as social and family history   Enc further weight loss Also exercise to increase HDL  Labs reviewed

## 2018-05-03 NOTE — Assessment & Plan Note (Signed)
Discussed how this problem influences overall health and the risks it imposes  Reviewed plan for weight loss with lower calorie diet (via better food choices and also portion control or program like weight watchers) and exercise building up to or more than 30 minutes 5 days per week including some aerobic activity   Commended wt loss so far and enc him to keep working on it

## 2018-05-03 NOTE — Patient Instructions (Addendum)
To prevent diabetes and loose weight  Try to get most of your carbohydrates from produce (with the exception of white potatoes)  Eat less bread/pasta/rice/snack foods/cereals/sweets and other items from the middle of the grocery store (processed carbs)   Keep exercising   Eat more fish with fins (not fried) to help good cholesterol (not shellfish) for good cholesterol HDL   For bad cholesterol  Avoid red meat/ fried foods/ egg yolks/ fatty breakfast meats/ butter, cheese and high fat dairy/ and shellfish    Take care of yourself !  Keep loosing weight

## 2018-05-03 NOTE — Assessment & Plan Note (Signed)
Lab Results  Component Value Date   HGBA1C 5.7 05/02/2018   This is stable  disc imp of low glycemic diet and wt loss to prevent DM2

## 2018-05-03 NOTE — Progress Notes (Signed)
Subjective:    Patient ID: Jorge Stevens, male    DOB: 1985-07-13, 33 y.o.   MRN: [id number]  HPI Here for health maintenance exam and to review chronic medical problems    Gastroenteritis is improved  Hemorrhoids are bothering him- had a little bleeding  Taking pepto for diarrhea    Wt Readings from Last 3 Encounters:  05/03/18 (!) 313 lb (142 kg)  04/28/18 (!) 310 lb 6 oz (140.8 kg)  01/17/18 (!) 321 lb (145.6 kg)  wt loss since November  Uses elliptical machine - and that has been helping- 30 to 45 minutes per day Diet is fair - he eats twice daily and probably needs to eat better and more often  42.45 kg/m   He does not eat breakfast   Uses cpap for OSA   Tetanus shot 9/10   HIV screen neg 1/17  Flu shot 10/19   bp is up on first check today (was up when he is sick)  No cp or palpitations or headaches or edema  No side effects to medicines  BP Readings from Last 3 Encounters:  05/03/18 (!) 144/82  04/28/18 140/68  01/17/18 132/80    Not on medication  Formerly took amlodipine-came off of it when he did not need it in 2017 BP on 2nd check BP: 130/80 (improved)   Lab Results  Component Value Date   CREATININE 1.08 05/02/2018   BUN 8 05/02/2018   NA 141 05/02/2018   K 3.6 05/02/2018   CL 105 05/02/2018   CO2 28 05/02/2018   Lab Results  Component Value Date   WBC 5.8 05/02/2018   HGB 15.0 05/02/2018   HCT 45.3 05/02/2018   MCV 85.7 05/02/2018   PLT 470.0 (H) 05/02/2018  baseline - has above average platelets  No bleeding/bruising or clotting problems    Lab Results  Component Value Date   ALT 35 05/02/2018   AST 21 05/02/2018   ALKPHOS 51 05/02/2018   BILITOT 0.8 05/02/2018    Prediabetes Lab Results  Component Value Date   HGBA1C 5.7 05/02/2018  this is stable   Lab Results  Component Value Date   TSH 0.47 05/02/2018      Hyperlipidemia  Lab Results  Component Value Date   CHOL 162 05/02/2018   CHOL 166 04/06/2016   CHOL 179 03/27/2015   Lab Results  Component Value Date   HDL 31.10 (L) 05/02/2018   HDL 26.60 (L) 04/06/2016   HDL 33.90 (L) 03/27/2015   Lab Results  Component Value Date   LDLCALC 112 (H) 05/02/2018   LDLCALC 109 (H) 04/06/2016   LDLCALC 118 (H) 03/27/2015   Lab Results  Component Value Date   TRIG 97.0 05/02/2018   TRIG 152.0 (H) 04/06/2016   TRIG 132.0 03/27/2015   Lab Results  Component Value Date   CHOLHDL 5 05/02/2018   CHOLHDL 6 04/06/2016   CHOLHDL 5 03/27/2015   Lab Results  Component Value Date   LDLDIRECT 156.3 11/24/2012   LDLDIRECT 188.3 09/26/2012   LDLDIRECT 167.8 11/20/2008   Diet controlled  Exercises a lot - HDL is low however  LDL is up just a little  Not a lot of fried food in his diet or red meat  Lots of vegetables   Patient Active Problem List   Diagnosis Date Noted  . Viral gastroenteritis 04/28/2018  . Varicose veins of both lower extremities 01/17/2018  . Low back pain 01/17/2018  . Rotator cuff tear  arthropathy, left 07/06/2017  . Impingement syndrome of left shoulder   . Frozen shoulder 05/07/2015  . Screening for HIV (human immunodeficiency virus) 03/31/2015  . Pre-diabetes 03/23/2015  . Pruritus 11/15/2014  . Diabetes mellitus screening 06/21/2014  . Chronic bursitis of left shoulder 04/23/2014  . Frequent loose stools 09/26/2012  . Hyperlipidemia 09/26/2012  . Hypertension, essential 12/16/2010  . Routine general medical examination at a health care facility 12/07/2010  . Anal fissure 08/13/2010  . Constipation 08/13/2010  . OSA on CPAP 12/01/2008  . WISDOM TEETH EXTRACTION, HX OF 11/26/2008  . Morbid obesity (HCC) 07/28/2006   Past Medical History:  Diagnosis Date  . Anxiety   . Morbid obesity with BMI of 45.0-49.9, adult (HCC)   . Other alteration of consciousness   . Other specified personal history presenting hazards to health(V15.89)   . Shoulder impingement syndrome, left   . Unspecified sleep apnea    Past  Surgical History:  Procedure Laterality Date  . SHOULDER ARTHROSCOPY Left 07/06/2017   Procedure: LEFT SHOULDER ARTHROSCOPY, DEBRIDEMENT, AND DECOMPRESSION;  Surgeon: Nadara Mustarduda, Marcus V, MD;  [facility];  Service: Orthopedics;  Laterality: Left;  . SHOULDER ARTHROSCOPY W/ SUBACROMIAL DECOMPRESSION AND DISTAL CLAVICLE EXCISION Left    Dr. Kristeen Missan Caffrey  . TONSILLECTOMY    . WISDOM TOOTH EXTRACTION     Social History   Tobacco Use  . Smoking status: Former Smoker    Packs/day: 1.50    Years: 6.00    Pack years: 9.00    Types: Cigarettes    Last attempt to quit: 03/16/2007    Years since quitting: 11.1  . Smokeless tobacco: Never Used  Substance Use Topics  . Alcohol use: Yes    Alcohol/week: 0.0 standard drinks    Comment: Occasional  . Drug use: No   Family History  Problem Relation Age of Onset  . Hyperlipidemia Father   . Hyperlipidemia Mother   . Diabetes Unknown        grandparents  . Throat cancer Unknown        grandmother  . Lung cancer Unknown        grandfather   Allergies  Allergen Reactions  . Robaxin [Methocarbamol]     Sleepy   . Ambien [Zolpidem Tartrate] Itching   Current Outpatient Medications on File Prior to Visit  Medication Sig Dispense Refill  . ibuprofen (ADVIL,MOTRIN) 800 MG tablet Take 1 tablet (800 mg total) by mouth every 8 (eight) hours as needed for moderate pain (with a meal). 90 tablet 5  . metaxalone (SKELAXIN) 800 MG tablet Take 0.5-1 tablets (400-800 mg total) by mouth 3 (three) times daily as needed for muscle spasms. 30 tablet 1  . NON FORMULARY CPAP 12 cm, full face mask     . promethazine (PHENERGAN) 25 MG tablet Take 1 tablet (25 mg total) by mouth every 8 (eight) hours as needed for nausea or vomiting. Caution of sedation 20 tablet 0  . Soft Lens Products (REWETTING DROPS) SOLN Apply 1 drop to eye daily as needed (for dry lenses/lubricant eyes.).    Marland Kitchen. traMADol (ULTRAM) 50 MG tablet Take 1-2 tablets (50-100 mg total) by mouth 2 (two)  times daily as needed for moderate pain or severe pain. 100 tablet 0   No current facility-administered medications on file prior to visit.     Review of Systems  Constitutional: Negative for activity change, appetite change, fatigue, fever and unexpected weight change.  HENT: Negative for congestion, rhinorrhea, sore throat and  trouble swallowing.   Eyes: Negative for pain, redness, itching and visual disturbance.  Respiratory: Negative for cough, chest tightness, shortness of breath and wheezing.   Cardiovascular: Negative for chest pain and palpitations.  Gastrointestinal: Negative for abdominal pain, blood in stool, constipation, diarrhea and nausea.  Endocrine: Negative for cold intolerance, heat intolerance, polydipsia and polyuria.  Genitourinary: Negative for difficulty urinating, dysuria, frequency and urgency.  Musculoskeletal: Positive for arthralgias and back pain. Negative for joint swelling and myalgias.       Chronic shoulder and back pain   Skin: Negative for pallor and rash.  Neurological: Negative for dizziness, tremors, weakness, numbness and headaches.  Hematological: Negative for adenopathy. Does not bruise/bleed easily.  Psychiatric/Behavioral: Negative for decreased concentration and dysphoric mood. The patient is not nervous/anxious.        Objective:   Physical Exam Constitutional:      General: He is not in acute distress.    Appearance: Normal appearance. He is well-developed. He is obese. He is not ill-appearing.  HENT:     Head: Normocephalic and atraumatic.     Right Ear: Tympanic membrane, ear canal and external ear normal.     Left Ear: Tympanic membrane, ear canal and external ear normal.     Nose: Nose normal.     Mouth/Throat:     Mouth: Mucous membranes are moist.     Pharynx: Oropharynx is clear.  Eyes:     General: No scleral icterus.       Right eye: No discharge.        Left eye: No discharge.     Conjunctiva/sclera: Conjunctivae normal.       Pupils: Pupils are equal, round, and reactive to light.  Neck:     Musculoskeletal: Normal range of motion and neck supple.     Thyroid: No thyromegaly.     Vascular: No carotid bruit or JVD.  Cardiovascular:     Rate and Rhythm: Normal rate and regular rhythm.     Pulses: Normal pulses.     Heart sounds: Normal heart sounds. No gallop.   Pulmonary:     Effort: Pulmonary effort is normal. No respiratory distress.     Breath sounds: Normal breath sounds. No wheezing.  Chest:     Chest wall: No tenderness.  Abdominal:     General: Bowel sounds are normal. There is no distension or abdominal bruit.     Palpations: Abdomen is soft. There is no mass.     Tenderness: There is no abdominal tenderness.  Musculoskeletal:        General: No tenderness.     Right lower leg: No edema.     Left lower leg: No edema.  Lymphadenopathy:     Cervical: No cervical adenopathy.  Skin:    General: Skin is warm and dry.     Capillary Refill: Capillary refill takes less than 2 seconds.     Coloration: Skin is not pale.     Findings: No erythema or rash.     Comments: Few brown nivi  Neurological:     Mental Status: He is alert. Mental status is at baseline.     Cranial Nerves: No cranial nerve deficit.     Motor: No abnormal muscle tone.     Coordination: Coordination normal.     Deep Tendon Reflexes: Reflexes are normal and symmetric. Reflexes normal.  Psychiatric:        Mood and Affect: Mood normal.  Assessment & Plan:   Problem List Items Addressed This Visit      Cardiovascular and Mediastinum   Hypertension, essential    bp in fair control at this time  BP Readings from Last 1 Encounters:  05/03/18 130/80   Still controlled with lifestyle change (continues to go w/o medication) Most recent labs reviewed  Disc lifstyle change with low sodium diet and exercise  Enc continued wt loss          Other   Morbid obesity (HCC)    Discussed how this problem  influences overall health and the risks it imposes  Reviewed plan for weight loss with lower calorie diet (via better food choices and also portion control or program like weight watchers) and exercise building up to or more than 30 minutes 5 days per week including some aerobic activity   Commended wt loss so far and enc him to keep working on it       Routine general medical examination at a health care facility - Primary    Reviewed health habits including diet and exercise and skin cancer prevention Reviewed appropriate screening tests for age  Also reviewed health mt list, fam hx and immunization status , as well as social and family history   Enc further weight loss Also exercise to increase HDL  Labs reviewed       Hyperlipidemia    Disc goals for lipids and reasons to control them Rev last labs with pt Rev low sat fat diet in detail HDL is low- will continue to work on exercise  Omega 3 may help as well        Pre-diabetes    Lab Results  Component Value Date   HGBA1C 5.7 05/02/2018   This is stable  disc imp of low glycemic diet and wt loss to prevent DM2        Other Visit Diagnoses    Nasal congestion       Relevant Medications   fluticasone (FLONASE) 50 MCG/ACT nasal spray

## 2018-05-03 NOTE — Assessment & Plan Note (Signed)
Disc goals for lipids and reasons to control them Rev last labs with pt Rev low sat fat diet in detail HDL is low- will continue to work on exercise  Omega 3 may help as well

## 2018-05-03 NOTE — Assessment & Plan Note (Signed)
bp in fair control at this time  BP Readings from Last 1 Encounters:  05/03/18 130/80   Still controlled with lifestyle change (continues to go w/o medication) Most recent labs reviewed  Disc lifstyle change with low sodium diet and exercise  Enc continued wt loss

## 2018-05-12 ENCOUNTER — Telehealth: Payer: Self-pay | Admitting: Family Medicine

## 2018-05-12 NOTE — Telephone Encounter (Signed)
He will need to enter a waist measurement In IN box

## 2018-05-12 NOTE — Telephone Encounter (Signed)
Pt dropped off form for [employer] program to be filled out by PCP. I placed in Rx tower, please fax to  [phone] when completed, due 2/29. Last cpe 05/03/18

## 2018-05-12 NOTE — Telephone Encounter (Signed)
In your inbox.

## 2018-05-12 NOTE — Telephone Encounter (Signed)
Called pt and got his wasit circ. Form faxed and placed at the front for pick up and copy sent to scanning

## 2018-06-09 ENCOUNTER — Telehealth: Payer: Self-pay | Admitting: Pulmonary Disease

## 2018-06-09 DIAGNOSIS — G4733 Obstructive sleep apnea (adult) (pediatric): Secondary | ICD-10-CM

## 2018-06-09 NOTE — Telephone Encounter (Signed)
Order for new cpap supplies placed. Nothing further needed.

## 2018-06-11 ENCOUNTER — Other Ambulatory Visit: Payer: Self-pay | Admitting: Family Medicine

## 2018-06-12 NOTE — Telephone Encounter (Signed)
Name of Medication: tramadol 50 mg Name of Pharmacy: [facility] Last Fill or Written Date and Quantity: # 100 tabs with 0 refills on 03/03/18 Last Office Visit and Type: CPE on 05/03/18 Next Office Visit and Type: CPE on 05/09/19 Last Controlled Substance Agreement Date: none Last VQM:GQQP

## 2018-06-26 ENCOUNTER — Ambulatory Visit: Payer: BLUE CROSS/BLUE SHIELD | Admitting: Adult Health

## 2018-06-27 ENCOUNTER — Ambulatory Visit: Payer: BLUE CROSS/BLUE SHIELD | Admitting: Family Medicine

## 2018-06-27 ENCOUNTER — Other Ambulatory Visit: Payer: Self-pay

## 2018-06-27 ENCOUNTER — Encounter: Payer: Self-pay | Admitting: Family Medicine

## 2018-06-27 VITALS — BP 130/80 | HR 83 | Temp 98.6°F | Ht 72.0 in | Wt 321.1 lb

## 2018-06-27 DIAGNOSIS — M12812 Other specific arthropathies, not elsewhere classified, left shoulder: Secondary | ICD-10-CM | POA: Diagnosis not present

## 2018-06-27 DIAGNOSIS — M75102 Unspecified rotator cuff tear or rupture of left shoulder, not specified as traumatic: Secondary | ICD-10-CM

## 2018-06-27 DIAGNOSIS — M7552 Bursitis of left shoulder: Secondary | ICD-10-CM | POA: Diagnosis not present

## 2018-06-27 MED ORDER — KETOROLAC TROMETHAMINE 60 MG/2ML IM SOLN
60.0000 mg | Freq: Once | INTRAMUSCULAR | Status: AC
Start: 2018-06-27 — End: 2018-06-27
  Administered 2018-06-27: 16:00:00 60 mg via INTRAMUSCULAR

## 2018-06-27 MED ORDER — METAXALONE 800 MG PO TABS: 400.0000 mg | ORAL_TABLET | Freq: Three times a day (TID) | ORAL | 1 refills | Status: DC | PRN

## 2018-06-27 NOTE — Assessment & Plan Note (Signed)
Acute on chronic L shoulder pain (sees Dr Lajoyce Corners with last surgery 4/19 followed by PT  Today pt has a flare of severe pain (since Thursday) with limited rom (no hx of trauma)  For pain- tramadol 60 mg IM given  He has tramadol to take at home prn  Also refilled skelaxin as I thought he had some spasm on exam  Update if not starting to improve in a week or if worsening   Recommend return to orthopedics if worse or no improvement

## 2018-06-27 NOTE — Patient Instructions (Signed)
Alternate ice and heat for 10 minutes at a time   Take tramadol as needed   Try your muscle relaxer (skelaxin) - to help spasm as directed - I sent to pharmacy (do watch out for sedation   Toradol injection today to jump start relief  If not improving in the next week we will get you back with Dr Lajoyce Corners

## 2018-06-27 NOTE — Assessment & Plan Note (Signed)
Most recent surgery from Dr Lajoyce Corners

## 2018-06-27 NOTE — Progress Notes (Signed)
Subjective:    Patient ID: Jorge Stevens, male    DOB: 1986/02/03, 33 y.o.   MRN: [id number]  HPI Acute on chronic shoulder pain  Worse since Thursday  Locked up on him  Woke him up out of sleep   This sometimes happens with weather ? Arthritis acting up  Has seen Dr Lajoyce Corners in the past  Has done 2 surgeries in the past 3 years (last 4/19) Had last visit there 6/19 - for impingement syndrome, at that time recommended 2 more mo of PT and f/u 2 mo    He is going to be working from home - during the covid pandemic Has been home for 2 weeks    Now pain is severe  This am 10/10 pain- now is 7/10  On average it happens once per week for about a day  This time it is not getting better   toradol has helped in the past  Has not taken any ibuprofen- does not help   Hot shower helps a bit PT helped a bit    Narcotic pain medication in the past   Takes tramadol 1-2 tablets bid prn mod to severe pain  Last refill 100 pills on 3/30  Tends to take 2 a day   When he takes 2 at a time - makes him a bit irritable  It does help in a severe situation   Patient Active Problem List   Diagnosis Date Noted  . Varicose veins of both lower extremities 01/17/2018  . Low back pain 01/17/2018  . Rotator cuff tear arthropathy, left 07/06/2017  . Impingement syndrome of left shoulder   . Pre-diabetes 03/23/2015  . Chronic bursitis of left shoulder 04/23/2014  . Hyperlipidemia 09/26/2012  . Hypertension, essential 12/16/2010  . Routine general medical examination at a health care facility 12/07/2010  . Anal fissure 08/13/2010  . OSA on CPAP 12/01/2008  . WISDOM TEETH EXTRACTION, HX OF 11/26/2008  . Morbid obesity (HCC) 07/28/2006   Past Medical History:  Diagnosis Date  . Anxiety   . Morbid obesity with BMI of 45.0-49.9, adult (HCC)   . Other alteration of consciousness   . Other specified personal history presenting hazards to health(V15.89)   . Shoulder impingement syndrome,  left   . Unspecified sleep apnea    Past Surgical History:  Procedure Laterality Date  . SHOULDER ARTHROSCOPY Left 07/06/2017   Procedure: LEFT SHOULDER ARTHROSCOPY, DEBRIDEMENT, AND DECOMPRESSION;  Surgeon: Nadara Mustard, MD;  Location: [facility] OR;  Service: Orthopedics;  Laterality: Left;  . SHOULDER ARTHROSCOPY W/ SUBACROMIAL DECOMPRESSION AND DISTAL CLAVICLE EXCISION Left    Dr. Kristeen Miss  . TONSILLECTOMY    . WISDOM TOOTH EXTRACTION     Social History   Tobacco Use  . Smoking status: Former Smoker    Packs/day: 1.50    Years: 6.00    Pack years: 9.00    Types: Cigarettes    Last attempt to quit: 03/16/2007    Years since quitting: 11.2  . Smokeless tobacco: Never Used  Substance Use Topics  . Alcohol use: Yes    Alcohol/week: 0.0 standard drinks    Comment: Occasional  . Drug use: No   Family History  Problem Relation Age of Onset  . Hyperlipidemia Father   . Hyperlipidemia Mother   . Diabetes Unknown        grandparents  . Throat cancer Unknown        grandmother  . Lung cancer Unknown  grandfather   Allergies  Allergen Reactions  . Robaxin [Methocarbamol]     Sleepy   . Ambien [Zolpidem Tartrate] Itching   Current Outpatient Medications on File Prior to Visit  Medication Sig Dispense Refill  . fluticasone (FLONASE) 50 MCG/ACT nasal spray Place 2 sprays into both nostrils daily as needed for allergies. 16 g 11  . ibuprofen (ADVIL,MOTRIN) 800 MG tablet Take 1 tablet (800 mg total) by mouth every 8 (eight) hours as needed for moderate pain (with a meal). 90 tablet 5  . NON FORMULARY CPAP 12 cm, full face mask     . promethazine (PHENERGAN) 25 MG tablet Take 1 tablet (25 mg total) by mouth every 8 (eight) hours as needed for nausea or vomiting. Caution of sedation 20 tablet 0  . Soft Lens Products (REWETTING DROPS) SOLN Apply 1 drop to eye daily as needed (for dry lenses/lubricant eyes.).    Marland Kitchen traMADol (ULTRAM) 50 MG tablet TAKE 1 TO 2 TABLETS BY MOUTH TWICE A  DAY AS NEEDED FOR MODERATE TO SEVERE PAIN 100 tablet 0   No current facility-administered medications on file prior to visit.      Review of Systems  Constitutional: Negative for activity change, appetite change and fatigue.  Eyes: Negative for visual disturbance.  Cardiovascular: Negative for chest pain and palpitations.  Gastrointestinal: Negative for abdominal pain.  Musculoskeletal: Positive for arthralgias and myalgias. Negative for joint swelling.       Severe L shoulder pain   Neurological: Negative for dizziness.       Objective:   Physical Exam Constitutional:      General: He is not in acute distress.    Appearance: Normal appearance. He is obese. He is not ill-appearing.  HENT:     Head: Normocephalic and atraumatic.     Mouth/Throat:     Mouth: Mucous membranes are moist.     Pharynx: Oropharynx is clear.  Eyes:     General: No scleral icterus.    Extraocular Movements: Extraocular movements intact.     Conjunctiva/sclera: Conjunctivae normal.     Pupils: Pupils are equal, round, and reactive to light.  Neck:     Musculoskeletal: Neck supple. No neck rigidity or muscular tenderness.  Cardiovascular:     Rate and Rhythm: Normal rate and regular rhythm.  Pulmonary:     Effort: Pulmonary effort is normal. No respiratory distress.     Breath sounds: Normal breath sounds. No wheezing or rales.  Chest:     Chest wall: No tenderness.  Musculoskeletal:        General: Tenderness present. No swelling or deformity.     Left shoulder: He exhibits decreased range of motion, tenderness, bony tenderness, pain and spasm. He exhibits no swelling, no crepitus, normal pulse and normal strength.     Comments: L shoulder tender anteriorly and laterally  No swelling or erythema No crepitus  Scars noted  Holding arm flexed and adducted  Pain to abduct over 30 degrees Flex 10-20 deg, extension same with pain (and great apprehension)  Much pain with hawking's maneuver    Nl  strength and sensation    Neurological:     Mental Status: He is alert.           Assessment & Plan:   Problem List Items Addressed This Visit      Musculoskeletal and Integument   Chronic bursitis of left shoulder - Primary    Acute on chronic L shoulder pain (sees Dr Lajoyce Corners with  last surgery 4/19 followed by PT  Today pt has a flare of severe pain (since Thursday) with limited rom (no hx of trauma)  For pain- tramadol 60 mg IM given  He has tramadol to take at home prn  Also refilled skelaxin as I thought he had some spasm on exam  Update if not starting to improve in a week or if worsening   Recommend return to orthopedics if worse or no improvement       Relevant Medications   ketorolac (TORADOL) injection 60 mg (Completed)   Rotator cuff tear arthropathy, left    Most recent surgery from Dr Lajoyce Cornersuda        Relevant Medications   ketorolac (TORADOL) injection 60 mg (Completed)

## 2018-09-22 ENCOUNTER — Ambulatory Visit: Payer: BLUE CROSS/BLUE SHIELD | Admitting: Adult Health

## 2018-10-06 ENCOUNTER — Ambulatory Visit (INDEPENDENT_AMBULATORY_CARE_PROVIDER_SITE_OTHER): Payer: BC Managed Care – PPO | Admitting: Family Medicine

## 2018-10-06 ENCOUNTER — Other Ambulatory Visit: Payer: Self-pay

## 2018-10-06 DIAGNOSIS — R197 Diarrhea, unspecified: Secondary | ICD-10-CM

## 2018-10-06 MED ORDER — ONDANSETRON HCL 8 MG PO TABS: 8.0000 mg | ORAL_TABLET | Freq: Three times a day (TID) | ORAL | 0 refills | Status: DC | PRN

## 2018-10-06 NOTE — Patient Instructions (Signed)
Try the zofran instead of phenergan Sip fluids Do not try to eat until feeling better  When you do- eat small amounts of bland food like toast or crackers Watch for fever/cough/ loss of taste or smell  Watch for abdominal pain or bloody stool   If worse over the weekend-go get checked out at urgent care or the ER If no improvement on Monday please call /alert [place] so I can order a covid test in town   In the meantime please continue to isolate yourself from the rest of the household

## 2018-10-06 NOTE — Progress Notes (Signed)
Virtual Visit via Video Note  I connected with Jorge Stevens on 10/06/18 at  4:15 PM EDT by a video enabled telemedicine application and verified that I am speaking with the correct person using two identifiers.  Location: Patient: home Provider: office    I discussed the limitations of evaluation and management by telemedicine and the availability of in person appointments. The patient expressed understanding and agreed to proceed.  His video failed and the visit was conducted by phone   History of Present Illness: Here for nausea and diarrhea   At first he thought it was something he ate chipotle on Sunday - steak and chicken (very spicy)  Got bloated   Symptoms started on Monday  No fever or chills  No body aches   No cough  No ST No runny nose   Taste/smell -normal  No appetite   Can tolerate fluids-wife makes him drink   Then nausea and queasy  No vomiting but pretty close --promethazine   No energy - in bed since 12:00 yesterday  He had cramping this am   Diarrhea  -worse if he eats  Every 2 hours if he eats  Stool is loose-not watery  No blood in stool (some on paper from hemorrhoid)   No covid exposures  Working from home  Wears mask if he out   Everyone in house feels good   He is isolated himself in his room   Patient Active Problem List   Diagnosis Date Noted  . Varicose veins of both lower extremities 01/17/2018  . Low back pain 01/17/2018  . Rotator cuff tear arthropathy, left 07/06/2017  . Impingement syndrome of left shoulder   . Pre-diabetes 03/23/2015  . Chronic bursitis of left shoulder 04/23/2014  . Hyperlipidemia 09/26/2012  . Hypertension, essential 12/16/2010  . Routine general medical examination at a health care facility 12/07/2010  . Anal fissure 08/13/2010  . OSA on CPAP 12/01/2008  . WISDOM TEETH EXTRACTION, HX OF 11/26/2008  . Morbid obesity ([place]) 07/28/2006   Past Medical History:  Diagnosis Date  . Anxiety   .  Morbid obesity with BMI of 45.0-49.9, adult ([place])   . Other alteration of consciousness   . Other specified personal history presenting hazards to health(V15.89)   . Shoulder impingement syndrome, left   . Unspecified sleep apnea    Past Surgical History:  Procedure Laterality Date  . SHOULDER ARTHROSCOPY Left 07/06/2017   Procedure: LEFT SHOULDER ARTHROSCOPY, DEBRIDEMENT, AND DECOMPRESSION;  Surgeon: Newt Minion, MD;  Location: [facility];  Service: Orthopedics;  Laterality: Left;  . SHOULDER ARTHROSCOPY W/ SUBACROMIAL DECOMPRESSION AND DISTAL CLAVICLE EXCISION Left    Dr. Flossie Dibble  . TONSILLECTOMY    . WISDOM TOOTH EXTRACTION     Social History   Tobacco Use  . Smoking status: Former Smoker    Packs/day: 1.50    Years: 6.00    Pack years: 9.00    Types: Cigarettes    Quit date: 03/16/2007    Years since quitting: 11.5  . Smokeless tobacco: Never Used  Substance Use Topics  . Alcohol use: Yes    Alcohol/week: 0.0 standard drinks    Comment: Occasional  . Drug use: No   Family History  Problem Relation Age of Onset  . Hyperlipidemia Father   . Hyperlipidemia Mother   . Diabetes Unknown        grandparents  . Throat cancer Unknown        grandmother  . Lung  cancer Unknown        grandfather   Allergies  Allergen Reactions  . Robaxin [Methocarbamol]     Sleepy   . Ambien [Zolpidem Tartrate] Itching   Current Outpatient Medications on File Prior to Visit  Medication Sig Dispense Refill  . fluticasone (FLONASE) 50 MCG/ACT nasal spray Place 2 sprays into both nostrils daily as needed for allergies. 16 g 11  . ibuprofen (ADVIL,MOTRIN) 800 MG tablet Take 1 tablet (800 mg total) by mouth every 8 (eight) hours as needed for moderate pain (with a meal). 90 tablet 5  . metaxalone (SKELAXIN) 800 MG tablet Take 0.5-1 tablets (400-800 mg total) by mouth 3 (three) times daily as needed for muscle spasms. 30 tablet 1  . NON FORMULARY CPAP 12 cm, full face mask     . Soft Lens  Products (REWETTING DROPS) SOLN Apply 1 drop to eye daily as needed (for dry lenses/lubricant eyes.).    Marland Kitchen. traMADol (ULTRAM) 50 MG tablet TAKE 1 TO 2 TABLETS BY MOUTH TWICE A DAY AS NEEDED FOR MODERATE TO SEVERE PAIN 100 tablet 0   No current facility-administered medications on file prior to visit.    Review of Systems  Constitutional: Positive for malaise/fatigue. Negative for chills, diaphoresis, fever and weight loss.  HENT: Negative for sore throat.   Eyes: Negative for blurred vision, discharge and redness.  Respiratory: Negative for cough, shortness of breath and wheezing.   Cardiovascular: Negative for chest pain and palpitations.  Gastrointestinal: Positive for diarrhea, heartburn and nausea. Negative for melena and vomiting.  Musculoskeletal: Negative for myalgias.       Chronic shoulder pain   Skin: Negative for rash.  Neurological: Negative for dizziness and headaches.     Observations/Objective: Pt sounded like his normal self  Not distressed Not hoarse or congested No cough or sob  Denies pain currently  Affect is normal  Cognition is normal  Assessment and Plan: Problem List Items Addressed This Visit      Other   Diarrhea    Since eating spicy food on Sunday  Some cramping  Suspect viral or food related Nausea w/o vomiting or fever  Disc imp of fluids-begin gradual fluid rehydration  Trial of zofran for nausea  Update if not starting to improve in a week or if worsening   inst to isolate himself- and watch for fever or other covid symptoms  inst to update us on Monday-consider covid testing if not improved           Follow Up Instructions: Try the zofran instead of phenergan for nausea  Sip fluids Do not try to eat until feeling better  When you do- eat small amounts of bland food like toast or crackers Watch for fever/cough/ loss of taste or smell  Watch for abdominal pain or bloody stool   If worse over the weekend-go get checked out at urgent  care or the ER If no improvement on Monday please call /alert us so I can order a covid test in town   In the meantime please continue to isolate yourself from the rest of the household    I discussed the assessment and treatment plan with the patient. The patient was provided an opportunity to ask questions and all were answered. The patient agreed with the plan and demonstrated an understanding of the instructions.   The patient was advised to call back or seek an in-person evaluation if the symptoms worsen or if the condition fails to improve  as anticipated.     Loura Pardon, MD

## 2018-10-08 DIAGNOSIS — R197 Diarrhea, unspecified: Secondary | ICD-10-CM | POA: Insufficient documentation

## 2018-10-08 NOTE — Assessment & Plan Note (Signed)
Since eating spicy food on Sunday  Some cramping  Suspect viral or food related Nausea w/o vomiting or fever  Disc imp of fluids-begin gradual fluid rehydration  Trial of zofran for nausea  Update if not starting to improve in a week or if worsening   inst to isolate himself- and watch for fever or other covid symptoms  inst to update [place] on Monday-consider covid testing if not improved

## 2018-10-13 ENCOUNTER — Ambulatory Visit: Payer: BC Managed Care – PPO | Admitting: Adult Health

## 2018-10-16 ENCOUNTER — Telehealth: Payer: Self-pay | Admitting: Adult Health

## 2018-10-16 ENCOUNTER — Other Ambulatory Visit: Payer: Self-pay

## 2018-10-16 ENCOUNTER — Ambulatory Visit: Payer: BC Managed Care – PPO | Admitting: Adult Health

## 2018-10-16 NOTE — Telephone Encounter (Signed)
Patient was scheduled for a televisit today with Tammy Parrett. Patient was called first time around 3pm, left a detailed message stating that TP was running behind but we would call back to start his televisit. Called back around 315pm and 329pm and received his VM each time.   Appointment was canceled. Patient is welcome to call back and get rescheduled.

## 2018-11-23 ENCOUNTER — Telehealth: Payer: Self-pay | Admitting: Family Medicine

## 2018-11-23 NOTE — Telephone Encounter (Signed)
Aware, thanks!

## 2018-11-23 NOTE — Telephone Encounter (Signed)
Called pt back and he is still grieving and very emotional after the loss of their unborn baby. He has an appt with Dr. Toy Care  but the earliest they can see him is Nov 2nd. (he's on cancellation list). Dr. Starleen Arms office told pt to f/u with [place] because pt's job only gives them 20 days of bereavement and he wants to be out of work until his appt with Dr. Toy Care. Pt has a doxy appt scheduled tomorrow to discuss this with Dr. Glori Bickers he just wanted Dr. Glori Bickers to be aware of what's going on and why the appt was scheduled. Once he has his appt he will get his job to fax over forms for his leave

## 2018-11-23 NOTE — Telephone Encounter (Signed)
Patient's wife is requesting our office give the patient a call  They would like to give an update before his virtual appointment tomorrow morning He is needing paperwork filled out for his work.    Patient and his wife ended up loosing their baby @ 33 weeks and they want to give and update to Dr Glori Bickers and more information about exactly what he is needing   Patient would like a call back today if possible   C/B # [phone]

## 2018-11-24 ENCOUNTER — Encounter: Payer: Self-pay | Admitting: Family Medicine

## 2018-11-24 ENCOUNTER — Ambulatory Visit (INDEPENDENT_AMBULATORY_CARE_PROVIDER_SITE_OTHER): Payer: Self-pay | Admitting: Family Medicine

## 2018-11-24 DIAGNOSIS — F4321 Adjustment disorder with depressed mood: Secondary | ICD-10-CM

## 2018-11-24 DIAGNOSIS — F4320 Adjustment disorder, unspecified: Secondary | ICD-10-CM | POA: Insufficient documentation

## 2018-11-24 MED ORDER — SERTRALINE HCL 50 MG PO TABS
50.0000 mg | ORAL_TABLET | Freq: Every day | ORAL | 3 refills | Status: DC
Start: 2018-11-24 — End: 2018-12-07

## 2018-11-24 NOTE — Progress Notes (Signed)
Virtual Visit via Video Note  I connected with Jorge Stevens on 11/24/18 at  9:00 AM EDT by a video enabled telemedicine application and verified that I am speaking with the correct person using two identifiers.  Location: Patient: home Provider: office    I discussed the limitations of evaluation and management by telemedicine and the availability of in person appointments. The patient expressed understanding and agreed to proceed.  History of Present Illness: Wife lost pregnancy at 33 weeks for very high blood sugar -DKA Delivered 9/28 (baby not viable)   He cannot use his paternity leave due to passing of the baby Told he has 20 days bereavement    Made an appt with psychiatry -Dr Delle Reining -needs to confirm actual appt after paying bill  Has a few #s of counselor from wifes gyn   He has "never been so lost in his life"  He has not felt suicidal  Feels guilty if he tries to feel joy from anything  Hopeless at times  Time is moving very fast    He makes himself eat - because people remind him  No appetite  Sleep is -fitful/on and off - tired  Anxious - planning next move  Poor concentration -cannot complete tasks   Using delivery for anything  [employer] is helping him out   Works from home -in room where the baby was supposed to be  Does not feel able right now   Patient Active Problem List   Diagnosis Date Noted  . Grief reaction 11/24/2018  . Diarrhea 10/08/2018  . Varicose veins of both lower extremities 01/17/2018  . Low back pain 01/17/2018  . Rotator cuff tear arthropathy, left 07/06/2017  . Impingement syndrome of left shoulder   . Pre-diabetes 03/23/2015  . Chronic bursitis of left shoulder 04/23/2014  . Hyperlipidemia 09/26/2012  . Hypertension, essential 12/16/2010  . Routine general medical examination at a health care facility 12/07/2010  . Anal fissure 08/13/2010  . OSA on CPAP 12/01/2008  . WISDOM TEETH EXTRACTION, HX OF 11/26/2008  . Morbid  obesity ([facility]) 07/28/2006   Past Medical History:  Diagnosis Date  . Anxiety   . Morbid obesity with BMI of 45.0-49.9, adult ([facility])   . Other alteration of consciousness   . Other specified personal history presenting hazards to health(V15.89)   . Shoulder impingement syndrome, left   . Unspecified sleep apnea    Past Surgical History:  Procedure Laterality Date  . SHOULDER ARTHROSCOPY Left 07/06/2017   Procedure: LEFT SHOULDER ARTHROSCOPY, DEBRIDEMENT, AND DECOMPRESSION;  Surgeon: Nadara Mustard, MD;  Location: [facility];  Service: Orthopedics;  Laterality: Left;  . SHOULDER ARTHROSCOPY W/ SUBACROMIAL DECOMPRESSION AND DISTAL CLAVICLE EXCISION Left    Dr. Kristeen Miss  . TONSILLECTOMY    . WISDOM TOOTH EXTRACTION     Social History   Tobacco Use  . Smoking status: Former Smoker    Packs/day: 1.50    Years: 6.00    Pack years: 9.00    Types: Cigarettes    Quit date: 03/16/2007    Years since quitting: 11.7  . Smokeless tobacco: Never Used  Substance Use Topics  . Alcohol use: Yes    Alcohol/week: 0.0 standard drinks    Comment: Occasional  . Drug use: No   Family History  Problem Relation Age of Onset  . Hyperlipidemia Father   . Hyperlipidemia Mother   . Diabetes Unknown        grandparents  . Throat cancer Unknown  grandmother  . Lung cancer Unknown        grandfather   Allergies  Allergen Reactions  . Robaxin [Methocarbamol]     Sleepy   . Ambien [Zolpidem Tartrate] Itching   Current Outpatient Medications on File Prior to Visit  Medication Sig Dispense Refill  . fluticasone (FLONASE) 50 MCG/ACT nasal spray Place 2 sprays into both nostrils daily as needed for allergies. 16 g 11  . ibuprofen (ADVIL,MOTRIN) 800 MG tablet Take 1 tablet (800 mg total) by mouth every 8 (eight) hours as needed for moderate pain (with a meal). 90 tablet 5  . metaxalone (SKELAXIN) 800 MG tablet Take 0.5-1 tablets (400-800 mg total) by mouth 3 (three) times daily as needed for muscle  spasms. 30 tablet 1  . NON FORMULARY CPAP 12 cm, full face mask     . ondansetron (ZOFRAN) 8 MG tablet Take 1 tablet (8 mg total) by mouth every 8 (eight) hours as needed for nausea or vomiting. 20 tablet 0  . Soft Lens Products (REWETTING DROPS) SOLN Apply 1 drop to eye daily as needed (for dry lenses/lubricant eyes.).    Marland Kitchen traMADol (ULTRAM) 50 MG tablet TAKE 1 TO 2 TABLETS BY MOUTH TWICE A DAY AS NEEDED FOR MODERATE TO SEVERE PAIN 100 tablet 0   No current facility-administered medications on file prior to visit.    Review of Systems  Constitutional: Positive for malaise/fatigue. Negative for chills and fever.  HENT: Negative for sore throat.   Eyes: Negative for blurred vision.  Respiratory: Negative for cough and shortness of breath.   Cardiovascular: Negative for chest pain, palpitations and leg swelling.  Gastrointestinal: Negative for nausea and vomiting.  Skin: Negative for rash.  Neurological: Negative for dizziness and headaches.  Psychiatric/Behavioral: Positive for depression. Negative for hallucinations, memory loss, substance abuse and suicidal ideas. The patient is nervous/anxious and has insomnia.        Observations/Objective: Patient appears well, in no distress Weight is baseline (obese)  No facial swelling or asymmetry Normal voice-not hoarse and no slurred speech No obvious tremor or mobility impairment Moving neck and UEs normally Able to hear the call well  No cough or shortness of breath during interview  Talkative and mentally sharp with no cognitive changes No skin changes on face or neck , no rash or pallor Affect is depressed /mildly anxious , tearful at times  Does not voice SI   Assessment and Plan:  Problem List Items Addressed This Visit      Other   Grief reaction    After loss of wife's 33 week pregnancy  Having difficulty, feeling depressed and withdrawn  Reviewed stressors/ coping techniques/symptoms/ support sources/ tx options and side  effects in detail today Has appt with psychiatry (Dr Lajoyce Corners) in early nov  Will extend disability until then (will rec paperwork)  Disc coping skills  Plans to call for appt with recommended counselor  Px zoloft 50 mg to start with 25 mg daily for a week and then inc to 50  Discussed expectations of SSRI medication including time to effectiveness and mechanism of action, also poss of side effects (early and late)- including mental fuzziness, weight or appetite change, nausea and poss of worse dep or anxiety (even suicidal thoughts)  Pt voiced understanding and will stop med and update if this occurs   >25 minutes spent in face to face time with patient, >50% spent in counselling or coordination of care-including need for time to grieve with self care  and care of family , value of keeping journal to help while waiting for counseling appt  inst to update if side eff/problems or if depression worsens inst to go to [place] if suicidal thoughts            Follow Up Instructions:  Get help from friends and family when you can for house issues/errands etc  Let yourself grieve  Write in a journal since you do not feel like talking to people  Start zoloft (sertraline) 1/2 pill each evening for 1 week and then increase to 1 pill daily  If any intolerable side effects or if you feel worse please let me know (and stop it)   Call us when you know the date of your psychiatry visit Get started with counseling as soon as you can   It sounds like you will need short term disability from 9/13 to about 11/3 or so -I will work on paperwork when it arrives    I discussed the assessment and treatment plan with the patient. The patient was provided an opportunity to ask questions and all were answered. The patient agreed with the plan and demonstrated an understanding of the instructions.   The patient was advised to call back or seek an in-person evaluation if the symptoms worsen or if the condition fails to  improve as anticipated.     Roxy MannsMarne Gisella Alwine, MD

## 2018-11-24 NOTE — Patient Instructions (Signed)
  Get help from friends and family when you can for house issues/errands etc  Let yourself grieve  Write in a journal since you do not feel like talking to people  Start zoloft (sertraline) 1/2 pill each evening for 1 week and then increase to 1 pill daily  If any intolerable side effects or if you feel worse please let me know (and stop it)   Call us when you know the date of your psychiatry visit Get started with counseling as soon as you can   It sounds like you will need short term disability from 9/13 to about 11/3 or so -I will work on paperwork when it arrives

## 2018-11-25 ENCOUNTER — Encounter: Payer: Self-pay | Admitting: Family Medicine

## 2018-11-25 NOTE — Assessment & Plan Note (Signed)
After loss of wife's 33 week pregnancy  Having difficulty, feeling depressed and withdrawn  Reviewed stressors/ coping techniques/symptoms/ support sources/ tx options and side effects in detail today Has appt with psychiatry (Dr Lajoyce Corners) in early nov  Will extend disability until then (will rec paperwork)  Disc coping skills  Plans to call for appt with recommended counselor  Px zoloft 50 mg to start with 25 mg daily for a week and then inc to 50  Discussed expectations of SSRI medication including time to effectiveness and mechanism of action, also poss of side effects (early and late)- including mental fuzziness, weight or appetite change, nausea and poss of worse dep or anxiety (even suicidal thoughts)  Pt voiced understanding and will stop med and update if this occurs   >25 minutes spent in face to face time with patient, >50% spent in counselling or coordination of care-including need for time to grieve with self care and care of family , value of keeping journal to help while waiting for counseling appt  inst to update if side eff/problems or if depression worsens inst to go to [place] if suicidal thoughts

## 2018-11-28 ENCOUNTER — Encounter: Payer: Self-pay | Admitting: Family Medicine

## 2018-11-29 ENCOUNTER — Encounter: Payer: Self-pay | Admitting: Family Medicine

## 2018-12-06 ENCOUNTER — Other Ambulatory Visit: Payer: Self-pay | Admitting: Family Medicine

## 2018-12-07 NOTE — Telephone Encounter (Signed)
Refilled request asking to change Rx to a 90 day supply. Last filled on 11/24/18 #30 tabs with 3 refills, CPE scheduled 05/09/19

## 2018-12-08 ENCOUNTER — Telehealth: Payer: Self-pay | Admitting: Family Medicine

## 2018-12-08 NOTE — Telephone Encounter (Signed)
[employer] faxed behavior health form to be filled out  In dr tower in box for review and signature   Also per my chart  The previous paperwork from [employer] needed return to work date. Pt wanted 03/01/2019.  Both forms are in blue folder

## 2018-12-10 NOTE — Telephone Encounter (Signed)
Done and in IN box 

## 2018-12-11 NOTE — Telephone Encounter (Signed)
Paperwork faxed °

## 2018-12-14 NOTE — Telephone Encounter (Signed)
Tried calling pt no voicemail  Let pt know paperwork faxed 9/28 There is a copy here for pt  Copy for pt  Copy for scan

## 2019-01-01 DIAGNOSIS — F332 Major depressive disorder, recurrent severe without psychotic features: Secondary | ICD-10-CM | POA: Diagnosis not present

## 2019-01-01 DIAGNOSIS — F410 Panic disorder [episodic paroxysmal anxiety] without agoraphobia: Secondary | ICD-10-CM | POA: Diagnosis not present

## 2019-01-30 DIAGNOSIS — F4321 Adjustment disorder with depressed mood: Secondary | ICD-10-CM | POA: Diagnosis not present

## 2019-01-30 DIAGNOSIS — F331 Major depressive disorder, recurrent, moderate: Secondary | ICD-10-CM | POA: Diagnosis not present

## 2019-01-30 DIAGNOSIS — G4709 Other insomnia: Secondary | ICD-10-CM | POA: Diagnosis not present

## 2019-02-26 DIAGNOSIS — F332 Major depressive disorder, recurrent severe without psychotic features: Secondary | ICD-10-CM | POA: Diagnosis not present

## 2019-02-26 DIAGNOSIS — F331 Major depressive disorder, recurrent, moderate: Secondary | ICD-10-CM | POA: Diagnosis not present

## 2019-03-21 IMAGING — DX DG LUMBAR SPINE COMPLETE 4+V
5 series · 5 of 5 positions shown · non-contrast
Comparison: None.

CLINICAL DATA: Low back pain

EXAM:
LUMBAR SPINE - COMPLETE 4+ VIEW

[l-spine ap]
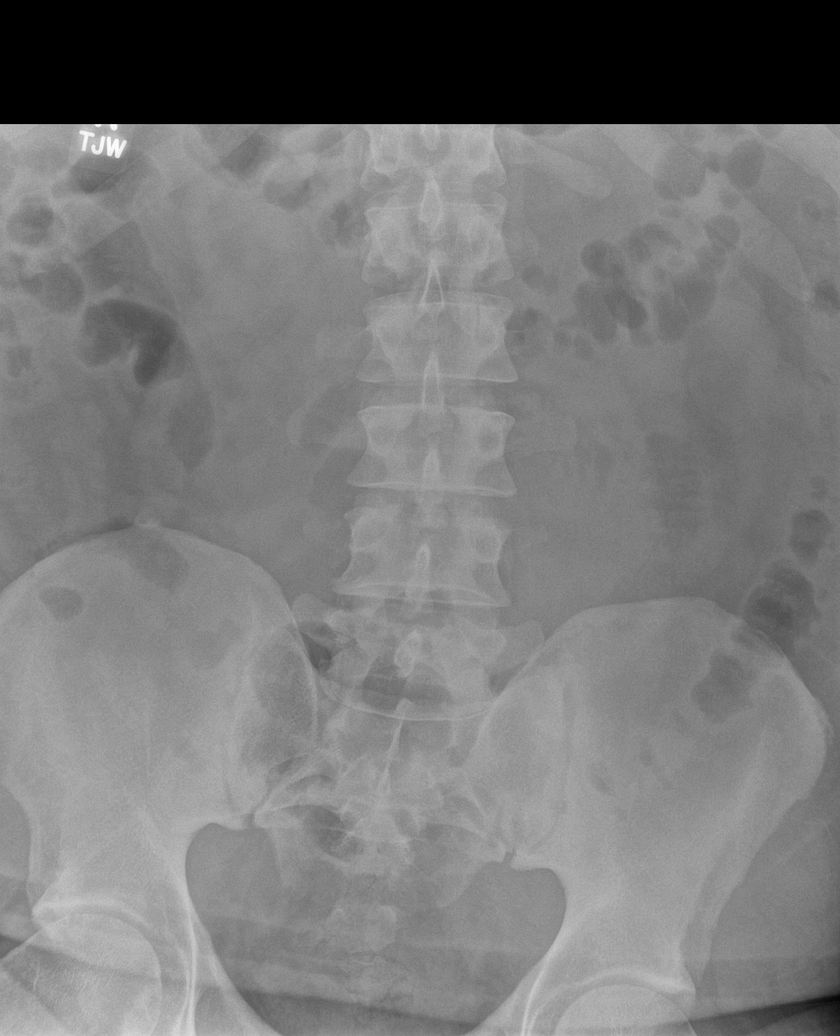

[l-spine obl (1 of 2)]
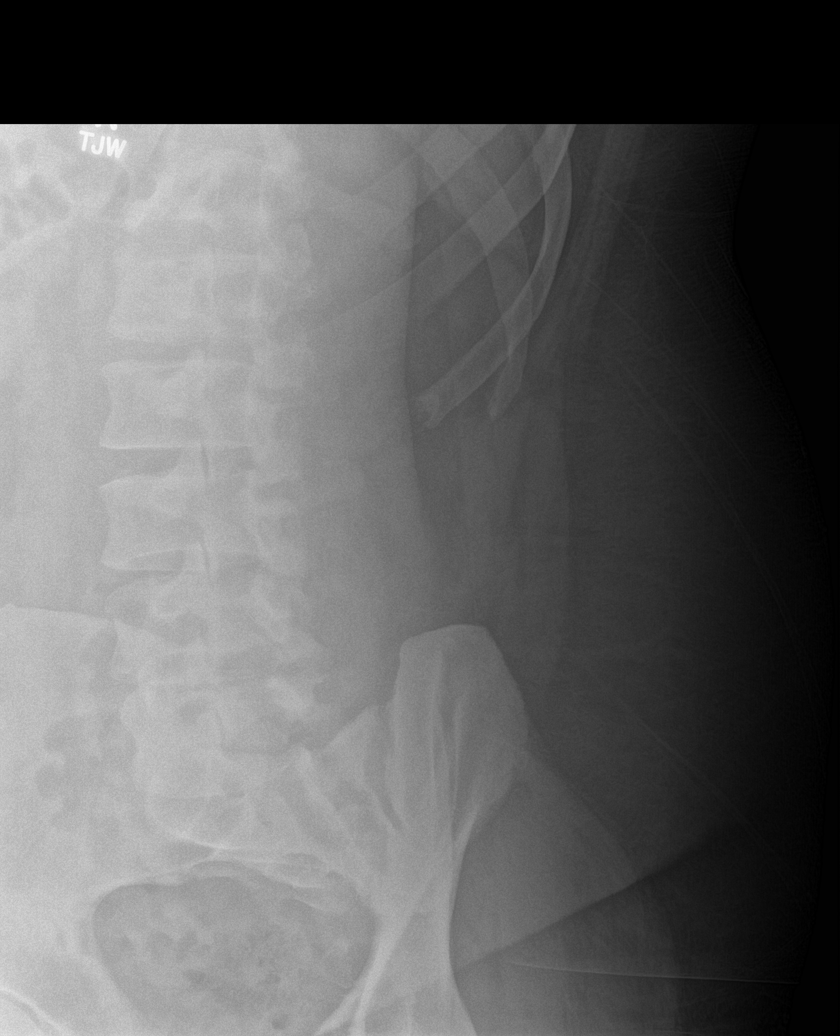

[l-spine obl (2 of 2)]
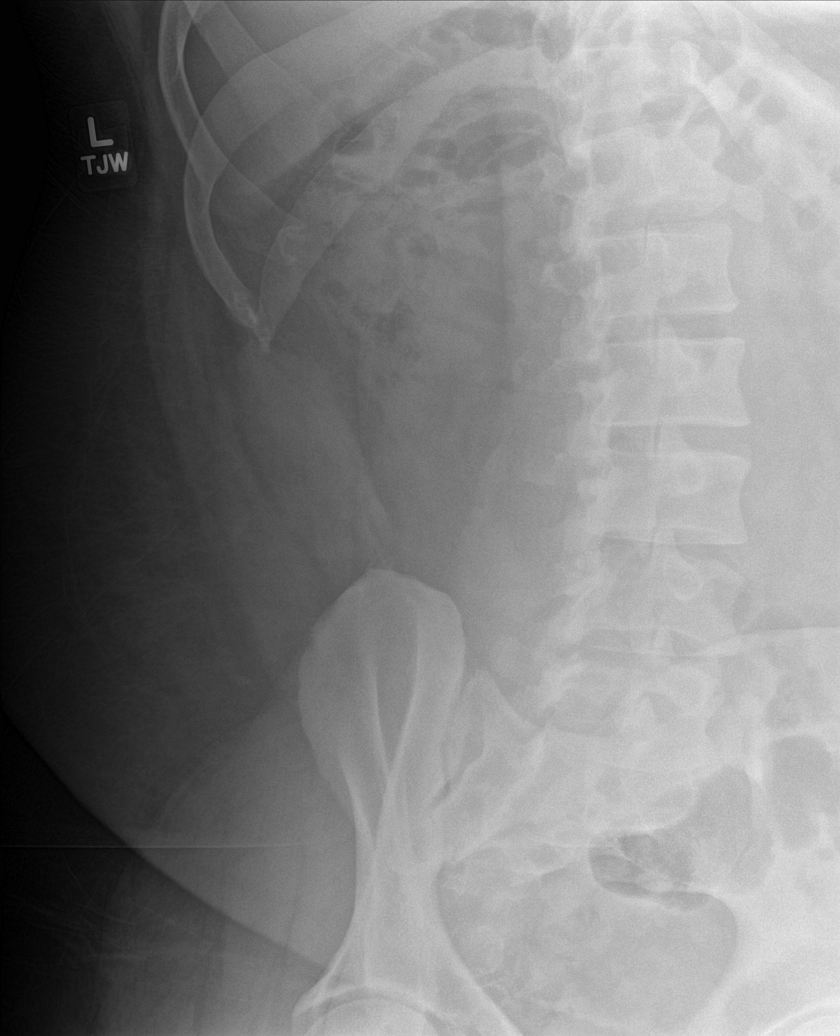

[l-spine lat]
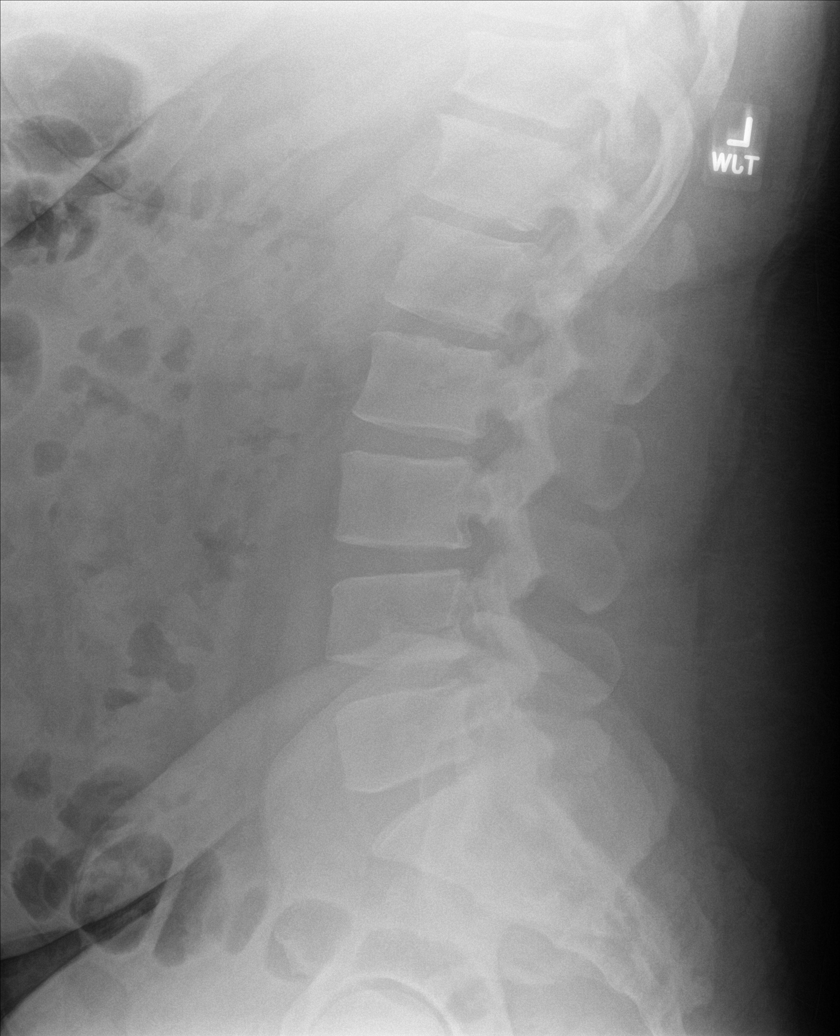

[l-spine l5/s1]
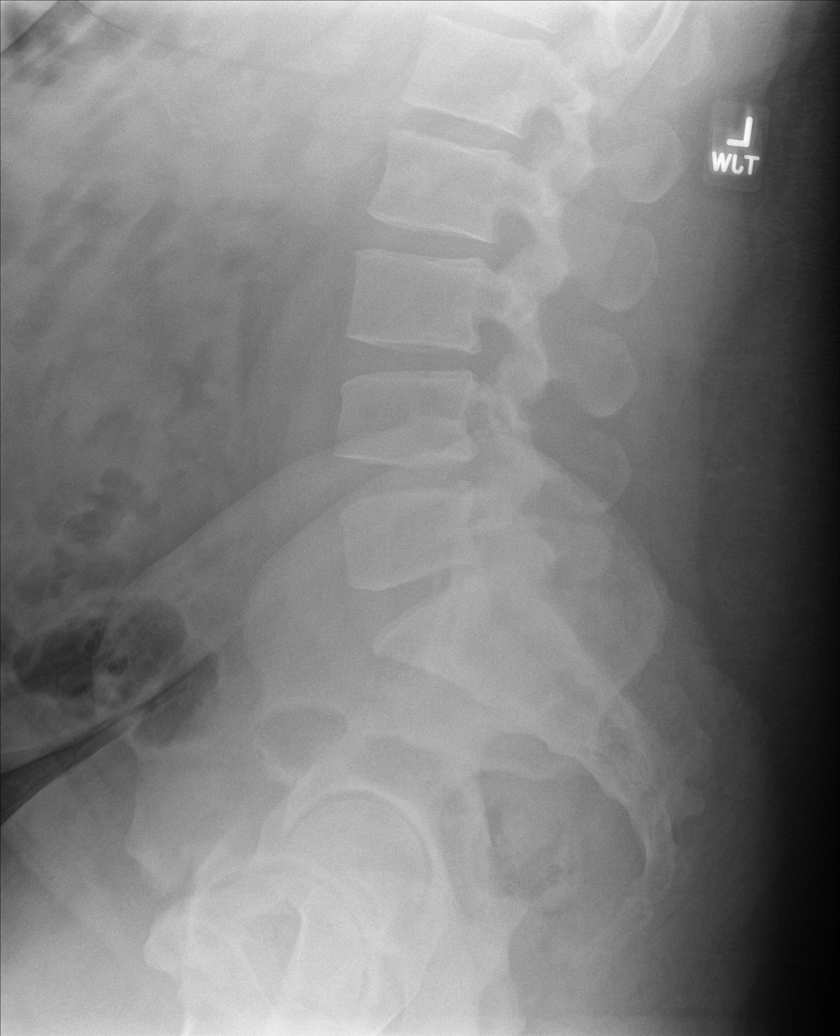

[5 of 5 positions shown; findings below may reference images not displayed]

FINDINGS: There is no evidence of lumbar spine fracture. Alignment is normal.
Intervertebral disc spaces are maintained.
IMPRESSION: Negative.

## 2019-04-03 DIAGNOSIS — F331 Major depressive disorder, recurrent, moderate: Secondary | ICD-10-CM | POA: Diagnosis not present

## 2019-04-06 ENCOUNTER — Ambulatory Visit (INDEPENDENT_AMBULATORY_CARE_PROVIDER_SITE_OTHER): Payer: Self-pay | Admitting: Primary Care

## 2019-04-06 ENCOUNTER — Encounter: Payer: Self-pay | Admitting: Primary Care

## 2019-04-06 ENCOUNTER — Other Ambulatory Visit: Payer: Self-pay

## 2019-04-06 DIAGNOSIS — G4733 Obstructive sleep apnea (adult) (pediatric): Secondary | ICD-10-CM

## 2019-04-06 NOTE — Progress Notes (Signed)
Virtual Visit via Telephone Note  I connected with Jorge Stevens on 04/06/19 at 10:30 AM EST by telephone and verified that I am speaking with the correct person using two identifiers.  Location: Patient: Home Provider: Office   I discussed the limitations, risks, security and privacy concerns of performing an evaluation and management service by telephone and the availability of in person appointments. I also discussed with the patient that there may be a patient responsible charge related to this service. The patient expressed understanding and agreed to proceed.   History of Present Illness: 34 year old male, former smoker. PMH significant for OSA on CPAP, hypertension, hyperlipidemia, pre-diabetes. Patient of Dr. Vassie Loll, last seen by pulmonary NP on 06/23/17. Maintained on CPAP 15cm h20.    04/06/2019 Patient contacted for televisit/1 year follow-up for OSA. He is doing well, no acute complaints. Compliant with CPAP use. No issues with pressure setting or mask fit. Wears full face mask. His wife has noted him snoring at times, reports that his CPAP has has issues ramping up to current pressure setting. States that he needs to go in manually to ensure pressure is set correctly. He needs supplies renewed, would like to use a different DME company than [facility] [employer]. No significant daytime fatigue.   [facility] download: - Usage 89/90 days;  92% > 4 hours - Average time used 6 hours - Pressure 15cm H20 - Minimal air leaks (0.2L.min- 95%) - AHI 2.5   Observations/Objective:  - No shortness of breath, wheezing or cough noted during phone conversation  Significant tests/ events PSG >>mod obstructive sleep apnea >>started CPAP 10 cm, medium quattro mask, goal 12 cm   Assessment and Plan:  OSA: - Compliant with CPAP and reports benefit from use - Pressure 15cm h20; AHI 2.5 - No changes to current setting  - CPAP machine >5 yeas old, pressure not working properly  - Referral  establish new DME company, new CPAP machine and renew supplies  Follow Up Instructions:   - FU 1 year with Dr. Vassie Loll or NP  I discussed the assessment and treatment plan with the patient. The patient was provided an opportunity to ask questions and all were answered. The patient agreed with the plan and demonstrated an understanding of the instructions.   The patient was advised to call back or seek an in-person evaluation if the symptoms worsen or if the condition fails to improve as anticipated.  I provided 18 minutes of non-face-to-face time during this encounter.   Glenford Bayley, NP

## 2019-04-06 NOTE — Patient Instructions (Addendum)
Recommendations: Continue CPAP every night, aim to wear 4-6 hours or more  Do not drive if experiencing excessive daytime fatigue or somnolence  Continue to work on weight loss  Orders: Change DME ([facility]) New machine and renew CPAP supplies  Follow-up: 1 year with Dr. Elsworth Soho    Sleep Apnea Sleep apnea affects breathing during sleep. It causes breathing to stop for a short time or to become shallow. It can also increase the risk of:  Heart attack.  Stroke.  Being very overweight (obese).  Diabetes.  Heart failure.  Irregular heartbeat. The goal of treatment is to help you breathe normally again. What are the causes? There are three kinds of sleep apnea:  Obstructive sleep apnea. This is caused by a blocked or collapsed airway.  Central sleep apnea. This happens when the brain does not send the right signals to the muscles that control breathing.  Mixed sleep apnea. This is a combination of obstructive and central sleep apnea. The most common cause of this condition is a collapsed or blocked airway. This can happen if:  Your throat muscles are too relaxed.  Your tongue and tonsils are too large.  You are overweight.  Your airway is too small. What increases the risk?  Being overweight.  Smoking.  Having a small airway.  Being older.  Being male.  Drinking alcohol.  Taking medicines to calm yourself (sedatives or tranquilizers).  Having family members with the condition. What are the signs or symptoms?  Trouble staying asleep.  Being sleepy or tired during the day.  Getting angry a lot.  Loud snoring.  Headaches in the morning.  Not being able to focus your mind (concentrate).  Forgetting things.  Less interest in sex.  Mood swings.  Personality changes.  Feelings of sadness (depression).  Waking up a lot during the night to pee (urinate).  Dry mouth.  Sore throat. How is this  diagnosed?  Your medical history.  A physical exam.  A test that is done when you are sleeping (sleep study). The test is most often done in a sleep lab but may also be done at home. How is this treated?   Sleeping on your side.  Using a medicine to get rid of mucus in your nose (decongestant).  Avoiding the use of alcohol, medicines to help you relax, or certain pain medicines (narcotics).  Losing weight, if needed.  Changing your diet.  Not smoking.  Using a machine to open your airway while you sleep, such as: ? An oral appliance. This is a mouthpiece that shifts your lower jaw forward. ? A CPAP device. This device blows air through a mask when you breathe out (exhale). ? An EPAP device. This has valves that you put in each nostril. ? A BPAP device. This device blows air through a mask when you breathe in (inhale) and breathe out.  Having surgery if other treatments do not work. It is important to get treatment for sleep apnea. Without treatment, it can lead to:  High blood pressure.  Coronary artery disease.  In men, not being able to have an erection (impotence).  Reduced thinking ability. Follow these instructions at home: Lifestyle  Make changes that your doctor recommends.  Eat a healthy diet.  Lose weight if needed.  Avoid alcohol, medicines to help you relax, and some pain medicines.  Do not use any products that contain nicotine or tobacco, such as cigarettes, e-cigarettes, and chewing tobacco. If  you need help quitting, ask your doctor. General instructions  Take over-the-counter and prescription medicines only as told by your doctor.  If you were given a machine to use while you sleep, use it only as told by your doctor.  If you are having surgery, make sure to tell your doctor you have sleep apnea. You may need to bring your device with you.  Keep all follow-up visits as told by your doctor. This is important. Contact a doctor if:  The  machine that you were given to use during sleep bothers you or does not seem to be working.  You do not get better.  You get worse. Get help right away if:  Your chest hurts.  You have trouble breathing in enough air.  You have an uncomfortable feeling in your back, arms, or stomach.  You have trouble talking.  One side of your body feels weak.  A part of your face is hanging down. These symptoms may be an emergency. Do not wait to see if the symptoms will go away. Get medical help right away. Call your local emergency services (911 in the [place] Do not drive yourself to the hospital. Summary  This condition affects breathing during sleep.  The most common cause is a collapsed or blocked airway.  The goal of treatment is to help you breathe normally while you sleep. This information is not intended to replace advice given to you by your health care provider. Make sure you discuss any questions you have with your health care provider. Document Revised: 12/16/2017 Document Reviewed: 10/25/2017 Elsevier Patient Education  2020 Elsevier Inc.   CPAP and BPAP Information CPAP and BPAP are methods of helping a person breathe with the use of air pressure. CPAP stands for "continuous positive airway pressure." BPAP stands for "bi-level positive airway pressure." In both methods, air is blown through your nose or mouth and into your air passages to help you breathe well. CPAP and BPAP use different amounts of pressure to blow air. With CPAP, the amount of pressure stays the same while you breathe in and out. With BPAP, the amount of pressure is increased when you breathe in (inhale) so that you can take larger breaths. Your health care provider will recommend whether CPAP or BPAP would be more helpful for you. Why are CPAP and BPAP treatments used? CPAP or BPAP can be helpful if you have:  Sleep apnea.  Chronic obstructive pulmonary disease (COPD).  Heart failure.  Medical  conditions that weaken the muscles of the chest including muscular dystrophy, or neurological diseases such as amyotrophic lateral sclerosis (ALS).  Other problems that cause breathing to be weak, abnormal, or difficult. CPAP is most commonly used for obstructive sleep apnea (OSA) to keep the airways from collapsing when the muscles relax during sleep. How is CPAP or BPAP administered? Both CPAP and BPAP are provided by a small machine with a flexible plastic tube that attaches to a plastic mask. You wear the mask. Air is blown through the mask into your nose or mouth. The amount of pressure that is used to blow the air can be adjusted on the machine. Your health care provider will determine the pressure setting that should be used based on your individual needs. When should CPAP or BPAP be used? In most cases, the mask only needs to be worn during sleep. Generally, the mask needs to be worn throughout the night and during any daytime naps. People with certain medical conditions may also  need to wear the mask at other times when they are awake. Follow instructions from your health care provider about when to use the machine. What are some tips for using the mask?   Because the mask needs to be snug, some people feel trapped or closed-in (claustrophobic) when first using the mask. If you feel this way, you may need to get used to the mask. One way to do this is by holding the mask loosely over your nose or mouth and then gradually applying the mask more snugly. You can also gradually increase the amount of time that you use the mask.  Masks are available in various types and sizes. Some fit over your mouth and nose while others fit over just your nose. If your mask does not fit well, talk with your health care provider about getting a different one.  If you are using a mask that fits over your nose and you tend to breathe through your mouth, a chin strap may be applied to help keep your mouth  closed.  The CPAP and BPAP machines have alarms that may sound if the mask comes off or develops a leak.  If you have trouble with the mask, it is very important that you talk with your health care provider about finding a way to make the mask easier to tolerate. Do not stop using the mask. Stopping the use of the mask could have a negative impact on your health. What are some tips for using the machine?  Place your CPAP or BPAP machine on a secure table or stand near an electrical outlet.  Know where the on/off switch is located on the machine.  Follow instructions from your health care provider about how to set the pressure on your machine and when you should use it.  Do not eat or drink while the CPAP or BPAP machine is on. Food or fluids could get pushed into your lungs by the pressure of the CPAP or BPAP.  Do not smoke. Tobacco smoke residue can damage the machine.  For home use, CPAP and BPAP machines can be rented or purchased through home health care companies. Many different brands of machines are available. Renting a machine before purchasing may help you find out which particular machine works well for you.  Keep the CPAP or BPAP machine and attachments clean. Ask your health care provider for specific instructions. Get help right away if:  You have redness or open areas around your nose or mouth where the mask fits.  You have trouble using the CPAP or BPAP machine.  You cannot tolerate wearing the CPAP or BPAP mask.  You have pain, discomfort, and bloating in your abdomen. Summary  CPAP and BPAP are methods of helping a person breathe with the use of air pressure.  Both CPAP and BPAP are provided by a small machine with a flexible plastic tube that attaches to a plastic mask.  If you have trouble with the mask, it is very important that you talk with your health care provider about finding a way to make the mask easier to tolerate. This information is not intended to  replace advice given to you by your health care provider. Make sure you discuss any questions you have with your health care provider. Document Revised: 06/21/2018 Document Reviewed: 01/19/2016 Elsevier Patient Education  2020 [employer].

## 2019-04-23 DIAGNOSIS — F331 Major depressive disorder, recurrent, moderate: Secondary | ICD-10-CM | POA: Diagnosis not present

## 2019-05-06 ENCOUNTER — Telehealth: Payer: Self-pay | Admitting: Family Medicine

## 2019-05-06 ENCOUNTER — Encounter: Payer: Self-pay | Admitting: Family Medicine

## 2019-05-06 DIAGNOSIS — E7800 Pure hypercholesterolemia, unspecified: Secondary | ICD-10-CM

## 2019-05-06 DIAGNOSIS — R7303 Prediabetes: Secondary | ICD-10-CM

## 2019-05-06 DIAGNOSIS — I10 Essential (primary) hypertension: Secondary | ICD-10-CM

## 2019-05-06 NOTE — Telephone Encounter (Signed)
-----   Message from Alvina Chou sent at 04/25/2019  9:35 AM EST ----- Regarding: Lab orders for Monday, 2.22.21 Patient is scheduled for CPX labs, please order future labs, Thanks , Camelia Eng

## 2019-05-07 ENCOUNTER — Other Ambulatory Visit: Payer: BC Managed Care – PPO

## 2019-05-07 ENCOUNTER — Other Ambulatory Visit: Payer: Self-pay

## 2019-05-09 ENCOUNTER — Encounter: Payer: BLUE CROSS/BLUE SHIELD | Admitting: Family Medicine

## 2019-05-15 DIAGNOSIS — F3341 Major depressive disorder, recurrent, in partial remission: Secondary | ICD-10-CM | POA: Diagnosis not present

## 2019-05-15 DIAGNOSIS — F4321 Adjustment disorder with depressed mood: Secondary | ICD-10-CM | POA: Diagnosis not present

## 2019-05-17 ENCOUNTER — Other Ambulatory Visit: Payer: BC Managed Care – PPO

## 2019-05-21 ENCOUNTER — Encounter: Payer: Self-pay | Admitting: Family Medicine

## 2019-07-09 DIAGNOSIS — G4733 Obstructive sleep apnea (adult) (pediatric): Secondary | ICD-10-CM | POA: Diagnosis not present

## 2019-07-17 ENCOUNTER — Telehealth: Payer: Self-pay | Admitting: Family Medicine

## 2019-07-17 DIAGNOSIS — Z0279 Encounter for issue of other medical certificate: Secondary | ICD-10-CM

## 2019-07-17 NOTE — Telephone Encounter (Signed)
Pt's wife completed form and asked Dr. Milinda Antis review it, sign it and we fax it back to fax # on chart. Form in Dr. Royden Purl inbox

## 2019-07-17 NOTE — Telephone Encounter (Signed)
Pt's wife dropped off form to be completed. Gave paperwork to [place].

## 2019-07-17 NOTE — Telephone Encounter (Signed)
Done and in IN box Needs to be scanned for chart also Thanks

## 2019-07-18 NOTE — Telephone Encounter (Signed)
Form faxed and a Copy sent to scanning. Sent pt mychart message letting him know and asked how he wants to receive his copy

## 2019-08-08 DIAGNOSIS — G4733 Obstructive sleep apnea (adult) (pediatric): Secondary | ICD-10-CM | POA: Diagnosis not present

## 2019-09-08 DIAGNOSIS — G4733 Obstructive sleep apnea (adult) (pediatric): Secondary | ICD-10-CM | POA: Diagnosis not present

## 2019-10-08 DIAGNOSIS — G4733 Obstructive sleep apnea (adult) (pediatric): Secondary | ICD-10-CM | POA: Diagnosis not present

## 2019-10-26 DIAGNOSIS — F331 Major depressive disorder, recurrent, moderate: Secondary | ICD-10-CM | POA: Diagnosis not present

## 2019-10-26 DIAGNOSIS — F332 Major depressive disorder, recurrent severe without psychotic features: Secondary | ICD-10-CM | POA: Diagnosis not present

## 2019-10-26 DIAGNOSIS — F410 Panic disorder [episodic paroxysmal anxiety] without agoraphobia: Secondary | ICD-10-CM | POA: Diagnosis not present

## 2019-10-26 DIAGNOSIS — G4700 Insomnia, unspecified: Secondary | ICD-10-CM | POA: Diagnosis not present

## 2019-11-08 DIAGNOSIS — G4733 Obstructive sleep apnea (adult) (pediatric): Secondary | ICD-10-CM | POA: Diagnosis not present

## 2019-11-23 DIAGNOSIS — F4321 Adjustment disorder with depressed mood: Secondary | ICD-10-CM | POA: Diagnosis not present

## 2019-11-23 DIAGNOSIS — F332 Major depressive disorder, recurrent severe without psychotic features: Secondary | ICD-10-CM | POA: Diagnosis not present

## 2019-11-23 DIAGNOSIS — F331 Major depressive disorder, recurrent, moderate: Secondary | ICD-10-CM | POA: Diagnosis not present

## 2019-12-09 DIAGNOSIS — G4733 Obstructive sleep apnea (adult) (pediatric): Secondary | ICD-10-CM | POA: Diagnosis not present

## 2019-12-13 ENCOUNTER — Ambulatory Visit: Payer: Self-pay

## 2019-12-13 ENCOUNTER — Ambulatory Visit (INDEPENDENT_AMBULATORY_CARE_PROVIDER_SITE_OTHER): Payer: BC Managed Care – PPO | Admitting: Orthopedic Surgery

## 2019-12-13 ENCOUNTER — Encounter: Payer: Self-pay | Admitting: Orthopedic Surgery

## 2019-12-13 DIAGNOSIS — M25512 Pain in left shoulder: Secondary | ICD-10-CM

## 2019-12-13 DIAGNOSIS — G8929 Other chronic pain: Secondary | ICD-10-CM | POA: Diagnosis not present

## 2019-12-13 MED ORDER — OXYCODONE-ACETAMINOPHEN 5-325 MG PO TABS: 1.0000 | ORAL_TABLET | Freq: Four times a day (QID) | ORAL | 0 refills | Status: DC | PRN

## 2019-12-14 ENCOUNTER — Telehealth: Payer: Self-pay

## 2019-12-14 ENCOUNTER — Other Ambulatory Visit: Payer: Self-pay | Admitting: Orthopedic Surgery

## 2019-12-14 MED ORDER — IBUPROFEN 800 MG PO TABS: 800.0000 mg | ORAL_TABLET | Freq: Three times a day (TID) | ORAL | 1 refills | Status: DC | PRN

## 2019-12-14 NOTE — Telephone Encounter (Signed)
Patient Would like for you to send in Ibuprofen 800 mg  To his pharm-[facility]   CB [phone]

## 2019-12-14 NOTE — Telephone Encounter (Signed)
Patient aware.

## 2019-12-14 NOTE — Telephone Encounter (Signed)
Rx sent 

## 2019-12-17 ENCOUNTER — Encounter: Payer: Self-pay | Admitting: Orthopedic Surgery

## 2019-12-17 DIAGNOSIS — M25512 Pain in left shoulder: Secondary | ICD-10-CM

## 2019-12-17 DIAGNOSIS — G8929 Other chronic pain: Secondary | ICD-10-CM | POA: Diagnosis not present

## 2019-12-17 MED ORDER — LIDOCAINE HCL 1 % IJ SOLN
5.0000 mL | INTRAMUSCULAR | Status: AC | PRN
  Administered 2019-12-17: 5 mL

## 2019-12-17 MED ORDER — METHYLPREDNISOLONE ACETATE 40 MG/ML IJ SUSP
40.0000 mg | INTRAMUSCULAR | Status: AC | PRN
  Administered 2019-12-17: 40 mg via INTRA_ARTICULAR

## 2019-12-17 NOTE — Progress Notes (Signed)
Office Visit Note   Patient: Jorge Stevens           Date of Birth: 10-06-1985           MRN: [id number] Visit Date: 12/13/2019              Requested by: Tower, Audrie Gallus, MD [address] [place],  [place] [address] PCP: Judy Pimple, MD  Chief Complaint  Patient presents with  . Left Shoulder - Pain      HPI: Patient is a 34 year old gentleman who presents in follow-up with recurrent pain in the left shoulder he states his pain is now an 8 out of 10 and is a constant complains of burning tingling has used ibuprofen and Tylenol without relief.  He states he feels like his shoulder is unstable his last injection in the shoulder was about a year ago.  He has had an MRI scan in 2019 which showed a good decompression and no rotator cuff tears.  Assessment & Plan: Visit Diagnoses:  1. Chronic left shoulder pain     Plan: Patient underwent a subacromial injection will reevaluate in 3 weeks.  Follow-Up Instructions: Return in about 3 weeks (around 01/03/2020).   Ortho Exam  Patient is alert, oriented, no adenopathy, well-dressed, normal affect, normal respiratory effort. Examination patient is tender to palpation over the biceps tendon the rotator cuff is generally tender to palpation he is tender to palpation over the distal clavicle.  He has pain with Neer and Hawkins impingement test pain with drop arm test.  The joint does not sublux with range of motion.  Imaging: XR Shoulder Left  Result Date: 12/17/2019 2 view radiographs of the left shoulder shows decreased subacromial joint space previous distal clavicle resection with a congruent glenohumeral joint.  No images are attached to the encounter.  Labs: Lab Results  Component Value Date   HGBA1C 5.7 05/02/2018   HGBA1C 5.7 04/06/2016   HGBA1C 5.7 03/27/2015     Lab Results  Component Value Date   ALBUMIN 4.4 05/02/2018   ALBUMIN 3.9 04/06/2016   ALBUMIN 4.4 03/27/2015    No results found for:  MG No results found for: VD25OH  No results found for: PREALBUMIN CBC EXTENDED Latest Ref Rng & Units 05/02/2018 06/29/2017 04/06/2016  WBC 4.0 - 10.5 K/uL 5.8 6.4 5.9  RBC 4.22 - 5.81 Mil/uL 5.29 5.31 5.02  HGB 13.0 - 17.0 g/dL 76.1 60.7 37.1  HCT 39 - 52 % 45.3 46.0 41.7  PLT 150 - 400 K/uL 470.0(H) 401(H) 438.0(H)  NEUTROABS 1.4 - 7.7 K/uL 3.2 - 2.7  LYMPHSABS 0.7 - 4.0 K/uL 2.0 - 2.5     There is no height or weight on file to calculate BMI.  Orders:  Orders Placed This Encounter  Procedures  . XR Shoulder Left   Meds ordered this encounter  Medications  . oxyCODONE-acetaminophen (PERCOCET/ROXICET) 5-325 MG tablet    Sig: Take 1 tablet by mouth every 6 (six) hours as needed for severe pain.    Dispense:  20 tablet    Refill:  0     Procedures: Large Joint Inj: L subacromial bursa on 12/17/2019 12:42 PM Indications: diagnostic evaluation and pain Details: 22 G 1.5 in needle, posterior approach  Arthrogram: No  Medications: 5 mL lidocaine 1 %; 40 mg methylPREDNISolone acetate 40 MG/ML Outcome: tolerated well, no immediate complications Procedure, treatment alternatives, risks and benefits explained, specific risks discussed. Consent was given by the patient. Immediately  prior to procedure a time out was called to verify the correct patient, procedure, equipment, support staff and site/side marked as required. Patient was prepped and draped in the usual sterile fashion.      Clinical Data: No additional findings.  ROS:  All other systems negative, except as noted in the HPI. Review of Systems  Objective: Vital Signs: There were no vitals taken for this visit.  Specialty Comments:  No specialty comments available.  PMFS History: Patient Active Problem List   Diagnosis Date Noted  . Grief reaction 11/24/2018  . Diarrhea 10/08/2018  . Varicose veins of both lower extremities 01/17/2018  . Low back pain 01/17/2018  . Rotator cuff tear arthropathy, left  07/06/2017  . Impingement syndrome of left shoulder   . Pre-diabetes 03/23/2015  . Chronic bursitis of left shoulder 04/23/2014  . Hyperlipidemia 09/26/2012  . Hypertension, essential 12/16/2010  . Routine general medical examination at a health care facility 12/07/2010  . Anal fissure 08/13/2010  . OSA on CPAP 12/01/2008  . WISDOM TEETH EXTRACTION, HX OF 11/26/2008  . Morbid obesity (HCC) 07/28/2006   Past Medical History:  Diagnosis Date  . Anxiety   . Morbid obesity with BMI of 45.0-49.9, adult (HCC)   . Other alteration of consciousness   . Other specified personal history presenting hazards to health(V15.89)   . Shoulder impingement syndrome, left   . Unspecified sleep apnea     Family History  Problem Relation Age of Onset  . Hyperlipidemia Father   . Hyperlipidemia Mother   . Diabetes Other        grandparents  . Throat cancer Other        grandmother  . Lung cancer Other        grandfather    Past Surgical History:  Procedure Laterality Date  . SHOULDER ARTHROSCOPY Left 07/06/2017   Procedure: LEFT SHOULDER ARTHROSCOPY, DEBRIDEMENT, AND DECOMPRESSION;  Surgeon: Nadara Mustard, MD;  Location: [facility] OR;  Service: Orthopedics;  Laterality: Left;  . SHOULDER ARTHROSCOPY W/ SUBACROMIAL DECOMPRESSION AND DISTAL CLAVICLE EXCISION Left    Dr. Kristeen Miss  . TONSILLECTOMY    . WISDOM TOOTH EXTRACTION     Social History   Occupational History  . Occupation: [employer]  Tobacco Use  . Smoking status: Former Smoker    Packs/day: 1.50    Years: 6.00    Pack years: 9.00    Types: Cigarettes    Quit date: 03/16/2007    Years since quitting: 12.7  . Smokeless tobacco: Never Used  Vaping Use  . Vaping Use: Former  Substance and Sexual Activity  . Alcohol use: Yes    Alcohol/week: 0.0 standard drinks    Comment: Occasional  . Drug use: No  . Sexual activity: Not on file

## 2019-12-27 DIAGNOSIS — F332 Major depressive disorder, recurrent severe without psychotic features: Secondary | ICD-10-CM | POA: Diagnosis not present

## 2019-12-31 DIAGNOSIS — F4322 Adjustment disorder with anxiety: Secondary | ICD-10-CM | POA: Diagnosis not present

## 2020-01-01 ENCOUNTER — Telehealth: Payer: Self-pay | Admitting: Family Medicine

## 2020-01-01 DIAGNOSIS — E7800 Pure hypercholesterolemia, unspecified: Secondary | ICD-10-CM

## 2020-01-01 DIAGNOSIS — I10 Essential (primary) hypertension: Secondary | ICD-10-CM

## 2020-01-01 DIAGNOSIS — R7303 Prediabetes: Secondary | ICD-10-CM

## 2020-01-01 NOTE — Telephone Encounter (Signed)
I received a note from Dr Delle Reining req pt's last labs He is due and it has been a while  Please schedule fasting labs -they are ordered

## 2020-01-02 NOTE — Telephone Encounter (Signed)
I spoke to patient and he said he'll call back to schedule fasting lab.

## 2020-01-03 ENCOUNTER — Encounter: Payer: Self-pay | Admitting: Orthopedic Surgery

## 2020-01-03 ENCOUNTER — Ambulatory Visit (INDEPENDENT_AMBULATORY_CARE_PROVIDER_SITE_OTHER): Payer: BC Managed Care – PPO | Admitting: Orthopedic Surgery

## 2020-01-03 ENCOUNTER — Other Ambulatory Visit: Payer: Self-pay

## 2020-01-03 DIAGNOSIS — G8929 Other chronic pain: Secondary | ICD-10-CM

## 2020-01-03 DIAGNOSIS — M25512 Pain in left shoulder: Secondary | ICD-10-CM

## 2020-01-03 MED ORDER — OXYCODONE-ACETAMINOPHEN 5-325 MG PO TABS: 1.0000 | ORAL_TABLET | Freq: Four times a day (QID) | ORAL | 0 refills | Status: DC | PRN

## 2020-01-03 NOTE — Progress Notes (Signed)
Office Visit Note   Patient: Jorge Stevens           Date of Birth: 11-01-1985           MRN: [id number] Visit Date: 01/03/2020              Requested by: Tower, Audrie Gallus, MD [address] [place],  [place] [address] PCP: Judy Pimple, MD  Chief Complaint  Patient presents with  . Left Shoulder - Follow-up    S/p cortisone injections 12/13/19      HPI: Patient presents in follow-up today for his left shoulder.  He has a history of 3 shoulder surgeries to most recently by Dr. Lajoyce Corners.  He thinks that he just was sitting around too much during Covid and his shoulder just got stiffer and more painful.  He did do physical therapy before that and he did try to continue the exercises on his own.  Assessment & Plan: Visit Diagnoses:  1. Chronic left shoulder pain     Plan: Patient was seen by Dr. Lajoyce Corners we suggest to go back to therapy was provided a prescription for this today.  We will also give him 1 refill of oxycodone he is to use these sparingly.  He understands that we cannot prescribe these long-term  Follow-Up Instructions: No follow-ups on file.   Ortho Exam  Patient is alert, oriented, no adenopathy, well-dressed, normal affect, normal respiratory effort. Left shoulder no swelling no cellulitis he does have limited motion with forward elevation.  Cannot do any internal rotation behind his back which is limited by pain.  Tender in the anterior shoulder.  Imaging: No results found. No images are attached to the encounter.  Labs: Lab Results  Component Value Date   HGBA1C 5.7 05/02/2018   HGBA1C 5.7 04/06/2016   HGBA1C 5.7 03/27/2015     Lab Results  Component Value Date   ALBUMIN 4.4 05/02/2018   ALBUMIN 3.9 04/06/2016   ALBUMIN 4.4 03/27/2015    No results found for: MG No results found for: VD25OH  No results found for: PREALBUMIN CBC EXTENDED Latest Ref Rng & Units 05/02/2018 06/29/2017 04/06/2016  WBC 4.0 - 10.5 K/uL 5.8 6.4 5.9  RBC 4.22 -  5.81 Mil/uL 5.29 5.31 5.02  HGB 13.0 - 17.0 g/dL 99.3 57.0 17.7  HCT 39 - 52 % 45.3 46.0 41.7  PLT 150 - 400 K/uL 470.0(H) 401(H) 438.0(H)  NEUTROABS 1.4 - 7.7 K/uL 3.2 - 2.7  LYMPHSABS 0.7 - 4.0 K/uL 2.0 - 2.5     Body mass index is 43.54 kg/m.  Orders:  No orders of the defined types were placed in this encounter.  Meds ordered this encounter  Medications  . oxyCODONE-acetaminophen (PERCOCET/ROXICET) 5-325 MG tablet    Sig: Take 1 tablet by mouth every 6 (six) hours as needed for severe pain.    Dispense:  20 tablet    Refill:  0     Procedures: No procedures performed  Clinical Data: No additional findings.  ROS:  All other systems negative, except as noted in the HPI. Review of Systems  Objective: Vital Signs: Ht 6' (1.829 m)   Wt (!) 321 lb (145.6 kg)   BMI 43.54 kg/m   Specialty Comments:  No specialty comments available.  PMFS History: Patient Active Problem List   Diagnosis Date Noted  . Grief reaction 11/24/2018  . Diarrhea 10/08/2018  . Varicose veins of both lower extremities 01/17/2018  . Low back pain  01/17/2018  . Rotator cuff tear arthropathy, left 07/06/2017  . Impingement syndrome of left shoulder   . Pre-diabetes 03/23/2015  . Chronic bursitis of left shoulder 04/23/2014  . Hyperlipidemia 09/26/2012  . Hypertension, essential 12/16/2010  . Routine general medical examination at a health care facility 12/07/2010  . Anal fissure 08/13/2010  . OSA on CPAP 12/01/2008  . WISDOM TEETH EXTRACTION, HX OF 11/26/2008  . Morbid obesity (HCC) 07/28/2006   Past Medical History:  Diagnosis Date  . Anxiety   . Morbid obesity with BMI of 45.0-49.9, adult (HCC)   . Other alteration of consciousness   . Other specified personal history presenting hazards to health(V15.89)   . Shoulder impingement syndrome, left   . Unspecified sleep apnea     Family History  Problem Relation Age of Onset  . Hyperlipidemia Father   . Hyperlipidemia Mother   .  Diabetes Other        grandparents  . Throat cancer Other        grandmother  . Lung cancer Other        grandfather    Past Surgical History:  Procedure Laterality Date  . SHOULDER ARTHROSCOPY Left 07/06/2017   Procedure: LEFT SHOULDER ARTHROSCOPY, DEBRIDEMENT, AND DECOMPRESSION;  Surgeon: Nadara Mustard, MD;  Location[facility];  Service: Orthopedics;  Laterality: Left;  . SHOULDER ARTHROSCOPY W/ SUBACROMIAL DECOMPRESSION AND DISTAL CLAVICLE EXCISION Left    Dr. Kristeen Miss  . TONSILLECTOMY    . WISDOM TOOTH EXTRACTION     Social History   Occupational History  . Occupation: [employer]  Tobacco Use  . Smoking status: Former Smoker    Packs/day: 1.50    Years: 6.00    Pack years: 9.00    Types: Cigarettes    Quit date: 03/16/2007    Years since quitting: 12.8  . Smokeless tobacco: Never Used  Vaping Use  . Vaping Use: Former  Substance and Sexual Activity  . Alcohol use: Yes    Alcohol/week: 0.0 standard drinks    Comment: Occasional  . Drug use: No  . Sexual activity: Not on file

## 2020-01-08 DIAGNOSIS — G4733 Obstructive sleep apnea (adult) (pediatric): Secondary | ICD-10-CM | POA: Diagnosis not present

## 2020-01-16 DIAGNOSIS — Z79899 Other long term (current) drug therapy: Secondary | ICD-10-CM | POA: Diagnosis not present

## 2020-02-02 DIAGNOSIS — G4700 Insomnia, unspecified: Secondary | ICD-10-CM | POA: Diagnosis not present

## 2020-02-02 DIAGNOSIS — F331 Major depressive disorder, recurrent, moderate: Secondary | ICD-10-CM | POA: Diagnosis not present

## 2020-02-08 DIAGNOSIS — G4733 Obstructive sleep apnea (adult) (pediatric): Secondary | ICD-10-CM | POA: Diagnosis not present

## 2020-02-12 DIAGNOSIS — F332 Major depressive disorder, recurrent severe without psychotic features: Secondary | ICD-10-CM | POA: Diagnosis not present

## 2020-03-09 DIAGNOSIS — G4733 Obstructive sleep apnea (adult) (pediatric): Secondary | ICD-10-CM | POA: Diagnosis not present

## 2020-03-13 ENCOUNTER — Ambulatory Visit: Payer: BC Managed Care – PPO | Admitting: Orthopedic Surgery

## 2020-04-07 DIAGNOSIS — F3342 Major depressive disorder, recurrent, in full remission: Secondary | ICD-10-CM | POA: Diagnosis not present

## 2020-04-07 DIAGNOSIS — G4700 Insomnia, unspecified: Secondary | ICD-10-CM | POA: Diagnosis not present

## 2020-06-06 ENCOUNTER — Telehealth: Payer: Self-pay

## 2020-06-06 NOTE — Telephone Encounter (Signed)
LVM for pt to call me regarding FMLA paperwork we received

## 2020-06-06 NOTE — Telephone Encounter (Signed)
Spoke to pt, notified him the fax of FMLA paperwork was incomplete and cut off information on all pages  Pt is dropping off copy of paperwork on Monday

## 2020-06-11 NOTE — Telephone Encounter (Signed)
LVM reminding pt that we need a copy of his FMLA paperwork - he was going to drop off a copy on Mind[place] 3.25.2022 as the one he faxed on 3.16.2022 was cut off

## 2020-06-19 NOTE — Telephone Encounter (Signed)
Spoke w pt to remind about FMLA paperwork- pt stated he will drop off by end of today

## 2020-07-21 NOTE — Telephone Encounter (Signed)
Pt's wife dropped off FMLA paperwork that she filled out  Placed in PCP's inbox for review, sign and date

## 2020-07-22 NOTE — Telephone Encounter (Signed)
Done and in IN box 

## 2020-07-22 NOTE — Telephone Encounter (Signed)
Called pt to inform FMLA paperwork was completed and faxed  Copy for pt pick up   Copy for scan   Copy retained by me

## 2020-07-30 ENCOUNTER — Other Ambulatory Visit: Payer: Self-pay

## 2020-07-30 ENCOUNTER — Telehealth (INDEPENDENT_AMBULATORY_CARE_PROVIDER_SITE_OTHER): Payer: BC Managed Care – PPO | Admitting: Family Medicine

## 2020-07-30 ENCOUNTER — Encounter: Payer: Self-pay | Admitting: Family Medicine

## 2020-07-30 DIAGNOSIS — F4321 Adjustment disorder with depressed mood: Secondary | ICD-10-CM

## 2020-07-30 NOTE — Assessment & Plan Note (Signed)
New loss, mother (she was 64) with sudden cardiac death last week  Some depression and also concern about handling estate (he is the oldest of 3 brothers and they are not helping) Reviewed stressors/ coping techniques/symptoms/ support sources/ tx options and side effects in detail today He works at home and is partially disabled from orthopedic issues Will most likely need 30 more days off for fmla for this loss I order to handle affairs (used up his bereavement time)  Has appt with psychiatry (Dr Delle Reining) and counselor [name]) next week  Continues nortriptyline 200 mg at bedtime and prn xanax Enc self care  Enc setting limits  No SI at this time (if that occurs [facility] to [facility])  Will update

## 2020-07-30 NOTE — Progress Notes (Signed)
Virtual Visit via Video Note  I connected with Jorge Stevens on 07/30/20 at  8:30 AM EDT by a video enabled telemedicine application and verified that I am speaking with the correct person using two identifiers.  Location: Patient: home Provider: office    I discussed the limitations of evaluation and management by telemedicine and the availability of in person appointments. The patient expressed understanding and agreed to proceed.  Parties involved in encounter  Patient: Jorge Stevens  Provider:  Roxy Manns MD   History of Present Illness: Pt presents to discuss grief reaction and FMLA   Last Jul 20, 2022 his mother passed away  Thinks it was MI most likely- found dead  (h/o blood clots and mini strokes) He is the oldest sibling  2 younger brothers   Father is not in picture  He has to work on estate   Emotionally - "spaz out" periodically  Trying to keep his composure around bickering  Not sleeping (no sleep in 3 days)  Feels all alone even though he has people around him   He has appt upcoming with Dr Delle Reining   He does see [name]  Has appt next week also   Can't keep up with work   Has bereavement up until Monday  First day he missed work was last 07-20-22   He works at home but may have to go back into the building  Did paperwork to stay at home longer  Will talk to Dr Delle Reining about a shot leave (suspects no more than 30 days)  Financial strain as well   Psych meds notrtiptyline 200 mg at bedtime Xanax prn tid   Was doing ok until he lost his mother   Lab Results  Component Value Date   CHOL 162 05/02/2018   HDL 31.10 (L) 05/02/2018   LDLCALC 112 (H) 05/02/2018   LDLDIRECT 156.3 11/24/2012   TRIG 97.0 05/02/2018   CHOLHDL 5 05/02/2018   Lab Results  Component Value Date   HGBA1C 5.7 05/02/2018   Patient Active Problem List   Diagnosis Date Noted  . Grief reaction 11/24/2018  . Diarrhea 10/08/2018  . Varicose veins of both lower  extremities 01/17/2018  . Low back pain 01/17/2018  . Rotator cuff tear arthropathy, left 07/06/2017  . Impingement syndrome of left shoulder   . Pre-diabetes 03/23/2015  . Chronic bursitis of left shoulder 04/23/2014  . Hyperlipidemia 09/26/2012  . Hypertension, essential 12/16/2010  . Routine general medical examination at a health care facility 12/07/2010  . Anal fissure 08/13/2010  . OSA on CPAP 12/01/2008  . WISDOM TEETH EXTRACTION, HX OF 11/26/2008  . Morbid obesity (HCC) 07/28/2006   Past Medical History:  Diagnosis Date  . Anxiety   . Morbid obesity with BMI of 45.0-49.9, adult (HCC)   . Other alteration of consciousness   . Other specified personal history presenting hazards to health(V15.89)   . Shoulder impingement syndrome, left   . Unspecified sleep apnea    Past Surgical History:  Procedure Laterality Date  . SHOULDER ARTHROSCOPY Left 07/06/2017   Procedure: LEFT SHOULDER ARTHROSCOPY, DEBRIDEMENT, AND DECOMPRESSION;  Surgeon: Nadara Mustard, MD;  Location: [facility] OR;  Service: Orthopedics;  Laterality: Left;  . SHOULDER ARTHROSCOPY W/ SUBACROMIAL DECOMPRESSION AND DISTAL CLAVICLE EXCISION Left    Dr. Kristeen Miss  . TONSILLECTOMY    . WISDOM TOOTH EXTRACTION     Social History   Tobacco Use  . Smoking status: Former Smoker    Packs/day: 1.50  Years: 6.00    Pack years: 9.00    Types: Cigarettes    Quit date: 03/16/2007    Years since quitting: 13.3  . Smokeless tobacco: Never Used  Vaping Use  . Vaping Use: Former  Substance Use Topics  . Alcohol use: Yes    Alcohol/week: 0.0 standard drinks    Comment: Occasional  . Drug use: No   Family History  Problem Relation Age of Onset  . Hyperlipidemia Father   . Hyperlipidemia Mother   . Early death Mother   . Stroke Mother   . Diabetes Other        grandparents  . Throat cancer Other        grandmother  . Lung cancer Other        grandfather   Allergies  Allergen Reactions  . Robaxin [Methocarbamol]      Sleepy   . Ambien [Zolpidem Tartrate] Itching   Current Outpatient Medications on File Prior to Visit  Medication Sig Dispense Refill  . ALPRAZolam (XANAX) 1 MG tablet Take 1 mg by mouth 3 (three) times daily.    . fluticasone (FLONASE) 50 MCG/ACT nasal spray Place 2 sprays into both nostrils daily as needed for allergies. 16 g 11  . ibuprofen (ADVIL,MOTRIN) 800 MG tablet Take 1 tablet (800 mg total) by mouth every 8 (eight) hours as needed for moderate pain (with a meal). 90 tablet 5  . NON FORMULARY CPAP 12 cm, full face mask    . nortriptyline (PAMELOR) 50 MG capsule Take 200 mg by mouth at bedtime.    . Soft Lens Products (REWETTING DROPS) SOLN Apply 1 drop to eye daily as needed (for dry lenses/lubricant eyes.).     No current facility-administered medications on file prior to visit.   Review of Systems  Constitutional: Positive for malaise/fatigue. Negative for chills and fever.  HENT: Negative for congestion, ear pain, sinus pain and sore throat.   Eyes: Negative for blurred vision, discharge and redness.  Respiratory: Negative for cough, shortness of breath and stridor.   Cardiovascular: Negative for chest pain, palpitations and leg swelling.  Gastrointestinal: Negative for abdominal pain, diarrhea, nausea and vomiting.  Musculoskeletal: Positive for joint pain. Negative for myalgias.  Skin: Negative for rash.  Neurological: Negative for dizziness and headaches.   Observations/Objective: Patient appears well, in no distress Weight is baseline (obese) No facial swelling or asymmetry Normal voice-not hoarse and no slurred speech No obvious tremor or mobility impairment Moving neck and UEs normally Able to hear the call well  No cough or shortness of breath during interview  Talkative and mentally sharp with no cognitive changes No skin changes on face or neck , no rash or pallor Affect is depressed/ tearful at times  Attentive and pleasant  No SI    Assessment and  Plan: Problem List Items Addressed This Visit      Other   Grief reaction - Primary    New loss, mother (she was 70) with sudden cardiac death last week  Some depression and also concern about handling estate (he is the oldest of 3 brothers and they are not helping) Reviewed stressors/ coping techniques/symptoms/ support sources/ tx options and side effects in detail today He works at home and is partially disabled from orthopedic issues Will most likely need 30 more days off for fmla for this loss I order to handle affairs (used up his bereavement time)  Has appt with psychiatry (Dr Delle Reining) and counselor [name]) next week  Continues nortriptyline 200 mg at bedtime and prn xanax Enc self care  Enc setting limits  No SI at this time (if that occurs go to [facility])  Will update          Follow Up Instructions: Follow up with psychiatry and your therapist as planned  Continue current medications  Try to get family help when you can  Take care of yourself the best you can  If you feel worse or suicidal please alert [place] and Dr Delle Reining  and go to [place]    I discussed the assessment and treatment plan with the patient. The patient was provided an opportunity to ask questions and all were answered. The patient agreed with the plan and demonstrated an understanding of the instructions.   The patient was advised to call back or seek an in-person evaluation if the symptoms worsen or if the condition fails to improve as anticipated.     Roxy Manns, MD

## 2020-07-30 NOTE — Patient Instructions (Signed)
Follow up with psychiatry and your therapist as planned  Continue current medications  Try to get family help when you can  Take care of yourself the best you can  If you feel worse or suicidal please alert [place] and Dr Delle Reining  and go to [place]

## 2020-07-31 ENCOUNTER — Telehealth: Payer: Self-pay

## 2020-07-31 NOTE — Telephone Encounter (Signed)
I wrote the letter -he should see in mychart If he needs it printed, please do and I will sign it tomorrow

## 2020-07-31 NOTE — Telephone Encounter (Signed)
Letter printed and placed on Dr. Royden Purl desk for signature. Informed patient that we will call when the letter is available for pick up. Patient verbalized understanding.

## 2020-07-31 NOTE — Telephone Encounter (Signed)
Pt states that he forgot to mention to you that he needs a note to take him out of work through Friday of this week as he has the psych appt next week...Marland Kitchen please advise

## 2020-08-01 DIAGNOSIS — F4321 Adjustment disorder with depressed mood: Secondary | ICD-10-CM | POA: Diagnosis not present

## 2020-08-01 DIAGNOSIS — F331 Major depressive disorder, recurrent, moderate: Secondary | ICD-10-CM | POA: Diagnosis not present

## 2020-08-01 NOTE — Telephone Encounter (Signed)
Sent mychart letting pt know letter is in [place] but also paper copy placed at the front for pick up

## 2020-08-01 NOTE — Telephone Encounter (Signed)
done

## 2020-08-23 DIAGNOSIS — G4700 Insomnia, unspecified: Secondary | ICD-10-CM | POA: Diagnosis not present

## 2020-08-23 DIAGNOSIS — F331 Major depressive disorder, recurrent, moderate: Secondary | ICD-10-CM | POA: Diagnosis not present

## 2020-08-23 DIAGNOSIS — F332 Major depressive disorder, recurrent severe without psychotic features: Secondary | ICD-10-CM | POA: Diagnosis not present

## 2020-08-29 ENCOUNTER — Other Ambulatory Visit: Payer: Self-pay | Admitting: Orthopedic Surgery

## 2020-09-25 ENCOUNTER — Telehealth (HOSPITAL_COMMUNITY): Payer: Self-pay | Admitting: Psychiatry

## 2020-09-25 DIAGNOSIS — F331 Major depressive disorder, recurrent, moderate: Secondary | ICD-10-CM | POA: Diagnosis not present

## 2020-09-25 DIAGNOSIS — F4321 Adjustment disorder with depressed mood: Secondary | ICD-10-CM | POA: Diagnosis not present

## 2020-09-25 NOTE — Telephone Encounter (Signed)
D:  Dr. Milagros Evener referred pt to MH-IOP.  A:  Returned pt's phone call to orient him, but there was no answer.  Left vm for pt to call case manager back.  Inform Dr. Evelene Croon.

## 2020-09-30 ENCOUNTER — Other Ambulatory Visit: Payer: Self-pay

## 2020-09-30 ENCOUNTER — Other Ambulatory Visit (HOSPITAL_COMMUNITY): Payer: BC Managed Care – PPO | Attending: Psychiatry | Admitting: Psychiatry

## 2020-09-30 DIAGNOSIS — F332 Major depressive disorder, recurrent severe without psychotic features: Secondary | ICD-10-CM | POA: Insufficient documentation

## 2020-09-30 NOTE — Progress Notes (Signed)
Comprehensive Clinical Assessment (CCA) Note  09/30/2020 Jorge Stevens [id number]  Chief Complaint:  Chief Complaint  Patient presents with   Anxiety   Depression   Visit Diagnosis: F 33.2    CCA Screening, Triage and Referral (STR)  Patient Reported Information How did you hear about [place]? Other (Comment)  Referral name: Dr. Milagros Evener  Referral phone number: No data recorded  Whom do you see for routine medical problems? Primary Care  Practice/Facility Name: No data recorded Practice/Facility Phone Number: No data recorded Name of Contact: [facility]  Contact Number: No data recorded Contact Fax Number: No data recorded Prescriber Name: [facility]  Prescriber Address (if known): No data recorded  What Is the Reason for Your Visit/Call Today? Worsening depression/anxiety  How Long Has This Been Causing You Problems? 1-6 months  What Do You Feel Would Help You the Most Today? Stress Management; Treatment for Depression or other mood problem   Have You Recently Been in Any Inpatient Treatment (Hospital/Detox/Crisis Center/28-Day Program)? No  Name/Location of Program/Hospital:No data recorded How Long Were You There? No data recorded When Were You Discharged? No data recorded  Have You Ever Received Services From [facility] Before? No  Who Do You See at [facility]? No data recorded  Have You Recently Had Any Thoughts About Hurting Yourself? No  Are You Planning to Commit Suicide/Harm Yourself At This time? No   Have you Recently Had Thoughts About Hurting Someone Jorge Stevens? No  Explanation: No data recorded  Have You Used Any Alcohol or Drugs in the Past 24 Hours? No  How Long Ago Did You Use Drugs or Alcohol? No data recorded What Did You Use and How Much? No data recorded  Do You Currently Have a Therapist/Psychiatrist? Yes  Name of Therapist/Psychiatrist: Dr. Janace Hoard and [name], LCSW   Have You Been Recently Discharged From Any Office Practice or  Programs? No  Explanation of Discharge From Practice/Program: No data recorded    CCA Screening Triage Referral Assessment Type of Contact: No data recorded Is this Initial or Reassessment? No data recorded Date Telepsych consult ordered in [facility]:  No data recorded Time Telepsych consult ordered in [facility]:  No data recorded  Patient Reported Information Reviewed? No data recorded Patient Left Without Being Seen? No data recorded Reason for Not Completing Assessment: No data recorded  Collateral Involvement: No data recorded  Does Patient Have a Court Appointed Legal Guardian? No data recorded Name and Contact of Legal Guardian: No data recorded If Minor and Not Living with Parent(s), Who has Custody? No data recorded Is CPS involved or ever been involved? Never  Is APS involved or ever been involved? Never   Patient Determined To Be At Risk for Harm To Self or Others Based on Review of Patient Reported Information or Presenting Complaint? No  Method: No data recorded Availability of Means: No data recorded Intent: No data recorded Notification Required: No data recorded Additional Information for Danger to Others Potential: No data recorded Additional Comments for Danger to Others Potential: No data recorded Are There Guns or Other Weapons in Your Home? No data recorded Types of Guns/Weapons: No data recorded Are These Weapons Safely Secured?                            No data recorded Who Could Verify You Are Able To Have These Secured: No data recorded Do You Have any Outstanding Charges, Pending Court Dates, Parole/Probation?  No data recorded Contacted To Inform of Risk of Harm To Self or Others: No data recorded  Location of Assessment: Other (comment)   Does Patient Present under Involuntary Commitment? No  IVC Papers Initial File Date: No data recorded  [place] of Residence: [place]   Patient Currently Receiving the Following Services: Individual  Therapy   Determination of Need: Routine (7 days)   Options For Referral: Intensive Outpatient Therapy     CCA Biopsychosocial Intake/Chief Complaint:  This is a 35 yr old married, employed, [place] American male who was referred per Dr. Milagros Evener; treatment for worsening depressive/anxiety symptoms.  Stressors:  1) unresolved grief/loss issue:  Mother passed 07-21-20 and wife had a still born birth Aug. 2020.  States that the day she had a still born; her blood sugar reached 980 and she almost died herself.  According to pt, since mother's funeral; he and his two brothers haven't spoken d/t them not paying their half of the funeral costs.  2) Job ([employer]) of five yrs.  Pt has been out of work since May 2022.  Pt works within [employer]; and states he hasn't been able to function on the job whenever he was working. 3) Chronic Left Shoulder Pain.  Pt denies any previous psych admits or suicide attempts or gestures.  Has been seeing Dr. Janace Hoard and [name], LCSW for 12 yrs total.  Family hx:  Deceased mother (depression).  Pt denies SI/HI or A/V hallucinatiions.  Current Symptoms/Problems: Sadness, anxiety, poor concentration, loss of motivation, irritable, anhedonia, no energy, isolative, poor sleep, poor appetite (lost 10 lbs within May 2022), tearfulness, feelings of worthlessness, helplessness and hopelessness.   Patient Reported Schizophrenia/Schizoaffective Diagnosis in Past: No   Strengths: "I am a good provider.  I take care of everything."  Preferences: "I need to realize that I can't do everything by myself."  Abilities: No data recorded  Type of Services Patient Feels are Needed: MH-IOP   Initial Clinical Notes/Concerns: No data recorded  Mental Health Symptoms Depression:   Change in energy/activity; Difficulty Concentrating; Fatigue; Hopelessness; Increase/decrease in appetite; Irritability; Sleep (too much or little); Tearfulness; Weight  gain/loss; Worthlessness   Duration of Depressive symptoms:  Greater than two weeks   Mania:   N/A   Anxiety:    Worrying   Psychosis:   None   Duration of Psychotic symptoms: No data recorded  Trauma:   N/A   Obsessions:   N/A   Compulsions:   N/A   Inattention:   N/A   Hyperactivity/Impulsivity:   N/A   Oppositional/Defiant Behaviors:   N/A   Emotional Irregularity:   N/A   Other Mood/Personality Symptoms:  No data recorded   Mental Status Exam Appearance and self-care  Stature:   Tall   Weight:   Overweight   Clothing:   Casual   Grooming:   Normal   Cosmetic use:   None   Posture/gait:   Normal   Motor activity:   Not Remarkable   Sensorium  Attention:   Normal   Concentration:   Anxiety interferes   Orientation:   X5   Recall/memory:   Normal   Affect and Mood  Affect:   Anxious   Mood:   Depressed   Relating  Eye contact:   Normal   Facial expression:   Sad   Attitude toward examiner:   Cooperative   Thought and Language  Speech flow:  Normal   Thought content:  Appropriate to Mood and Circumstances   Preoccupation:   None   Hallucinations:   None   Organization:  No data recorded  [employer] of Knowledge:   Average   Intelligence:   Average   Abstraction:   Normal   Judgement:   Good   Reality Testing:   Adequate   Insight:   Good   Decision Making:   Vacilates   Social Functioning  Social Maturity:   Isolates   Social Judgement:   Normal   Stress  Stressors:   Grief/losses; Work   Coping Ability:   Human resources officer Deficits:   Activities of daily living; Communication; Interpersonal   Supports:   Family (wife and son)     Religion: Religion/Spirituality Are You A Religious Person?: Yes What is Your Religious Affiliation?: Christian  Leisure/Recreation: Leisure / Recreation Do You Have Hobbies?: Yes Leisure and Hobbies: taking son  fishing  Exercise/Diet: Exercise/Diet Do You Exercise?: No Have You Gained or Lost A Significant Amount of Weight in the Past Six Months?: Yes-Lost Number of Pounds Lost?: 10 Do You Follow a Special Diet?: No Do You Have Any Trouble Sleeping?: Yes Explanation of Sleeping Difficulties: either sleeps during the day and not at night   CCA Employment/Education Employment/Work Situation: Employment / Work Situation Employment Situation: Employed Where is Patient Currently Employed?: [employer] How Long has Patient Been Employed?: 5 yrs Are You Satisfied With Your Job?: Yes Do You Work More Than One Job?: No Work Stressors: 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  Dr. Milagros Evener has had pt out since pt's mother died 08-15-2022) Patient's Job has Been Impacted by Current Illness: Yes Describe how Patient's Job has Been Impacted: Job is based on a performance scale and it's been difficult for him to focus. Has Patient ever Been in the [employer]?: No  Education: Education Is Patient Currently Attending School?: No Did Garment/textile technologist From [employer]?: Yes Did You Attend College?: Yes What Type of College Degree Do you Have?: Did some college Did You Attend Graduate School?: No What Was Your Major?: Nursing Did You Have An Individualized Education Program (IIEP): No Did You Have Any Difficulty At School?: No Patient's Education Has Been Impacted by Current Illness:  (stopped d/t mother's death)   CCA Family/Childhood History Family and Relationship History: Family history Marital status: Married (Whenever he married wife at age 8; his mother packed up and left for 5 yrs without telling him where she was going.) Number of Years Married: 17 What types of issues is patient dealing with in the relationship?: Wife is very supportive. Are you sexually active?: Yes What is your sexual orientation?: Heterosexual Does patient have  children?: Yes How many children?: 1 How is patient's relationship with their children?: 37 yr old son; in 2020 had stillborn daughter  Childhood History:  Childhood History By whom was/is the patient raised?: Mother Additional childhood history information: Born in [place].  Raised by mother.  At age 32; parents divorced d/t his infidelity.  "I was back and forth between both parents.  I had to grow up faster than expected."  School was ok.  Denies any trauma. Description of patient's relationship with caregiver when they were a child: Was closer to mom; once father left the marriage.  She passed away one month ago. Does patient have siblings?: Yes Number of Siblings: 2 Description of patient's current relationship with siblings: 2 brothers (younger)  ages 7333 and 7232; currently not talking after mom's funeral. Did patient suffer any verbal/emotional/physical/sexual abuse as a child?: No Did patient suffer from severe childhood neglect?: No Has patient ever been sexually abused/assaulted/raped as an adolescent or adult?: No Was the patient ever a victim of a crime or a disaster?: No Witnessed domestic violence?: No Has patient been affected by domestic violence as an adult?: No  Child/Adolescent Assessment:     CCA Substance Use Alcohol/Drug Use: Alcohol / Drug Use Pain Medications: cc: MAR Prescriptions: cc: MAR Over the Counter: cc: MAR History of alcohol / drug use?: No history of alcohol / drug abuse                         ASAM's:  Six Dimensions of Multidimensional Assessment  Dimension 1:  Acute Intoxication and/or Withdrawal Potential:      Dimension 2:  Biomedical Conditions and Complications:      Dimension 3:  Emotional, Behavioral, or Cognitive Conditions and Complications:     Dimension 4:  Readiness to Change:     Dimension 5:  Relapse, Continued use, or Continued Problem Potential:     Dimension 6:  Recovery/Living Environment:     ASAM Severity Score:     ASAM Recommended Level of Treatment:     Substance use Disorder (SUD)    Recommendations for Services/Supports/Treatments: Recommendations for Services/Supports/Treatments Recommendations For Services/Supports/Treatments: IOP (Intensive Outpatient Program)  DSM5 Diagnoses: Patient Active Problem List   Diagnosis Date Noted   Grief reaction 11/24/2018   Diarrhea 10/08/2018   Varicose veins of both lower extremities 01/17/2018   Low back pain 01/17/2018   Rotator cuff tear arthropathy, left 07/06/2017   Impingement syndrome of left shoulder    Pre-diabetes 03/23/2015   Chronic bursitis of left shoulder 04/23/2014   Hyperlipidemia 09/26/2012   Hypertension, essential 12/16/2010   Routine general medical examination at a health care facility 12/07/2010   Anal fissure 08/13/2010   OSA on CPAP 12/01/2008   WISDOM TEETH EXTRACTION, HX OF 11/26/2008   Morbid obesity (HCC) 07/28/2006    Patient Centered Plan: Patient is on the following Treatment Plan(s):  Anxiety and Depression Pt will improve his mood as evidenced by being happy again, managing mood and coping with daily stressors for 5 out of 7 days for 60 days.   Oriented pt to MH-IOP.  Pt gave verbal consent for tx, to release chart information to referred providers and to complete any forms if needed.  Pt also gave consent for attending group virtually d/t COVID-19 social distancing restrictions. Encourage support groups.  F/U with Dr. Evelene CroonKaur and Hurley CiscoBarbara Fousek, LCSW.  Referrals to Alternative Service(s): Referred to Alternative Service(s):   Place:   Date:   Time:    Referred to Alternative Service(s):   Place:   Date:   Time:    Referred to Alternative Service(s):   Place:   Date:   Time:    Referred to Alternative Service(s):   Place:   Date:   Time:     Estera Ozier, RITA, M.Ed, CNA

## 2020-10-02 ENCOUNTER — Other Ambulatory Visit (HOSPITAL_COMMUNITY): Payer: BC Managed Care – PPO | Admitting: Psychiatry

## 2020-10-02 ENCOUNTER — Other Ambulatory Visit: Payer: Self-pay | Admitting: Orthopedic Surgery

## 2020-10-02 ENCOUNTER — Encounter (HOSPITAL_COMMUNITY): Payer: Self-pay | Admitting: Psychiatry

## 2020-10-02 ENCOUNTER — Other Ambulatory Visit: Payer: Self-pay

## 2020-10-02 DIAGNOSIS — F332 Major depressive disorder, recurrent severe without psychotic features: Secondary | ICD-10-CM | POA: Diagnosis not present

## 2020-10-02 DIAGNOSIS — F4321 Adjustment disorder with depressed mood: Secondary | ICD-10-CM

## 2020-10-02 NOTE — Progress Notes (Signed)
Virtual Visit via Video Note  I connected with Jorge Stevens on 10/02/20 at  9:00 AM EDT by a video enabled telemedicine application and verified that I am speaking with the correct person using two identifiers.  At orientation to the IOP program, Case Manager discussed the limitations of evaluation and management by telemedicine and the availability of in person appointments. The patient expressed understanding and agreed to proceed with virtual visits throughout the duration of the program.   Location:  Patient: Patient Home Provider: Home Office   History of Present Illness: Grief Reaction  Observations/Objective: Check In: Case Manager checked in with all participants to review discharge dates, insurance authorizations, work-related documents and needs from the treatment team regarding medications. Client stated needs and engaged in discussion. Case Manager introduced a new Client to the group, with group members welcoming and starting the joining process.  Initial Therapeutic Activity: Counselor facilitated a check-in with group members to assess mood and current functioning. Client shared details of their mental health management since our last session, including challenges and successes. Counselor engaged group in discussion, covering the following topics: fear and anxiety, future planning, coping skills, and positive affirmaitons. Client presents with moderate depression and moderate anxiety. Client denied any current SI/HI/psychosis.   Second Therapeutic Activity: Counselor introduced [place], [place] Chaplain to present information and discussion on Grief and Loss. Group members engaged in discussion, sharing how grief impacts them, what comforts them, what emotions are felt, labeling losses, etc. After guest speaker logged off, Counselor prompted group to spend 10-15 minutes journaling to process personal grief and loss situations. Counselor processed entries with group and  client's identified areas for additional processing in individual therapy. Client noted grief related to loss of mother and child.  Check Out:  Counselor prompted group members to identify one self-care practice or productivity activity they would like to engage in today. Client plans to eat lunch with fmaily. Client endorsed safety plan to be followed to prevent safety issues.  Assessment and Plan: Clinician recommends that Client remain in IOP treatment to better manage mental health symptoms, stabilization and to address treatment plan goals. Clinician recommends adherence to crisis/safety plan, taking medications as prescribed, and following up with medical professionals if any issues arise.    Follow Up Instructions: Clinician will send Webex link for next session. The Client was advised to call back or seek an in-person evaluation if the symptoms worsen or if the condition fails to improve as anticipated.     I provided 180 minutes of non-face-to-face time during this encounter.     Hilbert Odor, LCSW

## 2020-10-03 ENCOUNTER — Other Ambulatory Visit (HOSPITAL_COMMUNITY): Payer: BC Managed Care – PPO | Admitting: Psychiatry

## 2020-10-03 ENCOUNTER — Other Ambulatory Visit: Payer: Self-pay

## 2020-10-03 DIAGNOSIS — F4321 Adjustment disorder with depressed mood: Secondary | ICD-10-CM

## 2020-10-03 DIAGNOSIS — F332 Major depressive disorder, recurrent severe without psychotic features: Secondary | ICD-10-CM | POA: Diagnosis not present

## 2020-10-06 ENCOUNTER — Other Ambulatory Visit: Payer: Self-pay

## 2020-10-06 ENCOUNTER — Encounter (HOSPITAL_COMMUNITY): Payer: Self-pay

## 2020-10-06 ENCOUNTER — Other Ambulatory Visit (HOSPITAL_COMMUNITY): Payer: BC Managed Care – PPO | Admitting: Psychiatry

## 2020-10-06 DIAGNOSIS — F4321 Adjustment disorder with depressed mood: Secondary | ICD-10-CM

## 2020-10-06 DIAGNOSIS — F331 Major depressive disorder, recurrent, moderate: Secondary | ICD-10-CM

## 2020-10-06 NOTE — Progress Notes (Signed)
Virtual Visit via Video Note  I connected with Jorge Stevens on 10/03/20 at  9:00 AM EDT by a video enabled telemedicine application and verified that I am speaking with the correct person using two identifiers.  At orientation to the IOP program, Case Manager discussed the limitations of evaluation and management by telemedicine and the availability of in person appointments. The patient expressed understanding and agreed to proceed with virtual visits throughout the duration of the program.   Location:  Patient: Patient Home Provider: Home Office   History of Present Illness: Grief reaction  Observations/Objective: Check In: Case Manager checked in with all participants to review discharge dates, insurance authorizations, work-related documents and needs from the treatment team regarding medications. Client stated needs and engaged in discussion. Case Manager introduced a new Client to the group, with group members welcoming and starting the joining process.  Initial Therapeutic Activity: Counselor facilitated a check-in with group members to assess mood and current functioning. Client shared details of their mental health management since our last session, including challenges and successes. Counselor engaged group in discussion, covering the following topics: self-care plan, weekend stability plan, strategies in preventing and coping with panic attacks, and about local resources. Client presents with moderate depression and moderate anxiety. Client denied any current SI/HI/psychosis.   Second Therapeutic Activity: Counselor shared psychoeducation on grounding and mindfulness techniques. Counselor facilitated a guided imagery which incorporated deep breathing, progressive muscle relaxation, visualizations, and positive affirmations, called Finding Your Authentic Self. Group members participated in the prompts and scripts, reengaging when attention wandered. Counselor allowed group members  to share about their experiences. Client stated that it was relaxing and that they would be open to listening to guided imageries in the future.   Check Out:  Counselor prompted group members to identify one self-care practice or productivity activity they would like to engage in over the weekend. Client plans to go fishing with his son. Client endorsed safety plan to be followed to prevent safety issues.  Assessment and Plan: Clinician recommends that Client remain in IOP treatment to better manage mental health symptoms, stabilization and to address treatment plan goals. Clinician recommends adherence to crisis/safety plan, taking medications as prescribed, and following up with medical professionals if any issues arise.    Follow Up Instructions: Clinician will send Webex link for next session. The Client was advised to call back or seek an in-person evaluation if the symptoms worsen or if the condition fails to improve as anticipated.     I provided 180 minutes of non-face-to-face time during this encounter.  Hilbert Odor, LCSW

## 2020-10-06 NOTE — Progress Notes (Signed)
Virtual Visit via Telephone Note  I connected with Jorge Stevens on 10/06/20 at  9:00 AM EDT by telephone and verified that I am speaking with the correct person using two identifiers.  Location: Patient: Home Provider: Office   I discussed the limitations, risks, security and privacy concerns of performing an evaluation and management service by telephone and the availability of in person appointments. I also discussed with the patient that there may be a patient responsible charge related to this service. The patient expressed understanding and agreed to proceed.   I discussed the assessment and treatment plan with the patient. The patient was provided an opportunity to ask questions and all were answered. The patient agreed with the plan and demonstrated an understanding of the instructions.   The patient was advised to call back or seek an in-person evaluation if the symptoms worsen or if the condition fails to improve as anticipated.  I provided 15 minutes of non-face-to-face time during this encounter.   Oneta Rack, NP    Psychiatric Initial Adult Assessment   Patient Identification: Jorge Stevens MRN:  [id number] Date of Evaluation:  10/06/2020 Referral Source: Dr. Simmie Davies Chief Complaint:    Depression grief and loss Visit Diagnosis: No diagnosis found.  History of Present Illness:  Jorge Stevens 35 year old African-American male presents after referral by his primary psychiatrist.  He reports worsening depression, mood irritability and anxiety.  Reports experiencing grief and loss related to the stillbirth of his child.  Reports ongoing ruminations,  poor concentration and sadness.  Reports he is unable to function well at work as he has been employed at [employer] of [place] for the past 5 years.  States when I had children crying in the background and get distracted irritable and depressed.  Reports his mother passed 2 months ago.  States very close  relationship between he and his mother.  Jorge Stevens denied previous inpatient admissions.  Denies suicidal or homicidal ideations.  Denies auditory or visual hallucinations.  Denies any illicit drug use substance abuse history.  Does report drinking alcohol occasionally.  Jorge Stevens reported that he is currently prescribed xanax 1 mg , adderall 30 mg and Pamelor 50 mg. He reported taken and tolerating medications well. Denied any medications side effects. Patient to start intensive outpatient program on 10/06/2020  Associated Signs/Symptoms: Depression Symptoms:  depressed mood, feelings of worthlessness/guilt, difficulty concentrating, hopelessness, anxiety, (Hypo) Manic Symptoms:  Irritable Mood, Anxiety Symptoms:  Excessive Worry, Social Anxiety, Psychotic Symptoms:  Hallucinations: None PTSD Symptoms: NA  Past Psychiatric History:   Previous Psychotropic Medications: No   Substance Abuse History in the last 12 months:  No.  Consequences of Substance Abuse: NA  Past Medical History:  Past Medical History:  Diagnosis Date   Anxiety    Morbid obesity with BMI of 45.0-49.9, adult (HCC)    Other alteration of consciousness    Other specified personal history presenting hazards to health(V15.89)    Shoulder impingement syndrome, left    Unspecified sleep apnea     Past Surgical History:  Procedure Laterality Date   SHOULDER ARTHROSCOPY Left 07/06/2017   Procedure: LEFT SHOULDER ARTHROSCOPY, DEBRIDEMENT, AND DECOMPRESSION;  Surgeon: Nadara Mustard, MD;  Location: [facility] OR;  Service: Orthopedics;  Laterality: Left;   SHOULDER ARTHROSCOPY W/ SUBACROMIAL DECOMPRESSION AND DISTAL CLAVICLE EXCISION Left    Dr. Kristeen Miss   TONSILLECTOMY     WISDOM TOOTH EXTRACTION      Family Psychiatric History:   Family History:  Family  History  Problem Relation Age of Onset   Depression Mother    Hyperlipidemia Mother    Early death Mother    Stroke Mother    Hyperlipidemia Father    Diabetes  Other        grandparents   Throat cancer Other        grandmother   Lung cancer Other        grandfather    Social History:   Social History   Socioeconomic History   Marital status: Married    Spouse name: Not on file   Number of children: 1   Years of education: Not on file   Highest education level: Not on file  Occupational History   Occupation: [employer]  Tobacco Use   Smoking status: Former    Packs/day: 1.50    Years: 6.00    Pack years: 9.00    Types: Cigarettes    Quit date: 03/16/2007    Years since quitting: 13.5   Smokeless tobacco: Never  Vaping Use   Vaping Use: Former  Substance and Sexual Activity   Alcohol use: Yes    Alcohol/week: 0.0 standard drinks    Comment: Occasional   Drug use: No   Sexual activity: Not on file  Other Topics Concern   Not on file  Social History Narrative   Married      Works at [employer], likes his job      Occasional coffee      One son 2010   Social Determinants of Corporate investment banker Strain: Not on file  Food Insecurity: Not on file  Transportation Needs: Not on file  Physical Activity: Not on file  Stress: Not on file  Social Connections: Not on file    Additional Social History:   Allergies:   Allergies  Allergen Reactions   Robaxin [Methocarbamol]     Sleepy    Ambien [Zolpidem Tartrate] Itching    Metabolic Disorder Labs: Lab Results  Component Value Date   HGBA1C 5.7 05/02/2018   No results found for: PROLACTIN Lab Results  Component Value Date   CHOL 162 05/02/2018   TRIG 97.0 05/02/2018   HDL 31.10 (L) 05/02/2018   CHOLHDL 5 05/02/2018   VLDL 19.4 05/02/2018   LDLCALC 112 (H) 05/02/2018   LDLCALC 109 (H) 04/06/2016   Lab Results  Component Value Date   TSH 0.47 05/02/2018    Therapeutic Level Labs: No results found for: LITHIUM No results found for: CBMZ No results found for: VALPROATE  Current Medications: Current Outpatient Medications  Medication Sig Dispense  Refill   ALPRAZolam (XANAX) 1 MG tablet Take 1 mg by mouth 3 (three) times daily.     amphetamine-dextroamphetamine (ADDERALL) 30 MG tablet Take 30 mg by mouth daily.     cloNIDine (CATAPRES) 0.1 MG tablet Take 0.1 mg by mouth daily. 1-3 tabs at QHS     fluticasone (FLONASE) 50 MCG/ACT nasal spray Place 2 sprays into both nostrils daily as needed for allergies. 16 g 11   ibuprofen (ADVIL,MOTRIN) 800 MG tablet Take 1 tablet (800 mg total) by mouth every 8 (eight) hours as needed for moderate pain (with a meal). 90 tablet 5   NON FORMULARY CPAP 12 cm, full face mask     nortriptyline (PAMELOR) 50 MG capsule Take 200 mg by mouth at bedtime.     Soft Lens Products (REWETTING DROPS) SOLN Apply 1 drop to eye daily as needed (for dry lenses/lubricant eyes.).  No current facility-administered medications for this visit.    Musculoskeletal: Strength & Muscle Tone: within normal limits Gait & Station: normal Patient leans: N/A  Psychiatric Specialty Exam: Review of Systems  There were no vitals taken for this visit.There is no height or weight on file to calculate BMI.  General Appearance: Casual  Eye Contact:  Good  Speech:  Clear and Coherent  Volume:  Normal  Mood:  Anxious and Depressed  Affect:  Congruent  Thought Process:  Coherent  Orientation:  Full (Time, Place, and Person)  Thought Content:  Rumination  Suicidal Thoughts:  No  Homicidal Thoughts:  No  Memory:  Immediate;   Good Recent;   Good  Judgement:  Good  Insight:  Good  Psychomotor Activity:  Normal  Concentration:  Concentration: Fair  Recall:  Good  Fund of Knowledge:Good  Language: Good  Akathisia:  No  Handed:  Right  AIMS (if indicated):  done  Assets:  Communication Skills Desire for Improvement Social Support Talents/Skills  ADL's:  Intact  Cognition: WNL  Sleep:  Fair   Screenings: Insurance account manager from 09/30/2020 in BEHAVIORAL HEALTH INTENSIVE PSYCH  PHQ-2 Total Score 5   PHQ-9 Total Score 22      Flowsheet Row Counselor from 09/30/2020 in BEHAVIORAL HEALTH INTENSIVE PSYCH  C-SSRS RISK CATEGORY No Risk       Assessment and Plan:   Patient to start Intensive Outpatient Program (IOP)  Continue medication as directed   Treatment plan was reviewed and agreed upon by NP T.Melvyn Neth and patient Jorge Stevens need for group services.    Oneta Rack, NP 7/25/20229:22 AM

## 2020-10-07 ENCOUNTER — Other Ambulatory Visit (HOSPITAL_COMMUNITY): Payer: BC Managed Care – PPO | Admitting: Psychiatry

## 2020-10-07 ENCOUNTER — Other Ambulatory Visit: Payer: Self-pay

## 2020-10-07 ENCOUNTER — Encounter (HOSPITAL_COMMUNITY): Payer: Self-pay | Admitting: Psychiatry

## 2020-10-08 ENCOUNTER — Other Ambulatory Visit (HOSPITAL_COMMUNITY): Payer: BC Managed Care – PPO | Admitting: Psychiatry

## 2020-10-09 ENCOUNTER — Encounter (HOSPITAL_COMMUNITY): Payer: Self-pay

## 2020-10-09 ENCOUNTER — Other Ambulatory Visit: Payer: Self-pay

## 2020-10-09 ENCOUNTER — Other Ambulatory Visit (HOSPITAL_COMMUNITY): Payer: BC Managed Care – PPO | Admitting: Psychiatry

## 2020-10-09 DIAGNOSIS — F332 Major depressive disorder, recurrent severe without psychotic features: Secondary | ICD-10-CM | POA: Diagnosis not present

## 2020-10-09 DIAGNOSIS — F331 Major depressive disorder, recurrent, moderate: Secondary | ICD-10-CM

## 2020-10-09 DIAGNOSIS — F4321 Adjustment disorder with depressed mood: Secondary | ICD-10-CM

## 2020-10-09 NOTE — Progress Notes (Signed)
Virtual Visit via Video Note  I connected with Jorge Stevens on 10/09/20 at  9:00 AM EDT by a video enabled telemedicine application and verified that I am speaking with the correct person using two identifiers.  At orientation to the IOP program, Case Manager discussed the limitations of evaluation and management by telemedicine and the availability of in person appointments. The patient expressed understanding and agreed to proceed with virtual visits throughout the duration of the program.   Location:  Patient: Patient Home Provider: Home Office   History of Present Illness: MDD  Observations/Objective: Check In: Case Manager checked in with all participants to review discharge dates, insurance authorizations, work-related documents and needs from the treatment team regarding medications. Client stated needs and engaged in discussion. Case Manager introduced 2 new Clients to the group, with group members welcoming and starting the joining process.  Initial Therapeutic Activity: Counselor introduced [employer], [place] Chaplain to present information and discussion on Grief and Loss. Group members engaged in discussion, sharing how grief impacts them, what comforts them, what emotions are felt, labeling losses, etc. After guest speaker logged off, Counselor prompted group to spend 10-15 minutes journaling to process personal grief and loss situations. Counselor processed entries with group and client's identified areas for additional processing in individual therapy. Client noted grief related to loss of child, mother and allowing others to control his use of time.   Second Therapeutic Activity: Counselor facilitated a check-in with group members to assess mood and current functioning. Client shared details of their mental health management since our last session, including challenges and successes. Counselor engaged group in discussion, covering the following topics: mental health  stigma in minority communities, surviving suicide attempts/thoughts, progress in coping skill application, structuring time for self-care and employment concerns. Client presents with moderate depression and moderate anxiety. Client denied any current SI/HI/psychosis.  Check Out:  Counselor closed program by allowing time to celebrate a graduating group member. Counselor shared reflections on progress and allow space for group members to share well wishes and encouragements with the graduating client. Counselor prompted graduating client to share takeaways, reflect on progress and final thoughts for the group. Client endorsed safety plan to be followed to prevent safety issues.  Assessment and Plan: Clinician recommends that Client remain in IOP treatment to better manage mental health symptoms, stabilization and to address treatment plan goals. Clinician recommends adherence to crisis/safety plan, taking medications as prescribed, and following up with medical professionals if any issues arise.    Follow Up Instructions: Clinician will send Webex link for next session. The Client was advised to call back or seek an in-person evaluation if the symptoms worsen or if the condition fails to improve as anticipated.     I provided 180 minutes of non-face-to-face time during this encounter.     Hilbert Odor, LCSW

## 2020-10-10 ENCOUNTER — Other Ambulatory Visit (HOSPITAL_COMMUNITY): Payer: BC Managed Care – PPO | Admitting: Psychiatry

## 2020-10-10 ENCOUNTER — Encounter (HOSPITAL_COMMUNITY): Payer: Self-pay

## 2020-10-10 DIAGNOSIS — F331 Major depressive disorder, recurrent, moderate: Secondary | ICD-10-CM

## 2020-10-10 DIAGNOSIS — F332 Major depressive disorder, recurrent severe without psychotic features: Secondary | ICD-10-CM | POA: Diagnosis not present

## 2020-10-10 DIAGNOSIS — F4321 Adjustment disorder with depressed mood: Secondary | ICD-10-CM

## 2020-10-10 NOTE — Progress Notes (Signed)
Virtual Visit via Video Note  I connected with Jorge Stevens on 10/10/20 at  9:00 AM EDT by a video enabled telemedicine application and verified that I am speaking with the correct person using two identifiers.  At orientation to the IOP program, Case Manager discussed the limitations of evaluation and management by telemedicine and the availability of in person appointments. The patient expressed understanding and agreed to proceed with virtual visits throughout the duration of the program.   Location:  Patient: Patient Home Provider: Home Office   History of Present Illness: MDD  Observations/Objective: Check In: Case Manager checked in with all participants to review discharge dates, insurance authorizations, work-related documents and needs from the treatment team regarding medications. Client stated needs and engaged in discussion. Case Manager introduced 1 new Client to the group, with group members welcoming and starting the joining process.  Initial Therapeutic Counselor facilitated a check-in with group members to assess mood and current functioning. Client shared details of their mental health management since our last session, including challenges and successes. Counselor engaged group in discussion, covering the following topics: anger management, self-expression through body art, ADLs impacted by mental health, and maintaining healthy relationships while managing mental health. Client presents with moderate depression and mild anxiety. Client denied any current SI/HI/psychosis.   Second Therapeutic Activity: Counselor introduced [employer], representative with [employer] to share about programming. Group Members asked questions and engaged in discussion, as Jorge Stevens shared about Peer Support, Support Groups and the [employer]. Client stated that they are interested in connecting with the Arts [employer] group, support group and peer support.  Check Out:  Counselor  closed program by allowing time to celebrate 2 graduating group members. Counselor shared reflections on progress and allow space for group members to share well wishes and encouragements with the graduating client. Counselor prompted graduating client to share takeaways, reflect on progress and final thoughts for the group. Client endorsed safety plan to be followed to prevent safety issues.  Assessment and Plan: Clinician recommends that Client remain in IOP treatment to better manage mental health symptoms, stabilization and to address treatment plan goals. Clinician recommends adherence to crisis/safety plan, taking medications as prescribed, and following up with medical professionals if any issues arise.    Follow Up Instructions: Clinician will send Webex link for next session. The Client was advised to call back or seek an in-person evaluation if the symptoms worsen or if the condition fails to improve as anticipated.     I provided 180 minutes of non-face-to-face time during this encounter.     Hilbert Odor, LCSW

## 2020-10-13 ENCOUNTER — Other Ambulatory Visit (HOSPITAL_COMMUNITY): Payer: BC Managed Care – PPO

## 2020-10-13 ENCOUNTER — Other Ambulatory Visit: Payer: Self-pay

## 2020-10-14 ENCOUNTER — Other Ambulatory Visit: Payer: Self-pay

## 2020-10-14 ENCOUNTER — Other Ambulatory Visit (HOSPITAL_COMMUNITY): Payer: BC Managed Care – PPO

## 2020-10-15 ENCOUNTER — Other Ambulatory Visit (HOSPITAL_COMMUNITY): Payer: BC Managed Care – PPO

## 2020-10-15 ENCOUNTER — Other Ambulatory Visit: Payer: Self-pay

## 2020-10-16 ENCOUNTER — Other Ambulatory Visit (HOSPITAL_COMMUNITY): Payer: BC Managed Care – PPO | Attending: Psychiatry | Admitting: Psychiatry

## 2020-10-16 ENCOUNTER — Other Ambulatory Visit: Payer: Self-pay

## 2020-10-16 DIAGNOSIS — F4321 Adjustment disorder with depressed mood: Secondary | ICD-10-CM | POA: Diagnosis not present

## 2020-10-16 DIAGNOSIS — F332 Major depressive disorder, recurrent severe without psychotic features: Secondary | ICD-10-CM | POA: Diagnosis not present

## 2020-10-16 DIAGNOSIS — F331 Major depressive disorder, recurrent, moderate: Secondary | ICD-10-CM | POA: Insufficient documentation

## 2020-10-17 ENCOUNTER — Encounter (HOSPITAL_COMMUNITY): Payer: Self-pay

## 2020-10-17 ENCOUNTER — Other Ambulatory Visit (HOSPITAL_COMMUNITY): Payer: BC Managed Care – PPO | Admitting: Psychiatry

## 2020-10-17 ENCOUNTER — Other Ambulatory Visit: Payer: Self-pay

## 2020-10-17 DIAGNOSIS — F332 Major depressive disorder, recurrent severe without psychotic features: Secondary | ICD-10-CM | POA: Diagnosis not present

## 2020-10-17 DIAGNOSIS — F4321 Adjustment disorder with depressed mood: Secondary | ICD-10-CM | POA: Diagnosis not present

## 2020-10-17 DIAGNOSIS — F331 Major depressive disorder, recurrent, moderate: Secondary | ICD-10-CM

## 2020-10-17 NOTE — Progress Notes (Addendum)
Virtual Visit via Video Note  I connected with GILMORE LIST on 10/16/20 at  9:00 AM EDT by a video enabled telemedicine application and verified that I am speaking with the correct person using two identifiers.  At orientation to the IOP program, Case Manager discussed the limitations of evaluation and management by telemedicine and the availability of in person appointments. The patient expressed understanding and agreed to proceed with virtual visits throughout the duration of the program.   Location:  Patient: Patient Home Provider: Home Office   History of Present Illness: MDD  Observations/Objective: Check In: Case Manager checked in with all participants to review discharge dates, insurance authorizations, work-related documents and needs from the treatment team regarding medications. Client stated needs and engaged in discussion.   Initial Therapeutic Activity: Counselor facilitated a check-in with group members to assess mood and current functioning. Client shared details of their mental health management since our last session, including challenges and successes. Counselor engaged group in discussion, covering the following topics: changing behavior patterns, addressing negative cognitions, self-sabotage, fight, flight or freeze response, specific treatments, history of self-harm, practical application of self-care, anger management, and impact of disordered eating. Client presents with moderate depression and moderate anxiety. Client denied any current SI/HI/psychosis.  Second Therapeutic Activity: Counselor shared psychoeducation about self-compassion practices, application, benefits. Counselor facilitated a Optician, dispensing, prompting Clients to engage in activity by identify what self-touch is most comforting for them, using research by Dr. Haze Rushing, leading researcher on self-compassion. Client and group members shared feedback on experience. Client identified that  hands over heart felt the most soothing.    Check Out: Counselor prompted group members to identify one self-care practice or productivity activity they would like to engage in today. Client plans to reorganize living area. Client endorsed safety plan to be followed to prevent safety issues.  Assessment and Plan: Clinician recommends that Client remain in IOP treatment to better manage mental health symptoms, stabilization and to address treatment plan goals. Clinician recommends adherence to crisis/safety plan, taking medications as prescribed, and following up with medical professionals if any issues arise.    Follow Up Instructions: Clinician will send Webex link for next session. The Client was advised to call back or seek an in-person evaluation if the symptoms worsen or if the condition fails to improve as anticipated.     I provided 180 minutes of non-face-to-face time during this encounter.     Hilbert Odor, LCSW

## 2020-10-17 NOTE — Progress Notes (Signed)
Virtual Visit via Video Note  I connected with Jorge Stevens on 10/17/20 at  9:00 AM EDT by a video enabled telemedicine application and verified that I am speaking with the correct person using two identifiers.  At orientation to the IOP program, Case Manager discussed the limitations of evaluation and management by telemedicine and the availability of in person appointments. The patient expressed understanding and agreed to proceed with virtual visits throughout the duration of the program.   Location:  Patient: Patient Home Provider: Home Office   History of Present Illness: MDD  Observations/Objective: Check In: Case Manager checked in with all participants to review discharge dates, insurance authorizations, work-related documents and needs from the treatment team regarding medications. Client stated needs and engaged in discussion.   Initial Therapeutic Activity: Counselor facilitated a check-in with group members to assess mood and current functioning. Client shared details of their mental health management since our last session, including challenges and successes. Counselor engaged group in discussion, covering the following topics: impact of COVID on MH, caregiving challenges, boundary setting, advocating for others, TMS treatment, and brain imaging. Client presents with moderate depression and moderate anxiety. Client denied any current SI/HI/psychosis.  Second Therapeutic Activity: Counselor engage group in deep breathing techniques, paired with positive affirmations. Group gave positive feedback, noting reduce in stress and tension in body. Counselor provided psychoeducation on the benefits of combatting negative cognitions and compression with these practices.     Check Out: Counselor prompted group members to identify one self-care practice or productivity activity they would like to engage in today. Client plans to spend time with family. Client endorsed safety plan to be  followed to prevent safety issues.  Assessment and Plan: Clinician recommends that Client remain in IOP treatment to better manage mental health symptoms, stabilization and to address treatment plan goals. Clinician recommends adherence to crisis/safety plan, taking medications as prescribed, and following up with medical professionals if any issues arise.    Follow Up Instructions: Clinician will send Webex link for next session. The Client was advised to call back or seek an in-person evaluation if the symptoms worsen or if the condition fails to improve as anticipated.     I provided 180 minutes of non-face-to-face time during this encounter.     Hilbert Odor, LCSW

## 2020-10-21 ENCOUNTER — Other Ambulatory Visit (HOSPITAL_COMMUNITY): Payer: BC Managed Care – PPO | Admitting: Psychiatry

## 2020-10-21 ENCOUNTER — Other Ambulatory Visit: Payer: Self-pay

## 2020-10-21 DIAGNOSIS — F331 Major depressive disorder, recurrent, moderate: Secondary | ICD-10-CM

## 2020-10-21 DIAGNOSIS — F332 Major depressive disorder, recurrent severe without psychotic features: Secondary | ICD-10-CM | POA: Diagnosis not present

## 2020-10-21 DIAGNOSIS — F4321 Adjustment disorder with depressed mood: Secondary | ICD-10-CM | POA: Diagnosis not present

## 2020-10-22 ENCOUNTER — Other Ambulatory Visit (HOSPITAL_COMMUNITY): Payer: BC Managed Care – PPO | Admitting: Psychiatry

## 2020-10-22 ENCOUNTER — Encounter (HOSPITAL_COMMUNITY): Payer: Self-pay

## 2020-10-22 ENCOUNTER — Other Ambulatory Visit: Payer: Self-pay

## 2020-10-22 DIAGNOSIS — F331 Major depressive disorder, recurrent, moderate: Secondary | ICD-10-CM | POA: Diagnosis not present

## 2020-10-22 DIAGNOSIS — F332 Major depressive disorder, recurrent severe without psychotic features: Secondary | ICD-10-CM | POA: Diagnosis not present

## 2020-10-22 DIAGNOSIS — F4321 Adjustment disorder with depressed mood: Secondary | ICD-10-CM | POA: Diagnosis not present

## 2020-10-22 NOTE — Progress Notes (Signed)
Virtual Visit via Video Note  I connected with Jorge Stevens on 10/22/20 at  9:00 AM EDT by a video enabled telemedicine application and verified that I am speaking with the correct person using two identifiers.  At orientation to the IOP program, Case Manager discussed the limitations of evaluation and management by telemedicine and the availability of in person appointments. The patient expressed understanding and agreed to proceed with virtual visits throughout the duration of the program.   Location:  Patient: Patient Home Provider: Home Office   History of Present Illness: MDD  Observations/Objective: Check In: Case Manager checked in with all participants to review discharge dates, insurance authorizations, work-related documents and needs from the treatment team regarding medications. Client stated needs and engaged in discussion. Case Manager introduced a new Client, with group members welcoming and starting the joining process.   Initial Therapeutic Activity: Counselor facilitated a check-in with group members to assess mood and current functioning. Client shared details of their mental health management since our last session, including challenges and successes. Counselor engaged group in discussion, covering the following topics: application of skills and current needs. Client presents with moderate depression and moderate anxiety. Client denied any current SI/HI/psychosis.  Second Therapeutic Activity: Counselor introduced our guest speaker, Peggye Fothergill, [employer] Pharmacist, who shared about psychiatric medications, side effects, treatment considerations and how to communicate with medical professionals. Group Members asked questions and shared medication concerns. Counselor prompted group members to reference a worksheet called, "Body Scan" to jot down questions and concerns about their physical health in preparation for their upcoming appointments with medical professionals. Client  needs to get vision checked and find a new provider. Counselor encouraged routine medical check-ups, preparing for appointments, following up with recommendations and seeking specialist if needed.  Check Out: Counselor prompted group members to identify one self-care practice or productivity activity they would like to engage in today. Client plans to get a hair cut and spend time with son. Client endorsed safety plan to be followed to prevent safety issues.  Assessment and Plan: Clinician recommends that Client remain in IOP treatment to better manage mental health symptoms, stabilization and to address treatment plan goals. Clinician recommends adherence to crisis/safety plan, taking medications as prescribed, and following up with medical professionals if any issues arise.    Follow Up Instructions: Clinician will send Webex link for next session. The Client was advised to call back or seek an in-person evaluation if the symptoms worsen or if the condition fails to improve as anticipated.     I provided 180 minutes of non-face-to-face time during this encounter.     Hilbert Odor, LCSW

## 2020-10-22 NOTE — Progress Notes (Signed)
Virtual Visit via Video Note  I connected with Jorge Stevens on 10/21/20 at  9:00 AM EDT by a video enabled telemedicine application and verified that I am speaking with the correct person using two identifiers.  At orientation to the IOP program, Case Manager discussed the limitations of evaluation and management by telemedicine and the availability of in person appointments. The patient expressed understanding and agreed to proceed with virtual visits throughout the duration of the program.   Location:  Patient: Patient Home Provider: Home Office   History of Present Illness: MDD  Observations/Objective: Check In: Case Manager checked in with all participants to review discharge dates, insurance authorizations, work-related documents and needs from the treatment team regarding medications. Client stated needs and engaged in discussion. Case Manager introduced a new Client, with group members welcoming and starting the joining process.   Initial Therapeutic Activity: Counselor facilitated a check-in with group members to assess mood and current functioning. Client shared details of their mental health management since our last session, including challenges and successes. Counselor engaged group in discussion, covering the following topics: sleep disturbances, cognitive coping skills, and gratitude statements. Client presents with moderate depression and moderate anxiety. Client denied any current SI/HI/psychosis.  Second Therapeutic Activity: Counselor continued information and discussion on forgiveness, radical forgiveness, self-forgiveness and healing from past/childhood wounds. Group members shared their experiences and beliefs on these subjects. Counselor reviewed self/homework with group. Group members shared practical applications, identifying people or situation they could forgive.    Check Out: Counselor closed program by allowing time to celebrate a graduating group member. Counselor  shared reflections on progress and allow space for group members to share well wishes and encouragements with the graduating client. Counselor prompted graduating client to share takeaways, reflect on progress and final thoughts for the group. Client endorsed safety plan to be followed to prevent safety issues.  Assessment and Plan: Clinician recommends that Client remain in IOP treatment to better manage mental health symptoms, stabilization and to address treatment plan goals. Clinician recommends adherence to crisis/safety plan, taking medications as prescribed, and following up with medical professionals if any issues arise.    Follow Up Instructions: Clinician will send Webex link for next session. The Client was advised to call back or seek an in-person evaluation if the symptoms worsen or if the condition fails to improve as anticipated.     I provided 180 minutes of non-face-to-face time during this encounter.     Hilbert Odor, LCSW

## 2020-10-23 ENCOUNTER — Other Ambulatory Visit: Payer: Self-pay

## 2020-10-23 ENCOUNTER — Other Ambulatory Visit (HOSPITAL_COMMUNITY): Payer: BC Managed Care – PPO

## 2020-10-23 DIAGNOSIS — F3341 Major depressive disorder, recurrent, in partial remission: Secondary | ICD-10-CM | POA: Diagnosis not present

## 2020-10-24 ENCOUNTER — Other Ambulatory Visit (HOSPITAL_COMMUNITY): Payer: BC Managed Care – PPO | Admitting: Psychiatry

## 2020-10-24 ENCOUNTER — Other Ambulatory Visit: Payer: Self-pay

## 2020-10-27 ENCOUNTER — Other Ambulatory Visit (HOSPITAL_COMMUNITY): Payer: BC Managed Care – PPO

## 2020-10-27 ENCOUNTER — Other Ambulatory Visit: Payer: Self-pay

## 2020-10-28 ENCOUNTER — Other Ambulatory Visit: Payer: Self-pay

## 2020-10-28 ENCOUNTER — Other Ambulatory Visit (HOSPITAL_COMMUNITY): Payer: BC Managed Care – PPO

## 2020-10-29 ENCOUNTER — Other Ambulatory Visit (HOSPITAL_COMMUNITY): Payer: BC Managed Care – PPO

## 2020-10-29 ENCOUNTER — Other Ambulatory Visit: Payer: Self-pay

## 2020-10-30 ENCOUNTER — Other Ambulatory Visit: Payer: Self-pay

## 2020-10-30 ENCOUNTER — Other Ambulatory Visit (HOSPITAL_COMMUNITY): Payer: BC Managed Care – PPO | Admitting: Psychiatry

## 2020-10-30 ENCOUNTER — Encounter (HOSPITAL_COMMUNITY): Payer: Self-pay

## 2020-10-30 DIAGNOSIS — F332 Major depressive disorder, recurrent severe without psychotic features: Secondary | ICD-10-CM | POA: Diagnosis not present

## 2020-10-30 DIAGNOSIS — F4321 Adjustment disorder with depressed mood: Secondary | ICD-10-CM | POA: Diagnosis not present

## 2020-10-30 DIAGNOSIS — F331 Major depressive disorder, recurrent, moderate: Secondary | ICD-10-CM | POA: Diagnosis not present

## 2020-10-30 NOTE — Progress Notes (Signed)
Virtual Visit via Video Note  I connected with Jorge Stevens on 10/30/20 at  9:00 AM EDT by a video enabled telemedicine application and verified that I am speaking with the correct person using two identifiers.  At orientation to the IOP program, Case Manager discussed the limitations of evaluation and management by telemedicine and the availability of in person appointments. The patient expressed understanding and agreed to proceed with virtual visits throughout the duration of the program.   Location:  Patient: Patient Home Provider: Home Office   History of Present Illness: MDD  Observations/Objective: Check In: Case Manager checked in with all participants to review discharge dates, insurance authorizations, work-related documents and needs from the treatment team regarding medications. Client stated needs and engaged in discussion. Case Manager introduced a new Client, with group members welcoming and starting the joining process.   Initial Therapeutic Activity: Counselor facilitated a check-in with group members to assess mood and current functioning. Client shared details of their mental health management since our last session, including challenges and successes. Counselor engaged group in discussion, covering the following topics: life transitions, combatting depression, personal supports, group process and coping skill application. Client presents with moderate depression and moderate anxiety. Client denied any current SI/HI/psychosis.  Second Therapeutic Activity: Counselor introduced [employer], [place] Chaplain to present information and discussion on Grief and Loss. Group members engaged in discussion, sharing how grief impacts them, what comforts them, what emotions are felt, labeling losses, etc. After guest speaker logged off, Counselor prompted group to spend 10-15 minutes journaling to process personal grief and loss situations. Counselor processed entries with group  and client's identified areas for additional processing in individual therapy. Client noted grief related to loss of mother.  Check Out: Counselor prompted group members to identify one self-care practice or productivity activity they would like to engage in today. Client plans to spend time with son preparing for football season. Client endorsed safety plan to be followed to prevent safety issues.  Assessment and Plan: Clinician recommends that Client remain in IOP treatment to better manage mental health symptoms, stabilization and to address treatment plan goals. Clinician recommends adherence to crisis/safety plan, taking medications as prescribed, and following up with medical professionals if any issues arise.    Follow Up Instructions: Clinician will send Webex link for next session. The Client was advised to call back or seek an in-person evaluation if the symptoms worsen or if the condition fails to improve as anticipated.     I provided 180 minutes of non-face-to-face time during this encounter.     Hilbert Odor, LCSW

## 2020-10-31 ENCOUNTER — Other Ambulatory Visit (HOSPITAL_COMMUNITY): Payer: BC Managed Care – PPO | Admitting: Psychiatry

## 2020-10-31 ENCOUNTER — Other Ambulatory Visit: Payer: Self-pay

## 2020-10-31 DIAGNOSIS — F4321 Adjustment disorder with depressed mood: Secondary | ICD-10-CM | POA: Diagnosis not present

## 2020-10-31 DIAGNOSIS — F331 Major depressive disorder, recurrent, moderate: Secondary | ICD-10-CM

## 2020-10-31 DIAGNOSIS — F332 Major depressive disorder, recurrent severe without psychotic features: Secondary | ICD-10-CM | POA: Diagnosis not present

## 2020-11-03 ENCOUNTER — Other Ambulatory Visit (HOSPITAL_COMMUNITY): Payer: BC Managed Care – PPO

## 2020-11-03 ENCOUNTER — Other Ambulatory Visit: Payer: Self-pay

## 2020-11-03 ENCOUNTER — Encounter (HOSPITAL_COMMUNITY): Payer: Self-pay

## 2020-11-03 NOTE — Progress Notes (Signed)
Virtual Visit via Video Note  I connected with Jorge Stevens on 11/03/20 at  9:00 AM EDT by a video enabled telemedicine application and verified that I am speaking with the correct person using two identifiers.  At orientation to the IOP program, Case Manager discussed the limitations of evaluation and management by telemedicine and the availability of in person appointments. The patient expressed understanding and agreed to proceed with virtual visits throughout the duration of the program.   Location:  Patient: Patient Home Provider: Home Office   History of Present Illness: MDD  Observations/Objective: Check In: Case Manager checked in with all participants to review discharge dates, insurance authorizations, work-related documents and needs from the treatment team regarding medications. Client stated needs and engaged in discussion.   Initial Therapeutic Activity: Counselor facilitated a check-in with group members to assess mood and current functioning. Client shared details of their mental health management since our last session, including challenges and successes. Counselor engaged group in discussion, covering the following topics: life transitions, combatting depression, personal supports, group process and coping skill application. Client presents with moderate depression and moderate anxiety. Client denied any current SI/HI/psychosis.  Second Therapeutic Activity: Counselor engaged group in a [employer], where group members and Counselor shared a variety of community and therapeutic resources. Counselor to send exhaustive list to group for future reference. Counselor introduced [employer], representative with [employer] to share about programming. Group Members asked questions and engaged in discussion, as [name] shared about Peer Support, Support Groups and the [employer]. Client stated that they are interested in connecting with the peer support program.  Check  Out: Counselor prompted group members to identify one self-care practice or productivity activity they would like to engage in this weekend. Client plans to exercise and socialize. Client endorsed safety plan to be followed to prevent safety issues.  Assessment and Plan: Clinician recommends that Client remain in IOP treatment to better manage mental health symptoms, stabilization and to address treatment plan goals. Clinician recommends adherence to crisis/safety plan, taking medications as prescribed, and following up with medical professionals if any issues arise.    Follow Up Instructions: Clinician will send Webex link for next session. The Client was advised to call back or seek an in-person evaluation if the symptoms worsen or if the condition fails to improve as anticipated.     I provided 180 minutes of non-face-to-face time during this encounter.     Hilbert Odor, LCSW

## 2020-11-04 ENCOUNTER — Other Ambulatory Visit: Payer: Self-pay

## 2020-11-04 ENCOUNTER — Other Ambulatory Visit (HOSPITAL_COMMUNITY): Payer: BC Managed Care – PPO

## 2020-11-05 ENCOUNTER — Other Ambulatory Visit: Payer: Self-pay

## 2020-11-05 ENCOUNTER — Other Ambulatory Visit (HOSPITAL_COMMUNITY): Payer: BC Managed Care – PPO

## 2020-11-06 ENCOUNTER — Other Ambulatory Visit (HOSPITAL_COMMUNITY): Payer: BC Managed Care – PPO

## 2020-11-06 ENCOUNTER — Other Ambulatory Visit: Payer: Self-pay

## 2020-11-06 NOTE — Telephone Encounter (Signed)
Pt requested copy of 2022 and 2021 FMLA paperwork  Copies up front for pt pick up

## 2020-11-07 ENCOUNTER — Other Ambulatory Visit: Payer: Self-pay

## 2020-11-07 ENCOUNTER — Other Ambulatory Visit (HOSPITAL_COMMUNITY): Payer: BC Managed Care – PPO | Admitting: Psychiatry

## 2020-11-07 ENCOUNTER — Encounter (HOSPITAL_COMMUNITY): Payer: Self-pay | Admitting: Family

## 2020-11-07 DIAGNOSIS — F331 Major depressive disorder, recurrent, moderate: Secondary | ICD-10-CM

## 2020-11-07 NOTE — Patient Instructions (Signed)
D:  Patient completed MH-IOP today.  A:  Discharge today.  Follow up with Dr. Evelene Croon and [name], LCSW (pt states he will call for appts).  Strongly recommended support groups through [facility].  RTW on 11-10-20 as per Dr. Evelene Croon.  R:  Patient receptive.

## 2020-11-07 NOTE — Progress Notes (Signed)
  [facility] Intensive Outpatient Program Discharge Summary  Jorge Stevens [id number]  Admission date: 10/06/2020 Discharge date: 11/07/2020  Reason for admission: Per admission assessment note: Jorge Stevens 35 year old African-American male presents after referral by his primary psychiatrist.  He reports worsening depression, mood irritability and anxiety.  Reports experiencing grief and loss related to the stillbirth of his child.  Reports ongoing ruminations,  poor concentration and sadness.  Reports he is unable to function well at work as he has been employed at [employer] [place] for the past 5 years.  States when I had children crying in the background and get distracted irritable and depressed.  Reports his mother passed 2 months ago.  States very close relationship between he and his mother.Seith denied previous inpatient admissions.  Denies suicidal or homicidal ideations.  Denies auditory or visual hallucinations.  Denies any illicit drug use substance abuse history.  Does report drinking alcohol occasionally.  Jorge Stevens reported that he is currently prescribed xanax 1 mg , adderall 30 mg and Pamelor 50 mg. He reported taken and tolerating medications well. Denied any medications side effects. Patient to start intensive outpatient program on 10/06/2020   Progress in Program Toward Treatment Goals: [place] attended and participated with daily group session with active and engaged participation. Staff reported multiple missed excuse absences due to declining health of his significant other.  This NP did not follow-up with patient at discharge however was reported by staff  that patient denied suicidal or homicidal ideations.  Was reported  that Alec was requesting additional time and IOP, due to upcoming anniversary of the passing of his child. Patient to  follow-up with attending psychiatrist and keep follow up with additional outpatient resources.   Progress (rationale):  Patient to keep follow-up appointment with psychiatrist Rupinder Lafayette Dragon and therapist [name], LCSW  Take all medications as prescribed. Keep all follow-up appointments as scheduled.  Do not consume alcohol or use illegal drugs while on prescription medications. Report any adverse effects from your medications to your primary care provider promptly.  In the event of recurrent symptoms or worsening symptoms, call 911, a crisis hotline, or go to the nearest emergency department for evaluation.    Oneta Rack, NP 11/07/2020

## 2020-11-07 NOTE — Progress Notes (Signed)
Virtual Visit via Video Note  I connected with Jorge Stevens on @TODAY @ at  9:00 AM EDT by a video enabled telemedicine application and verified that I am speaking with the correct person using two identifiers.  Location: Patient: at home Provider: at home office   I discussed the limitations of evaluation and management by telemedicine and the availability of in person appointments. The patient expressed understanding and agreed to proceed.  I discussed the assessment and treatment plan with the patient. The patient was provided an opportunity to ask questions and all were answered. The patient agreed with the plan and demonstrated an understanding of the instructions.   The patient was advised to call back or seek an in-person evaluation if the symptoms worsen or if the condition fails to improve as anticipated.  I provided 20 minutes of non-face-to-face time during this encounter.   , RITA, M.Ed, CNA   Patient ID: Jorge Stevens, male   DOB: 1985/09/06, 35 y.o.   MRN: [id number] As per previous CCA states:  This is a 35 yr old married, employed, 31 American male who was referred per Dr. [place]; treatment for worsening depressive/anxiety symptoms.  Stressors:  1) unresolved grief/loss issue:  Mother passed 07-21-20 and wife had a still born birth Aug. 2020.  States that the day she had a still born; her blood sugar reached 980 and she almost died herself.  According to pt, since mother's funeral; he and his two brothers haven't spoken d/t them not paying their half of the funeral costs.  2) Job ([employer]) of five yrs.  Pt has been out of work since May 2022.  Pt works within June 2022; and states he hasn't been able to function on the job whenever he was working. 3) Chronic Left Shoulder Pain.  Pt denies any previous psych admits or suicide attempts or gestures.  Has been seeing Dr. [employer] and Janace Hoard, LCSW for 12 yrs total.  Family hx:   Deceased mother (depression).  Pt denies SI/HI or A/V hallucinatiions.  Pt completed 15 days of MH-IOP.  Reports he wishes he could attend longer, but his return to work date is 11-10-20.  "I called Dr. 04-04-2005 office to ask if I could be extended one more week, but I was told that she wasn't going to write me out of work any longer." Pt has missed 11 days d/t him being ill at one time and being the caretaker for his wife.  States that his depression and anxiety has worsened.  Continues to be struggling with grief/loss issues.  The anniversary of daughter's death is coming up this 2022/07/21.  According to pt, they are planning to have a balloon release in her remembrance.  On a scale of 1-10 (10 being the worst) pt rates his depression and anxiety at a 9.  Denies SI/HI or A/V hallucinations.  States that the groups were very helpful.  A:  D/C today.  F/U with Dr. Thursday and Evelene Croon, LCSW (pt to schedule appts with both).  RTW on 11-10-20; without any restrictions.  Strongly recommended grief counseling and groups through [facility].  R:  Pt receptive.  [facility], M.Ed.CNA

## 2020-11-28 ENCOUNTER — Encounter: Payer: Self-pay | Admitting: Family Medicine

## 2020-12-03 ENCOUNTER — Encounter: Payer: Self-pay | Admitting: Pulmonary Disease

## 2020-12-03 ENCOUNTER — Ambulatory Visit (INDEPENDENT_AMBULATORY_CARE_PROVIDER_SITE_OTHER): Payer: BC Managed Care – PPO | Admitting: Pulmonary Disease

## 2020-12-03 ENCOUNTER — Other Ambulatory Visit: Payer: Self-pay

## 2020-12-03 VITALS — BP 134/78 | HR 74 | Temp 98.6°F | Ht 73.0 in | Wt 319.0 lb

## 2020-12-03 DIAGNOSIS — G4733 Obstructive sleep apnea (adult) (pediatric): Secondary | ICD-10-CM | POA: Diagnosis not present

## 2020-12-03 DIAGNOSIS — Z9989 Dependence on other enabling machines and devices: Secondary | ICD-10-CM

## 2020-12-03 MED ORDER — MELATONIN 10 MG PO TABS: 5.0000 mg | ORAL_TABLET | ORAL | 0 refills | Status: AC | PRN

## 2020-12-03 NOTE — Patient Instructions (Signed)
  Change to autoCPAP settings 15-20 cm EPR level 3 Trial of large aifit F30 full face mask  Make appt with sleep center [phone] for mask fitting session OK to trial melatonin 5-10 mg 2h before bedtime  Report back in 2-4 weeks  Letter for work

## 2020-12-03 NOTE — Assessment & Plan Note (Signed)
CPAP download was reviewed which shows residual AHI of 11/hour, on certain nights as high as 20/hour , on CPAP of 15 cm with large leak. Average compliance is about 6 hours per night, no missed nights.  I offered Jorge Stevens CPAP titration visit but he would like to avoid Change to autoCPAP settings 15-20 cm EPR level 3 Trial of large aifit F30 full face mask  Make appt with sleep center [phone] for mask fitting session OK to trial melatonin 5-10 mg 2h before bedtime  Report back in 2-4 weeks I gave Jorge Stevens a letter for work

## 2020-12-03 NOTE — Progress Notes (Signed)
   Subjective:    Patient ID: Jorge Stevens, male    DOB: Jan 17, 1986, 35 y.o.   MRN: [id number]  HPI  35 yo morbidly obese  man with obstructive sleep apnea on CPAP 15cm  PMH : tonsillectomy 2008 , anxiety  21-month follow-up, accompanied by his wife. He is maintained on CPAP of 15 cm.  There are times he feels like he is underwater and he cannot catch his breath and wakes up gasping.  Wife has noted snoring even while he is on the CPAP.  This happens about 2-3 times a week He feels tired during the day. He also has longstanding insomnia Wife feels that these episodes are related to anxiety and she gives him his Xanax and this seems to relieve his symptoms.  He was placed on nortriptyline by psychiatry but does not like to take too much medications. He works from home for [employer] [place] and would like a Physicist, medical for work   Significant tests/ events reviewed  PSG >> mod obstructive sleep apnea >> started CPAP 10 cm, medium quattro mask, goal 12 cm   Past Medical History:  Diagnosis Date   Anxiety    Morbid obesity with BMI of 45.0-49.9, adult (HCC)    Other alteration of consciousness    Other specified personal history presenting hazards to health(V15.89)    Shoulder impingement syndrome, left    Unspecified sleep apnea      Review of Systems neg for any significant sore throat, dysphagia, itching, sneezing, nasal congestion or excess/ purulent secretions, fever, chills, sweats, unintended wt loss, pleuritic or exertional cp, hempoptysis, orthopnea pnd or change in chronic leg swelling. Also denies presyncope, palpitations, heartburn, abdominal pain, nausea, vomiting, diarrhea or change in bowel or urinary habits, dysuria,hematuria, rash, arthralgias, visual complaints, headache, numbness weakness or ataxia.     Objective:   Physical Exam  Gen. Pleasant, obese, in no distress ENT - no lesions, no post nasal drip Neck: No JVD, no thyromegaly, no carotid bruits Lungs: no  use of accessory muscles, no dullness to percussion, decreased without rales or rhonchi  Cardiovascular: Rhythm regular, heart sounds  normal, no murmurs or gallops, no peripheral edema Musculoskeletal: No deformities, no cyanosis or clubbing , no tremors       Assessment & Plan:

## 2020-12-03 NOTE — Assessment & Plan Note (Signed)
Weight loss encouraged He has gained 10 pounds since his last visit although he states that he has lost from a peak of 332 his current weight of 320 pounds.

## 2020-12-11 DIAGNOSIS — G4733 Obstructive sleep apnea (adult) (pediatric): Secondary | ICD-10-CM | POA: Diagnosis not present

## 2020-12-15 DIAGNOSIS — M9903 Segmental and somatic dysfunction of lumbar region: Secondary | ICD-10-CM | POA: Diagnosis not present

## 2020-12-15 DIAGNOSIS — M9902 Segmental and somatic dysfunction of thoracic region: Secondary | ICD-10-CM | POA: Diagnosis not present

## 2020-12-15 DIAGNOSIS — M62838 Other muscle spasm: Secondary | ICD-10-CM | POA: Diagnosis not present

## 2020-12-15 DIAGNOSIS — M5415 Radiculopathy, thoracolumbar region: Secondary | ICD-10-CM | POA: Diagnosis not present

## 2020-12-22 ENCOUNTER — Other Ambulatory Visit: Payer: Self-pay | Admitting: Orthopedic Surgery

## 2020-12-23 ENCOUNTER — Other Ambulatory Visit (HOSPITAL_BASED_OUTPATIENT_CLINIC_OR_DEPARTMENT_OTHER): Payer: BC Managed Care – PPO | Admitting: Pulmonary Disease

## 2020-12-24 ENCOUNTER — Telehealth: Payer: Self-pay | Admitting: Family Medicine

## 2020-12-24 NOTE — Telephone Encounter (Signed)
Pt called in stating that medical paperwork that was faxed over to HR was to dark to see . Pt wants to know if they can be refaxed

## 2020-12-24 NOTE — Telephone Encounter (Signed)
Sent mychart letting pt know we couldn't get forms any lighter and he would need to update the original forms or fax them to [place]

## 2020-12-29 NOTE — Telephone Encounter (Signed)
Disregard prev message, 1st 2 pages were duplicate copies of forms PCP has filled out, form faxed to worker and sent mychart letting pt know and copy sent to scanning and copy mailed to pt

## 2020-12-29 NOTE — Telephone Encounter (Signed)
PCP filled out 1st 2 pages but there is 2 additional pages that need to be completed. Please review before I fax form. Placed in your inbox

## 2020-12-30 ENCOUNTER — Ambulatory Visit (HOSPITAL_BASED_OUTPATIENT_CLINIC_OR_DEPARTMENT_OTHER): Payer: BC Managed Care – PPO | Attending: Pulmonary Disease | Admitting: Pulmonary Disease

## 2021-01-08 ENCOUNTER — Ambulatory Visit: Payer: Self-pay

## 2021-01-08 ENCOUNTER — Ambulatory Visit (INDEPENDENT_AMBULATORY_CARE_PROVIDER_SITE_OTHER): Payer: BC Managed Care – PPO | Admitting: Orthopedic Surgery

## 2021-01-08 ENCOUNTER — Other Ambulatory Visit: Payer: Self-pay

## 2021-01-08 DIAGNOSIS — M542 Cervicalgia: Secondary | ICD-10-CM | POA: Diagnosis not present

## 2021-01-08 MED ORDER — GABAPENTIN 300 MG PO CAPS: 300.0000 mg | ORAL_CAPSULE | Freq: Three times a day (TID) | ORAL | 3 refills | Status: DC

## 2021-01-08 MED ORDER — PREDNISONE 10 MG PO TABS: 20.0000 mg | ORAL_TABLET | Freq: Every day | ORAL | 0 refills | Status: DC

## 2021-01-08 NOTE — Progress Notes (Signed)
Office Visit Note   Patient: Jorge Stevens           Date of Birth: Apr 09, 1985           MRN: [id number] Visit Date: 01/08/2021              Requested by: Tower, Audrie Gallus, MD [address] [place],  [place] [address] PCP: Judy Pimple, MD  Chief Complaint  Patient presents with   Right Shoulder - Pain   Left Shoulder - Pain      HPI: Patient is a 35 year old gentleman who presents with a 3 to 51-month history of pain from his cervical spine across the clavicles bilaterally he states the pain is daily patient states that he currently works from home but cannot work due to the inability to sit up for his prolonged periods of time.  Patient also complains of posterior neck pain with radiating to the medial scapular borders.  Patient has tried narcotic pain medicine as well as Robaxin and Flexeril without relief.  Patient states that the bones hurt feels like burning inside the bones.  He states he feels like he has lightening bolts that go down from the shoulder to the elbow and fingers.  Patient has tried water therapy and exercise without relief.  Patient states that he recently has been in a pain clinic at [facility] and they gave permission for his primary care physician to prescribe narcotics. Patient states that he is in too much pain and his wife explains his symptoms.  Patient is currently on chronic Vicodin.  Assessment & Plan: Visit Diagnoses:  1. Neck pain     Plan: Prescriptions were sent in for Neurontin and prednisone.  Patient will follow-up in 3 weeks.    We may need an MRI scan of the cervical spine.  Follow-Up Instructions: No follow-ups on file.   Ortho Exam  Patient is alert, oriented, no adenopathy, well-dressed, normal affect, normal respiratory effort. Examination patient has no focal motor weakness in either upper extremity.  He has radicular pain anteriorly over the clavicles and posteriorly over the medial scapular border.  Imaging: No results  found. No images are attached to the encounter.  Labs: Lab Results  Component Value Date   HGBA1C 5.7 05/02/2018   HGBA1C 5.7 04/06/2016   HGBA1C 5.7 03/27/2015     Lab Results  Component Value Date   ALBUMIN 4.4 05/02/2018   ALBUMIN 3.9 04/06/2016   ALBUMIN 4.4 03/27/2015    No results found for: MG No results found for: VD25OH  No results found for: PREALBUMIN CBC EXTENDED Latest Ref Rng & Units 05/02/2018 06/29/2017 04/06/2016  WBC 4.0 - 10.5 K/uL 5.8 6.4 5.9  RBC 4.22 - 5.81 Mil/uL 5.29 5.31 5.02  HGB 13.0 - 17.0 g/dL 58.5 27.7 82.4  HCT 23.5 - 52.0 % 45.3 46.0 41.7  PLT 150.0 - 400.0 K/uL 470.0(H) 401(H) 438.0(H)  NEUTROABS 1.4 - 7.7 K/uL 3.2 - 2.7  LYMPHSABS 0.7 - 4.0 K/uL 2.0 - 2.5     There is no height or weight on file to calculate BMI.  Orders:  Orders Placed This Encounter  Procedures   XR Cervical Spine 2 or 3 views   Meds ordered this encounter  Medications   gabapentin (NEURONTIN) 300 MG capsule    Sig: Take 1 capsule (300 mg total) by mouth 3 (three) times daily. 3 times a day when necessary neuropathy pain    Dispense:  90 capsule  Refill:  3   predniSONE (DELTASONE) 10 MG tablet    Sig: Take 2 tablets (20 mg total) by mouth daily with breakfast.    Dispense:  60 tablet    Refill:  0     Procedures: No procedures performed  Clinical Data: No additional findings.  ROS:  All other systems negative, except as noted in the HPI. Review of Systems  Objective: Vital Signs: There were no vitals taken for this visit.  Specialty Comments:  No specialty comments available.  PMFS History: Patient Active Problem List   Diagnosis Date Noted   Grief reaction 11/24/2018   Diarrhea 10/08/2018   Varicose veins of both lower extremities 01/17/2018   Low back pain 01/17/2018   Rotator cuff tear arthropathy, left 07/06/2017   Impingement syndrome of left shoulder    Pre-diabetes 03/23/2015   Chronic bursitis of left shoulder 04/23/2014    Hyperlipidemia 09/26/2012   Hypertension, essential 12/16/2010   Routine general medical examination at a health care facility 12/07/2010   Anal fissure 08/13/2010   OSA on CPAP 12/01/2008   WISDOM TEETH EXTRACTION, HX OF 11/26/2008   Morbid obesity (HCC) 07/28/2006   Past Medical History:  Diagnosis Date   Anxiety    Morbid obesity with BMI of 45.0-49.9, adult (HCC)    Other alteration of consciousness    Other specified personal history presenting hazards to health(V15.89)    Shoulder impingement syndrome, left    Unspecified sleep apnea     Family History  Problem Relation Age of Onset   Depression Mother    Hyperlipidemia Mother    Early death Mother    Stroke Mother    Hyperlipidemia Father    Diabetes Other        grandparents   Throat cancer Other        grandmother   Lung cancer Other        grandfather    Past Surgical History:  Procedure Laterality Date   SHOULDER ARTHROSCOPY Left 07/06/2017   Procedure: LEFT SHOULDER ARTHROSCOPY, DEBRIDEMENT, AND DECOMPRESSION;  Surgeon: Nadara Mustard, MD;  Location: [facility] OR;  Service: Orthopedics;  Laterality: Left;   SHOULDER ARTHROSCOPY W/ SUBACROMIAL DECOMPRESSION AND DISTAL CLAVICLE EXCISION Left    Dr. Kristeen Miss   TONSILLECTOMY     WISDOM TOOTH EXTRACTION     Social History   Occupational History   Occupation: [employer]  Tobacco Use   Smoking status: Former    Packs/day: 1.50    Years: 6.00    Pack years: 9.00    Types: Cigarettes    Quit date: 03/16/2007    Years since quitting: 13.8   Smokeless tobacco: Never  Vaping Use   Vaping Use: Former  Substance and Sexual Activity   Alcohol use: Yes    Alcohol/week: 0.0 standard drinks    Comment: Occasional   Drug use: No   Sexual activity: Not on file

## 2021-01-12 ENCOUNTER — Encounter: Payer: Self-pay | Admitting: Orthopedic Surgery

## 2021-01-23 ENCOUNTER — Other Ambulatory Visit: Payer: Self-pay | Admitting: Orthopedic Surgery

## 2021-01-23 NOTE — Telephone Encounter (Signed)
Please advise 

## 2021-01-28 ENCOUNTER — Other Ambulatory Visit: Payer: Self-pay

## 2021-01-28 ENCOUNTER — Telehealth (INDEPENDENT_AMBULATORY_CARE_PROVIDER_SITE_OTHER): Payer: BC Managed Care – PPO | Admitting: Family Medicine

## 2021-01-28 ENCOUNTER — Encounter: Payer: Self-pay | Admitting: Family Medicine

## 2021-01-28 DIAGNOSIS — J019 Acute sinusitis, unspecified: Secondary | ICD-10-CM

## 2021-01-28 MED ORDER — PREDNISONE 10 MG PO TABS: ORAL_TABLET | ORAL | 0 refills | Status: DC

## 2021-01-28 MED ORDER — AMOXICILLIN-POT CLAVULANATE 875-125 MG PO TABS: 1.0000 | ORAL_TABLET | Freq: Two times a day (BID) | ORAL | 0 refills | Status: DC

## 2021-01-28 NOTE — Assessment & Plan Note (Signed)
Over 7 d after viral uri Sinus/ear pain and ETD  On flonase Px augmentin  Also prednisone taper 30 mg  Fluids/rest nsaid prn  Saline or steam prn Update if not starting to improve in a week or if worsening  ER precautions discussed

## 2021-01-28 NOTE — Patient Instructions (Signed)
Drink fluids  It can help to breathe steam from the shower or use a vaporizer   Take augmentin for sinus infection  Take prednisone for the congestion  Get some nasal saline spray if you can   Update if not starting to improve in a week or if worsening   If any severe symptoms or you have difficulty breathing please alert [place] and get to the ER

## 2021-01-28 NOTE — Progress Notes (Signed)
Virtual Visit via Video Note  I connected with Jorge Stevens on 01/28/21 at  4:00 PM EST by a video enabled telemedicine application and verified that I am speaking with the correct person using two identifiers.  Location: Patient: home Provider: office   I discussed the limitations of evaluation and management by telemedicine and the availability of in person appointments. The patient expressed understanding and agreed to proceed.  Parties involved in encounter  Patient: Jorge Stevens    Provider:  Roxy Manns MD   History of Present Illness: Thinks he has a sinus infection   Started over a week ago with uri symptoms   Nasal congestion  Cannot get much out /nasal passages are swollen  Mucous is green and thick  Hard to breathe with cpap No bleeding  Ears are full  Sounds like underwater  Facial pain below eyes and between brows   Prod cough-phlegm  Rattling  No wheezing or sob   No fever  Covid test is negative- 4 of them   Whole house has uri  Patient Active Problem List   Diagnosis Date Noted   Grief reaction 11/24/2018   Diarrhea 10/08/2018   Varicose veins of both lower extremities 01/17/2018   Low back pain 01/17/2018   Rotator cuff tear arthropathy, left 07/06/2017   Impingement syndrome of left shoulder    Acute sinusitis 01/04/2017   Pre-diabetes 03/23/2015   Chronic bursitis of left shoulder 04/23/2014   Hyperlipidemia 09/26/2012   Hypertension, essential 12/16/2010   Routine general medical examination at a health care facility 12/07/2010   Anal fissure 08/13/2010   OSA on CPAP 12/01/2008   WISDOM TEETH EXTRACTION, HX OF 11/26/2008   Morbid obesity (HCC) 07/28/2006   Past Medical History:  Diagnosis Date   Anxiety    Morbid obesity with BMI of 45.0-49.9, adult (HCC)    Other alteration of consciousness    Other specified personal history presenting hazards to health(V15.89)    Shoulder impingement syndrome, left    Unspecified  sleep apnea    Past Surgical History:  Procedure Laterality Date   SHOULDER ARTHROSCOPY Left 07/06/2017   Procedure: LEFT SHOULDER ARTHROSCOPY, DEBRIDEMENT, AND DECOMPRESSION;  Surgeon: Nadara Mustard, MD;  Location: [facility] OR;  Service: Orthopedics;  Laterality: Left;   SHOULDER ARTHROSCOPY W/ SUBACROMIAL DECOMPRESSION AND DISTAL CLAVICLE EXCISION Left    Dr. Kristeen Miss   TONSILLECTOMY     WISDOM TOOTH EXTRACTION     Social History   Tobacco Use   Smoking status: Former    Packs/day: 1.50    Years: 6.00    Pack years: 9.00    Types: Cigarettes    Quit date: 03/16/2007    Years since quitting: 13.8   Smokeless tobacco: Never  Vaping Use   Vaping Use: Former  Substance Use Topics   Alcohol use: Yes    Alcohol/week: 0.0 standard drinks    Comment: Occasional   Drug use: No   Family History  Problem Relation Age of Onset   Depression Mother    Hyperlipidemia Mother    Early death Mother    Stroke Mother    Hyperlipidemia Father    Diabetes Other        grandparents   Throat cancer Other        grandmother   Lung cancer Other        grandfather   Allergies  Allergen Reactions   Robaxin [Methocarbamol]     Sleepy    Ambien [Zolpidem  Tartrate] Itching   Current Outpatient Medications on File Prior to Visit  Medication Sig Dispense Refill   ALPRAZolam (XANAX) 1 MG tablet Take 1 mg by mouth 3 (three) times daily.     amphetamine-dextroamphetamine (ADDERALL) 30 MG tablet Take 30 mg by mouth daily.     cloNIDine (CATAPRES) 0.1 MG tablet Take 0.1 mg by mouth daily. 1-3 tabs at QHS     fluticasone (FLONASE) 50 MCG/ACT nasal spray Place 2 sprays into both nostrils daily as needed for allergies. 16 g 11   gabapentin (NEURONTIN) 300 MG capsule Take 1 capsule (300 mg total) by mouth 3 (three) times daily. 3 times a day when necessary neuropathy pain 90 capsule 3   ibuprofen (ADVIL) 800 MG tablet TAKE 1 TABLET BY MOUTH EVERY 8 HOURS AS NEEDED 60 tablet 1   Melatonin 10 MG TABS Take  5 mg by mouth as needed (2 hours before sleep). 30 tablet 0   NON FORMULARY CPAP 12 cm, full face mask     nortriptyline (PAMELOR) 50 MG capsule Take 200 mg by mouth at bedtime.     predniSONE (DELTASONE) 10 MG tablet Take 2 tablets (20 mg total) by mouth daily with breakfast. 60 tablet 0   Soft Lens Products (REWETTING DROPS) SOLN Apply 1 drop to eye daily as needed (for dry lenses/lubricant eyes.).     No current facility-administered medications on file prior to visit.   Review of Systems  Constitutional:  Positive for malaise/fatigue. Negative for chills and fever.  HENT:  Positive for congestion and sinus pain. Negative for ear pain and sore throat.   Eyes:  Negative for blurred vision, discharge and redness.  Respiratory:  Positive for cough and sputum production. Negative for shortness of breath and stridor.   Cardiovascular:  Negative for chest pain, palpitations and leg swelling.  Gastrointestinal:  Negative for abdominal pain, diarrhea, nausea and vomiting.  Musculoskeletal:  Negative for myalgias.  Skin:  Negative for rash.  Neurological:  Positive for headaches. Negative for dizziness.     Observations/Objective: Patient appears well, in no distress Weight is baseline  No facial swelling or asymmetry Tender over maxillary sinuses to self palpation Normal voice-not hoarse and no slurred speech No obvious tremor or mobility impairment Moving neck and UEs normally Able to hear the call well  No wheeze or shortness of breath during interview  Occ cough/clears throat  Talkative and mentally sharp with no cognitive changes No skin changes on face or neck , no rash or pallor Affect is normal    Assessment and Plan: Problem List Items Addressed This Visit       Respiratory   Acute sinusitis    Over 7 d after viral uri Sinus/ear pain and ETD  On flonase Px augmentin  Also prednisone taper 30 mg  Fluids/rest nsaid prn  Saline or steam prn Update if not starting to  improve in a week or if worsening  ER precautions discussed      Relevant Medications   amoxicillin-clavulanate (AUGMENTIN) 875-125 MG tablet   predniSONE (DELTASONE) 10 MG tablet     Follow Up Instructions: Drink fluids  It can help to breathe steam from the shower or use a vaporizer   Take augmentin for sinus infection  Take prednisone for the congestion  Get some nasal saline spray if you can   Update if not starting to improve in a week or if worsening   If any severe symptoms or you have difficulty breathing please  alert [place] and get to the ER   I discussed the assessment and treatment plan with the patient. The patient was provided an opportunity to ask questions and all were answered. The patient agreed with the plan and demonstrated an understanding of the instructions.   The patient was advised to call back or seek an in-person evaluation if the symptoms worsen or if the condition fails to improve as anticipated.     Roxy Manns, MD

## 2021-01-29 ENCOUNTER — Ambulatory Visit: Payer: BC Managed Care – PPO | Admitting: Orthopedic Surgery

## 2021-02-04 ENCOUNTER — Other Ambulatory Visit: Payer: Self-pay | Admitting: Orthopedic Surgery

## 2021-03-11 DIAGNOSIS — G4733 Obstructive sleep apnea (adult) (pediatric): Secondary | ICD-10-CM | POA: Diagnosis not present

## 2021-04-11 DIAGNOSIS — G4733 Obstructive sleep apnea (adult) (pediatric): Secondary | ICD-10-CM | POA: Diagnosis not present

## 2021-05-12 DIAGNOSIS — G4733 Obstructive sleep apnea (adult) (pediatric): Secondary | ICD-10-CM | POA: Diagnosis not present

## 2021-05-27 DIAGNOSIS — F411 Generalized anxiety disorder: Secondary | ICD-10-CM | POA: Diagnosis not present

## 2021-05-27 DIAGNOSIS — F5101 Primary insomnia: Secondary | ICD-10-CM | POA: Diagnosis not present

## 2021-05-27 DIAGNOSIS — F900 Attention-deficit hyperactivity disorder, predominantly inattentive type: Secondary | ICD-10-CM | POA: Diagnosis not present

## 2021-05-27 DIAGNOSIS — F3342 Major depressive disorder, recurrent, in full remission: Secondary | ICD-10-CM | POA: Diagnosis not present

## 2021-06-10 ENCOUNTER — Telehealth: Payer: Self-pay | Admitting: *Deleted

## 2021-06-10 DIAGNOSIS — Z8619 Personal history of other infectious and parasitic diseases: Secondary | ICD-10-CM

## 2021-06-10 MED ORDER — KETOCONAZOLE 2 % EX CREA: 1.0000 "application " | TOPICAL_CREAM | Freq: Every day | CUTANEOUS | 1 refills | Status: DC

## 2021-06-10 NOTE — Telephone Encounter (Signed)
Pt notified Rx sent to pharmacy and advised of Dr. Royden Purl recommendations and to f/u if no improvement  ?

## 2021-06-10 NOTE — Telephone Encounter (Signed)
Keep area clean and dry ?Boxer briefs are best  ?Try the ketoconazole and f/u if not improved ?

## 2021-06-10 NOTE — Telephone Encounter (Signed)
Pt said he has a yeast inf rash in his groin area it's red and itchy and he has had one in the past and it looks the same. Pt is asking if PCP could send him in a med to help with the rash. He wanted to see PCP only for this issue but no appts available until next week. Pt is asking if PCP can send in med so he doesn't have to see another provider he doesn't know.  ? ?[facility] ?

## 2021-06-11 ENCOUNTER — Ambulatory Visit: Payer: BC Managed Care – PPO | Admitting: Family

## 2021-07-02 DIAGNOSIS — F5101 Primary insomnia: Secondary | ICD-10-CM | POA: Diagnosis not present

## 2021-07-02 DIAGNOSIS — F4322 Adjustment disorder with anxiety: Secondary | ICD-10-CM | POA: Diagnosis not present

## 2021-07-02 DIAGNOSIS — F4321 Adjustment disorder with depressed mood: Secondary | ICD-10-CM | POA: Diagnosis not present

## 2021-07-02 DIAGNOSIS — F900 Attention-deficit hyperactivity disorder, predominantly inattentive type: Secondary | ICD-10-CM | POA: Diagnosis not present

## 2021-07-07 DIAGNOSIS — G4733 Obstructive sleep apnea (adult) (pediatric): Secondary | ICD-10-CM | POA: Diagnosis not present

## 2021-07-31 DIAGNOSIS — F900 Attention-deficit hyperactivity disorder, predominantly inattentive type: Secondary | ICD-10-CM | POA: Diagnosis not present

## 2021-07-31 DIAGNOSIS — F331 Major depressive disorder, recurrent, moderate: Secondary | ICD-10-CM | POA: Diagnosis not present

## 2021-07-31 DIAGNOSIS — F4322 Adjustment disorder with anxiety: Secondary | ICD-10-CM | POA: Diagnosis not present

## 2021-07-31 DIAGNOSIS — F5101 Primary insomnia: Secondary | ICD-10-CM | POA: Diagnosis not present

## 2021-08-06 DIAGNOSIS — G4733 Obstructive sleep apnea (adult) (pediatric): Secondary | ICD-10-CM | POA: Diagnosis not present

## 2021-08-18 ENCOUNTER — Telehealth: Payer: Self-pay | Admitting: Orthopedic Surgery

## 2021-08-18 NOTE — Telephone Encounter (Signed)
Patient called asked if he can see another provider sooner for shoulder pain? The  number to contact patient is [phone]

## 2021-08-18 NOTE — Telephone Encounter (Signed)
Dr. Lajoyce Corners has an appt open this thrusday at 4pm or Denny Peon has something Friday morning this week too. Can you please call to make the appt? Thank you.

## 2021-08-19 ENCOUNTER — Telehealth: Payer: Self-pay | Admitting: Family

## 2021-08-19 NOTE — Telephone Encounter (Signed)
Called patient left message on voicemail that patient has a scheduled appointment 08/21/2021 at 9:00am with [place].

## 2021-08-21 ENCOUNTER — Ambulatory Visit: Payer: BC Managed Care – PPO | Admitting: Family

## 2021-08-28 DIAGNOSIS — F4321 Adjustment disorder with depressed mood: Secondary | ICD-10-CM | POA: Diagnosis not present

## 2021-08-28 DIAGNOSIS — F5101 Primary insomnia: Secondary | ICD-10-CM | POA: Diagnosis not present

## 2021-08-28 DIAGNOSIS — F341 Dysthymic disorder: Secondary | ICD-10-CM | POA: Diagnosis not present

## 2021-08-28 DIAGNOSIS — F900 Attention-deficit hyperactivity disorder, predominantly inattentive type: Secondary | ICD-10-CM | POA: Diagnosis not present

## 2021-09-06 DIAGNOSIS — G4733 Obstructive sleep apnea (adult) (pediatric): Secondary | ICD-10-CM | POA: Diagnosis not present

## 2021-11-04 DIAGNOSIS — G4733 Obstructive sleep apnea (adult) (pediatric): Secondary | ICD-10-CM | POA: Diagnosis not present

## 2021-11-27 ENCOUNTER — Ambulatory Visit: Payer: BC Managed Care – PPO | Admitting: Adult Health

## 2021-12-05 DIAGNOSIS — G4733 Obstructive sleep apnea (adult) (pediatric): Secondary | ICD-10-CM | POA: Diagnosis not present

## 2021-12-23 ENCOUNTER — Ambulatory Visit: Payer: BC Managed Care – PPO | Admitting: Pulmonary Disease

## 2021-12-23 DIAGNOSIS — F5101 Primary insomnia: Secondary | ICD-10-CM | POA: Diagnosis not present

## 2021-12-23 DIAGNOSIS — F900 Attention-deficit hyperactivity disorder, predominantly inattentive type: Secondary | ICD-10-CM | POA: Diagnosis not present

## 2021-12-23 DIAGNOSIS — F3342 Major depressive disorder, recurrent, in full remission: Secondary | ICD-10-CM | POA: Diagnosis not present

## 2021-12-24 ENCOUNTER — Encounter: Payer: Self-pay | Admitting: Pulmonary Disease

## 2021-12-24 ENCOUNTER — Ambulatory Visit (INDEPENDENT_AMBULATORY_CARE_PROVIDER_SITE_OTHER): Payer: BC Managed Care – PPO | Admitting: Pulmonary Disease

## 2021-12-24 DIAGNOSIS — G4733 Obstructive sleep apnea (adult) (pediatric): Secondary | ICD-10-CM

## 2021-12-24 NOTE — Progress Notes (Signed)
   Subjective:    Patient ID: Jorge Stevens, male    DOB: 1986-01-11, 36 y.o.   MRN: [id number]  HPI  36 yo morbidly obese  man with obstructive sleep apnea on CPAP 15cm  He also has longstanding insomnia  PMH : tonsillectomy 2008 , anxiety  Annual follow-up visit.  Accompanied by his wife who corroborates history.  He has episodes where he feels like he is gasping and underwater and he wakes up suddenly panting takes off his mask in runs out of his room.  He is awake and able to be oriented at this time.  He generally runs to his son's room so that he can remember where he is. He is seeing a psychiatrist who started him on Xanax but this makes him too drowsy especially in the mornings.  He is also been placed on Adderall to take in the daytime.  He feels pressure is high and has difficulty breathing against CPAP machine. On his last visit we had increased see PAP settings to auto 15 to 20 cm with EPR level of 3  Significant tests/ events reviewed   PSG >> mod obstructive sleep apnea >> started CPAP 10 cm, medium quattro mask, goal 12 cm   Review of Systems neg for any significant sore throat, dysphagia, itching, sneezing, nasal congestion or excess/ purulent secretions, fever, chills, sweats, unintended wt loss, pleuritic or exertional cp, hempoptysis, orthopnea pnd or change in chronic leg swelling. Also denies presyncope, palpitations, heartburn, abdominal pain, nausea, vomiting, diarrhea or change in bowel or urinary habits, dysuria,hematuria, rash, arthralgias, visual complaints, headache, numbness weakness or ataxia.     Objective:   Physical Exam   Gen. Pleasant, obese, in no distress ENT - no lesions, no post nasal drip Neck: No JVD, no thyromegaly, no carotid bruits Lungs: no use of accessory muscles, no dullness to percussion, decreased without rales or rhonchi  Cardiovascular: Rhythm regular, heart sounds  normal, no murmurs or gallops, no peripheral  edema Musculoskeletal: No deformities, no cyanosis or clubbing , no tremors        Assessment & Plan:    Insomnia -this appears to be longstanding.  Xanax gives him a hangover effect in the morning. I have asked him to use melatonin 5 to 10 mg. If this does not work we can trial [employer] because issue seems to be sleep onset insomnia

## 2021-12-24 NOTE — Addendum Note (Signed)
Addended by: Irine Seal B on: 12/24/2021 04:58 PM   Modules accepted: Orders

## 2021-12-24 NOTE — Patient Instructions (Signed)
X BiPAP titration study. Based on this we can get your BiPAP machine and set up CPAP.  Trial of melatonin 5 to 10 mg 2 hours prior to bedtime

## 2021-12-24 NOTE — Assessment & Plan Note (Signed)
Auto CPAP download shows average pressure of 18 cm with maximum pressure of 19 cm on auto settings 15 to 20 cm with minimal leak, average usage of 6.5 hours per night. He uses a large fullface mask of overwrap. We will set him up for BiPAP titration study and based on this get him a BiPAP machine since he is unable to tolerate high pressure on the CPAP I am hopeful that he will have a better experience with BiPAP  Weight loss encouraged, compliance with goal of at least 4-6 hrs every night is the expectation. Advised against medications with sedative side effects Cautioned against driving when sleepy - understanding that sleepiness will vary on a day to day basis

## 2021-12-25 NOTE — Addendum Note (Signed)
Addended by: Rigoberto Noel on: 12/25/2021 07:37 PM   Modules accepted: Orders

## 2021-12-30 DIAGNOSIS — M25511 Pain in right shoulder: Secondary | ICD-10-CM | POA: Diagnosis not present

## 2021-12-30 DIAGNOSIS — S43432A Superior glenoid labrum lesion of left shoulder, initial encounter: Secondary | ICD-10-CM | POA: Diagnosis not present

## 2021-12-30 DIAGNOSIS — M7502 Adhesive capsulitis of left shoulder: Secondary | ICD-10-CM | POA: Diagnosis not present

## 2021-12-30 DIAGNOSIS — M25512 Pain in left shoulder: Secondary | ICD-10-CM | POA: Diagnosis not present

## 2021-12-30 DIAGNOSIS — G8929 Other chronic pain: Secondary | ICD-10-CM | POA: Diagnosis not present

## 2022-01-04 DIAGNOSIS — G4733 Obstructive sleep apnea (adult) (pediatric): Secondary | ICD-10-CM | POA: Diagnosis not present

## 2022-01-07 ENCOUNTER — Telehealth: Payer: Self-pay | Admitting: Family Medicine

## 2022-01-07 NOTE — Telephone Encounter (Signed)
Patient came in office to drop off FMLA paperwork and has been placed in [employer].

## 2022-01-07 NOTE — Telephone Encounter (Signed)
Patient dropped off pre-filled out FMLA form.  There is a note attached (2nd page) with instructions.  Per note, this is due by 01/12/22.  Paperwork has been placed in your inbox for completion and signing.

## 2022-01-11 DIAGNOSIS — Z0279 Encounter for issue of other medical certificate: Secondary | ICD-10-CM

## 2022-01-11 NOTE — Telephone Encounter (Signed)
Done and in IN box 

## 2022-01-11 NOTE — Telephone Encounter (Signed)
Patient's Capabilities and Limitations form has been completed and faxed to fax# [phone].  Received fax confirmation.  Copies have been made for billing, scanning, patient, and myself.  I spoke to patient over phone, notifying him that this has been completed.  He will pick up his copy of the form.

## 2022-01-19 DIAGNOSIS — M75112 Incomplete rotator cuff tear or rupture of left shoulder, not specified as traumatic: Secondary | ICD-10-CM | POA: Diagnosis not present

## 2022-01-19 DIAGNOSIS — S43432A Superior glenoid labrum lesion of left shoulder, initial encounter: Secondary | ICD-10-CM | POA: Diagnosis not present

## 2022-01-19 DIAGNOSIS — X58XXXA Exposure to other specified factors, initial encounter: Secondary | ICD-10-CM | POA: Diagnosis not present

## 2022-01-19 DIAGNOSIS — Z9889 Other specified postprocedural states: Secondary | ICD-10-CM | POA: Diagnosis not present

## 2022-02-08 ENCOUNTER — Telehealth: Payer: Self-pay | Admitting: Family Medicine

## 2022-02-08 NOTE — Telephone Encounter (Signed)
Patient spouse came in and dropped off paperwork for patient. Placed in Dr. Lucretia Roers box. Thank you!

## 2022-02-08 NOTE — Telephone Encounter (Signed)
From placed in Dr. Lucretia Roers inbox for completion.

## 2022-02-10 NOTE — Telephone Encounter (Signed)
PCP filled out her part and form given to [facility]

## 2022-02-10 NOTE — Telephone Encounter (Signed)
Completed form has been faxed to fax# [phone].  Copies have been made for scanning, patient, and myself.  Sent msg via [employer] notifying patient that his copy is ready for pickup at front desk.

## 2022-02-22 ENCOUNTER — Telehealth: Payer: Self-pay | Admitting: Family Medicine

## 2022-02-22 NOTE — Telephone Encounter (Signed)
Patient wife called to talk to [facility] CMA and scheduled appointment to see Tower for virtual tomorrow at 4 pm because of sinus infection.

## 2022-02-23 ENCOUNTER — Ambulatory Visit: Payer: BC Managed Care – PPO | Admitting: Family Medicine

## 2022-02-23 ENCOUNTER — Telehealth (INDEPENDENT_AMBULATORY_CARE_PROVIDER_SITE_OTHER): Payer: BC Managed Care – PPO | Admitting: Family Medicine

## 2022-02-23 ENCOUNTER — Encounter: Payer: Self-pay | Admitting: Family Medicine

## 2022-02-23 VITALS — Wt 297.0 lb

## 2022-02-23 DIAGNOSIS — J069 Acute upper respiratory infection, unspecified: Secondary | ICD-10-CM

## 2022-02-23 MED ORDER — BENZONATATE 200 MG PO CAPS: 200.0000 mg | ORAL_CAPSULE | Freq: Three times a day (TID) | ORAL | 1 refills | Status: DC | PRN

## 2022-02-23 NOTE — Assessment & Plan Note (Signed)
Started 5 d ago  Congestion and cough, body aches and possible fever (no thermometer to check) Neg covid test at home times one (recommend he repeat if not feeling better)  Cannot rule out flu without coming to the office    Disc sympt care  Mucous is clear and no s/s of bacterial infection so far but will monitor  Recommend  Mucinex DM otc Ibuprofen 800 mg with food tid prn Tylenol up to every 4-6 h prn  Fluids/rest  Tessalon sent in for cough  Monitor closely  Disc ER precautions  If not improved recommend f/u in office for exam and testing

## 2022-02-23 NOTE — Telephone Encounter (Signed)
Not sure what pt's wife needed me to call so called and no answer so Left VM requesting spoue to call the office back or send mychart message on what is needed

## 2022-02-23 NOTE — Assessment & Plan Note (Deleted)
Started 5 d ago  Congestion and cough, body aches and possible fever (no thermometer to check) Neg covid test at home times one (recommend he repeat if not feeling better)  Cannot rule out flu without coming to the office    Disc sympt care  Mucous is clear and no s/s of bacterial infection so far but will monitor  Recommend  Mucinex DM otc Ibuprofen 800 mg with food tid prn Tylenol up to every 4-6 h prn  Fluids/rest  Tessalon sent in for cough  Monitor closely  Disc ER precautions  If not improved recommend f/u in office for exam and testing  

## 2022-02-23 NOTE — Patient Instructions (Signed)
Drink fluids and rest  mucinex DM is good for cough and congestion  Nasal saline for congestion as needed  Tylenol for fever or pain or headache up to every 6 hours  Also your ibuprofen 800 mg with foot up to three times daily Try the tessalon for cough/ I sent to your pharmacy  Please alert [place] if symptoms worsen (if severe or short of breath please go to the ER)   If symptoms worsen or do not improve please schedule in office visit so we can examine you and do testing if necessary   I cannot guarantee this is not the flu

## 2022-02-23 NOTE — Progress Notes (Signed)
Virtual Visit via Video Note  I connected with Jorge Stevens on 02/23/22 at  4:00 PM EST by a video enabled telemedicine application and verified that I am speaking with the correct person using two identifiers.  Location: Patient: home Provider: office   I discussed the limitations of evaluation and management by telemedicine and the availability of in person appointments. The patient expressed understanding and agreed to proceed.  Parties involved in encounter  Patient: Jorge Stevens    Provider:  Roxy Manns MD   Virtual connection failed today so visit was done by phone    History of Present Illness: Pt presents with sinus symptoms    Started with a cough about Friday  Then throat and chest  Then ST that got worse  Thought he was getting a head cold   Hoarse voice   Rattle in his chest  Some phlegm , clear in color  Ribs are sore from coughing   A lot of nasal congestion  Headaches-frontal / back and temples  Can get severe  Both sides equally   Last night he was hot and cold/ a little sweating  Has not checked his temp  Some body aches   No wheezing    Now wife has a cough   Covid test was negative   Otc:  Was taking mucinex , plain  Taking ibuprofen- helps a little   Patient Active Problem List   Diagnosis Date Noted   History of jock itch 06/10/2021   Grief reaction 11/24/2018   Diarrhea 10/08/2018   Varicose veins of both lower extremities 01/17/2018   Low back pain 01/17/2018   Rotator cuff tear arthropathy, left 07/06/2017   Impingement syndrome of left shoulder    Acute sinusitis 01/04/2017   Pre-diabetes 03/23/2015   Chronic bursitis of left shoulder 04/23/2014   Hyperlipidemia 09/26/2012   Hypertension, essential 12/16/2010   Routine general medical examination at a health care facility 12/07/2010   Anal fissure 08/13/2010   OSA on CPAP 12/01/2008   WISDOM TEETH EXTRACTION, HX OF 11/26/2008   Morbid obesity (HCC) 07/28/2006    Past Medical History:  Diagnosis Date   Anxiety    Morbid obesity with BMI of 45.0-49.9, adult (HCC)    Other alteration of consciousness    Other specified personal history presenting hazards to health(V15.89)    Shoulder impingement syndrome, left    Unspecified sleep apnea    Past Surgical History:  Procedure Laterality Date   SHOULDER ARTHROSCOPY Left 07/06/2017   Procedure: LEFT SHOULDER ARTHROSCOPY, DEBRIDEMENT, AND DECOMPRESSION;  Surgeon: Nadara Mustard, MD;  Location: [facility] OR;  Service: Orthopedics;  Laterality: Left;   SHOULDER ARTHROSCOPY W/ SUBACROMIAL DECOMPRESSION AND DISTAL CLAVICLE EXCISION Left    Dr. Kristeen Miss   TONSILLECTOMY     WISDOM TOOTH EXTRACTION     Social History   Tobacco Use   Smoking status: Former    Packs/day: 1.50    Years: 6.00    Total pack years: 9.00    Types: Cigarettes    Quit date: 03/16/2007    Years since quitting: 14.9   Smokeless tobacco: Never  Vaping Use   Vaping Use: Former  Substance Use Topics   Alcohol use: Yes    Alcohol/week: 0.0 standard drinks of alcohol    Comment: Occasional   Drug use: No   Family History  Problem Relation Age of Onset   Depression Mother    Hyperlipidemia Mother    Early death Mother  Stroke Mother    Hyperlipidemia Father    Diabetes Other        grandparents   Throat cancer Other        grandmother   Lung cancer Other        grandfather   Allergies  Allergen Reactions   Robaxin [Methocarbamol]     Sleepy    Ambien [Zolpidem Tartrate] Itching   Current Outpatient Medications on File Prior to Visit  Medication Sig Dispense Refill   ALPRAZolam (XANAX) 1 MG tablet Take 1 mg by mouth 3 (three) times daily.     amphetamine-dextroamphetamine (ADDERALL) 30 MG tablet Take 30 mg by mouth daily.     cloNIDine (CATAPRES) 0.1 MG tablet Take 0.1 mg by mouth daily. 1-3 tabs at QHS     fluticasone (FLONASE) 50 MCG/ACT nasal spray Place 2 sprays into both nostrils daily as needed for  allergies. 16 g 11   gabapentin (NEURONTIN) 300 MG capsule Take 1 capsule (300 mg total) by mouth 3 (three) times daily. 3 times a day when necessary neuropathy pain 90 capsule 3   ibuprofen (ADVIL) 800 MG tablet TAKE 1 TABLET BY MOUTH EVERY 8 HOURS AS NEEDED 60 tablet 1   ketoconazole (NIZORAL) 2 % cream Apply 1 application. topically daily. To affected area 15 g 1   Melatonin 10 MG TABS Take 5 mg by mouth as needed (2 hours before sleep). 30 tablet 0   NON FORMULARY CPAP 12 cm, full face mask     nortriptyline (PAMELOR) 50 MG capsule Take 200 mg by mouth at bedtime.     Soft Lens Products (REWETTING DROPS) SOLN Apply 1 drop to eye daily as needed (for dry lenses/lubricant eyes.).     No current facility-administered medications on file prior to visit.   Review of Systems  Constitutional:  Positive for chills and malaise/fatigue. Negative for fever.  HENT:  Positive for congestion, sinus pain and sore throat. Negative for ear pain.   Eyes:  Negative for blurred vision, discharge and redness.  Respiratory:  Positive for cough and sputum production. Negative for shortness of breath, wheezing and stridor.   Cardiovascular:  Negative for chest pain, palpitations and leg swelling.  Gastrointestinal:  Negative for abdominal pain, diarrhea, nausea and vomiting.  Musculoskeletal:  Negative for myalgias.  Skin:  Negative for rash.  Neurological:  Positive for headaches. Negative for dizziness.      Observations/Objective: Pt sounds fatigued and hoarse  Good historian/nl cognition  Occ clears throat  One wet sounding cough  No audible wheezing  Not sob with speech  Nl mood   Assessment and Plan: Problem List Items Addressed This Visit       Respiratory   Acute sinusitis - Primary    Started 5 d ago  Congestion and cough, body aches and possible fever (no thermometer to check) Neg covid test at home times one (recommend he repeat if not feeling better)  Cannot rule out flu without  coming to the office    Disc sympt care  Mucous is clear and no s/s of bacterial infection so far but will monitor  Recommend  Mucinex DM otc Ibuprofen 800 mg with food tid prn Tylenol up to every 4-6 h prn  Fluids/rest  Tessalon sent in for cough  Monitor closely  Disc ER precautions  If not improved recommend f/u in office for exam and testing       Relevant Medications   benzonatate (TESSALON) 200 MG capsule  Follow Up Instructions: Drink fluids and rest  mucinex DM is good for cough and congestion  Nasal saline for congestion as needed  Tylenol for fever or pain or headache up to every 6 hours  Also your ibuprofen 800 mg with foot up to three times daily Try the tessalon for cough/ I sent to your pharmacy  Please alert [place] if symptoms worsen (if severe or short of breath please go to the ER)   If symptoms worsen or do not improve please schedule in office visit so we can examine you and do testing if necessary   I cannot guarantee this is not the flu    I discussed the assessment and treatment plan with the patient. The patient was provided an opportunity to ask questions and all were answered. The patient agreed with the plan and demonstrated an understanding of the instructions.   The patient was advised to call back or seek an in-person evaluation if the symptoms worsen or if the condition fails to improve as anticipated.  I provided 16 minutes of non-face-to-face time during this encounter.   Roxy Manns, MD

## 2022-02-24 ENCOUNTER — Encounter (HOSPITAL_BASED_OUTPATIENT_CLINIC_OR_DEPARTMENT_OTHER): Payer: BC Managed Care – PPO | Admitting: Pulmonary Disease

## 2022-02-24 NOTE — Telephone Encounter (Signed)
Patient wife came by and dropped off forms to be signed by Dr Milinda Antis. She stated that she would know what they were regarding. Forms left In envelope up front in her folder.

## 2022-02-24 NOTE — Telephone Encounter (Signed)
Forms in your inbox

## 2022-02-25 ENCOUNTER — Telehealth: Payer: Self-pay

## 2022-02-25 NOTE — Telephone Encounter (Signed)
Done and in IN box 

## 2022-02-25 NOTE — Telephone Encounter (Signed)
Patient brought in one page from his last FMLA form that needed to be updated with frequency of visits.  Dr Milinda Antis initialed form and it has been faxed to fax#[phone].  Received fax confirmation.  Copies have been made for scanning, patient, and myself.  Patient's copy has been placed at front office for pickup.  Patient notified via mychart.

## 2022-03-26 ENCOUNTER — Encounter (HOSPITAL_BASED_OUTPATIENT_CLINIC_OR_DEPARTMENT_OTHER): Payer: BC Managed Care – PPO | Admitting: Pulmonary Disease

## 2022-03-31 DIAGNOSIS — G4733 Obstructive sleep apnea (adult) (pediatric): Secondary | ICD-10-CM | POA: Diagnosis not present

## 2022-04-11 ENCOUNTER — Ambulatory Visit (HOSPITAL_BASED_OUTPATIENT_CLINIC_OR_DEPARTMENT_OTHER): Payer: BC Managed Care – PPO | Attending: Pulmonary Disease | Admitting: Pulmonary Disease

## 2022-04-11 DIAGNOSIS — G4733 Obstructive sleep apnea (adult) (pediatric): Secondary | ICD-10-CM | POA: Insufficient documentation

## 2022-04-15 DIAGNOSIS — G4733 Obstructive sleep apnea (adult) (pediatric): Secondary | ICD-10-CM | POA: Diagnosis not present

## 2022-04-15 NOTE — Procedures (Signed)
Patient Name: Jorge Stevens, Jorge Stevens Date: 04/11/2022 Gender: Male D.O.B: 1985-04-19 Age (years): 24 Referring Provider: Kara Mead MD, ABSM Height (inches): 73 Interpreting Physician: Kara Mead MD, ABSM Weight (lbs): 286 RPSGT: [place], [place] BMI: 38 MRN: [id number] Neck Size: 17.50 <br> <br> CLINICAL INFORMATION The patient is referred for a BiPAP titration to treat sleep apnea. CPAP downlaod showed requirementof 19 cm & he is unabe to tolerate high pressures on CPAP    Date of NPSG: 01/2009 showed moderate OSA  SLEEP STUDY TECHNIQUE As per the AASM Manual for the Scoring of Sleep and Associated Events v2.3 (April 2016) with a hypopnea requiring 4% desaturations.  The channels recorded and monitored were frontal, central and occipital EEG, electrooculogram (EOG), submentalis EMG (chin), nasal and oral airflow, thoracic and abdominal wall motion, anterior tibialis EMG, snore microphone, electrocardiogram, and pulse oximetry. Bilevel positive airway pressure (BPAP) was initiated at the beginning of the study and titrated to treat sleep-disordered breathing.  MEDICATIONS Medications self-administered by patient taken the night of the study : N/A  RESPIRATORY PARAMETERS Optimal IPAP Pressure (cm): 17 AHI at Optimal Pressure (/hr) 0 Optimal EPAP Pressure (cm): 10   Overall Minimal O2 (%): 89.0 Minimal O2 at Optimal Pressure (%): 94.0 SLEEP ARCHITECTURE Start Time: 10:23:10 PM Stop Time: 4:45:14 AM Total Time (min): 382.1 Total Sleep Time (min): 375.5 Sleep Latency (min): 1.7 Sleep Efficiency (%): 98.3% REM Latency (min): 93.0 WASO (min): 4.8 Stage N1 (%): 4.1% Stage N2 (%): 76.7% Stage N3 (%): 0.0% Stage R (%): 19.2 Supine (%): 100.00 Arousal Index (/hr): 6.6     CARDIAC DATA The 2 lead EKG demonstrated sinus rhythm. The mean heart rate was 61.6 beats per minute. Other EKG findings include: None.   LEG MOVEMENT DATA The total Periodic Limb Movements of Sleep (PLMS)  were 0. The PLMS index was 0.0. A PLMS index of <15 is considered normal in adults.  IMPRESSIONS - An optimal PAP pressure was selected for this patient ( 17 / 10 cm of water) - Mild oxygen desaturations were observed during this titration (min O2 = 89.0%). - The patient snored with moderate snoring volume. - No cardiac abnormalities were observed during this study. - Clinically significant periodic limb movements were not noted during this study. Arousals associated with PLMs were rare.   DIAGNOSIS - Obstructive Sleep Apnea (G47.33)   RECOMMENDATIONS - Trial of BiPAP therapy on 17/10 cm H2O with a Medium size Resmed Full Face AirFit F30 mask and heated humidification. - Avoid alcohol, sedatives and other CNS depressants that may worsen sleep apnea and disrupt normal sleep architecture. - Sleep hygiene should be reviewed to assess factors that may improve sleep quality. - Weight management and regular exercise should be initiated or continued. - Return to [facility] for re-evaluation after 4 weeks of therapy   Kara Mead MD Board Certified in [place]

## 2022-05-01 DIAGNOSIS — G4733 Obstructive sleep apnea (adult) (pediatric): Secondary | ICD-10-CM | POA: Diagnosis not present

## 2022-05-10 ENCOUNTER — Other Ambulatory Visit: Payer: Self-pay

## 2022-05-10 DIAGNOSIS — G4733 Obstructive sleep apnea (adult) (pediatric): Secondary | ICD-10-CM

## 2022-05-30 DIAGNOSIS — G4733 Obstructive sleep apnea (adult) (pediatric): Secondary | ICD-10-CM | POA: Diagnosis not present

## 2022-06-28 ENCOUNTER — Ambulatory Visit (INDEPENDENT_AMBULATORY_CARE_PROVIDER_SITE_OTHER): Payer: BC Managed Care – PPO | Admitting: Family Medicine

## 2022-06-28 ENCOUNTER — Encounter: Payer: Self-pay | Admitting: Family Medicine

## 2022-06-28 VITALS — BP 148/98 | HR 87 | Temp 98.0°F | Ht 73.0 in | Wt 292.1 lb

## 2022-06-28 DIAGNOSIS — S86811A Strain of other muscle(s) and tendon(s) at lower leg level, right leg, initial encounter: Secondary | ICD-10-CM | POA: Diagnosis not present

## 2022-06-28 NOTE — Progress Notes (Signed)
Jorge Weltz T. Christop Hippert, MD, CAQ Sports Medicine [facility] at [facility] [address] [place], [address]  Phone: [phone]  FAX: [phone]  Jorge Stevens - 37 y.o. male  MRN [id number]  Date of Birth: [birth year]  Date: 06/28/2022  PCP: Judy Pimple, MD  Referral: Judy Pimple, MD  Chief Complaint  Patient presents with   Leg Pain    Right Calf-Heard calf pop while playing basketball yesterday   Subjective:   Jorge Stevens is a 37 y.o. very pleasant male patient with Body mass index is 38.54 kg/m. who presents with the following:  Very pleasant patient presents with calf pain.  Right medial gastroc tear.  Yesterday, he was playing basketball with his son and felt a pop in the posterior aspect of the right leg and calf.  Since then he has been having quite a bit of difficulty ambulating, and he can walk with very significant antalgia.  He also has had swelling in the calf, but no bruising as of yet.  He did hear an audible pop at the time of injury.  No discomfort or changes at the Achilles tendon.  Compression Calf rehab Ice  Crutches  Review of Systems is noted in the HPI, as appropriate  Objective:   BP (!) 148/98   Pulse 87   Temp 98 F (36.7 C) (Temporal)   Ht 6\' 1"  (1.854 m)   Wt 292 lb 2 oz (132.5 kg)   SpO2 99%   BMI 38.54 kg/m   GEN: No acute distress; alert,appropriate. PULM: Breathing comfortably in no respiratory distress PSYCH: Normally interactive.   Full range of motion at the right knee as well as the right ankle.  No bony tenderness throughout the knee, at the entire lower extremity as well as the foot and ankle.  He does have tenderness in the calf with compression in the area of maximal tenderness is in the medial gastrocnemius.  The entire course of the Achilles tendon is felt very easily and it is clearly intact at its insertion point at the calcaneus.  There is no bruising.  I do not feel a palpable  gap.  Laboratory and Imaging Data:  Assessment and Plan:     ICD-10-CM   1. Strain of calf muscle, right, initial encounter  Y30.160F      Acute tennis leg/medial gastrocnemius tear.  I reviewed pathophysiology of this injury, reviewed anatomy.  I am going to placed him on crutches for the next 2 weeks, and have him do some gentle rehab.  Starting off just barely doing some range of motion and some band work.  Progress this as he is able.  If he does have some symptoms that are more long-lasting, then a round of formal physical therapy would make some sense.  Social: Right now he is minimally able to walk  Medication Management during today's office visit: No orders of the defined types were placed in this encounter.  Medications Discontinued During This Encounter  Medication Reason   gabapentin (NEURONTIN) 300 MG capsule Completed Course   ketoconazole (NIZORAL) 2 % cream Completed Course   benzonatate (TESSALON) 200 MG capsule Completed Course    Orders placed today for conditions managed today: No orders of the defined types were placed in this encounter.   Disposition: No follow-ups on file.  [employer] One speech-to-text software was used for transcription in this dictation.  Possible transcriptional errors can occur using Animal nutritionist.  Signed,  Elpidio Galea. Debe Anfinson, MD   Outpatient Encounter Medications as of 06/28/2022  Medication Sig   ALPRAZolam (XANAX) 1 MG tablet Take 1 mg by mouth 3 (three) times daily.   amphetamine-dextroamphetamine (ADDERALL) 30 MG tablet Take 30 mg by mouth daily.   cloNIDine (CATAPRES) 0.1 MG tablet Take 0.1 mg by mouth daily. 1-3 tabs at QHS   fluticasone (FLONASE) 50 MCG/ACT nasal spray Place 2 sprays into both nostrils daily as needed for allergies.   ibuprofen (ADVIL) 800 MG tablet TAKE 1 TABLET BY MOUTH EVERY 8 HOURS AS NEEDED   Melatonin 10 MG TABS Take 5 mg by mouth as needed (2 hours before sleep).   NON FORMULARY CPAP  12 cm, full face mask   nortriptyline (PAMELOR) 50 MG capsule Take 200 mg by mouth at bedtime.   Soft Lens Products (REWETTING DROPS) SOLN Apply 1 drop to eye daily as needed (for dry lenses/lubricant eyes.).   [DISCONTINUED] benzonatate (TESSALON) 200 MG capsule Take 1 capsule (200 mg total) by mouth 3 (three) times daily as needed for cough. Swallow whole   [DISCONTINUED] gabapentin (NEURONTIN) 300 MG capsule Take 1 capsule (300 mg total) by mouth 3 (three) times daily. 3 times a day when necessary neuropathy pain   [DISCONTINUED] ketoconazole (NIZORAL) 2 % cream Apply 1 application. topically daily. To affected area   No facility-administered encounter medications on file as of 06/28/2022.

## 2022-07-13 ENCOUNTER — Ambulatory Visit (HOSPITAL_BASED_OUTPATIENT_CLINIC_OR_DEPARTMENT_OTHER): Payer: BC Managed Care – PPO | Admitting: Pulmonary Disease

## 2022-07-19 ENCOUNTER — Encounter (HOSPITAL_BASED_OUTPATIENT_CLINIC_OR_DEPARTMENT_OTHER): Payer: Self-pay | Admitting: Pulmonary Disease

## 2022-07-19 ENCOUNTER — Ambulatory Visit (HOSPITAL_BASED_OUTPATIENT_CLINIC_OR_DEPARTMENT_OTHER): Payer: BC Managed Care – PPO | Admitting: Pulmonary Disease

## 2022-07-22 DIAGNOSIS — F900 Attention-deficit hyperactivity disorder, predominantly inattentive type: Secondary | ICD-10-CM | POA: Diagnosis not present

## 2022-07-22 DIAGNOSIS — F5101 Primary insomnia: Secondary | ICD-10-CM | POA: Diagnosis not present

## 2022-07-22 DIAGNOSIS — F3342 Major depressive disorder, recurrent, in full remission: Secondary | ICD-10-CM | POA: Diagnosis not present

## 2022-08-31 ENCOUNTER — Telehealth: Payer: Self-pay | Admitting: Pulmonary Disease

## 2022-08-31 DIAGNOSIS — G4733 Obstructive sleep apnea (adult) (pediatric): Secondary | ICD-10-CM

## 2022-08-31 NOTE — Telephone Encounter (Signed)
Pt. Calling needs new script for bibpap machine sent to someone else than [employer] he is using the cpap instead but [employer] [facility] said he had a bill from 3-4 years ago that needs to be paid before he can get machine

## 2022-09-03 NOTE — Telephone Encounter (Signed)
RA, please advise.  

## 2022-09-03 NOTE — Telephone Encounter (Signed)
Please F/u

## 2022-09-06 NOTE — Telephone Encounter (Signed)
Order has ben placed to be sent to a new DME company.   Nothing further needed.

## 2023-01-27 ENCOUNTER — Encounter: Payer: Self-pay | Admitting: Family Medicine

## 2023-01-27 NOTE — Telephone Encounter (Signed)
Lvmtcb, sent mychart message  

## 2023-01-27 NOTE — Telephone Encounter (Signed)
Per Dr. Milinda Antis pt needs an "IN person/office" appt to fill out forms please schedule

## 2023-01-29 DIAGNOSIS — F5101 Primary insomnia: Secondary | ICD-10-CM | POA: Diagnosis not present

## 2023-01-29 DIAGNOSIS — F3342 Major depressive disorder, recurrent, in full remission: Secondary | ICD-10-CM | POA: Diagnosis not present

## 2023-01-29 DIAGNOSIS — F900 Attention-deficit hyperactivity disorder, predominantly inattentive type: Secondary | ICD-10-CM | POA: Diagnosis not present

## 2023-02-02 ENCOUNTER — Encounter: Payer: Self-pay | Admitting: Family Medicine

## 2023-02-02 ENCOUNTER — Ambulatory Visit (INDEPENDENT_AMBULATORY_CARE_PROVIDER_SITE_OTHER): Payer: BC Managed Care – PPO | Admitting: Family Medicine

## 2023-02-02 VITALS — BP 150/92 | HR 104 | Temp 98.9°F | Ht 73.0 in | Wt 292.4 lb

## 2023-02-02 DIAGNOSIS — M25512 Pain in left shoulder: Secondary | ICD-10-CM | POA: Diagnosis not present

## 2023-02-02 DIAGNOSIS — I10 Essential (primary) hypertension: Secondary | ICD-10-CM | POA: Diagnosis not present

## 2023-02-02 DIAGNOSIS — E6609 Other obesity due to excess calories: Secondary | ICD-10-CM

## 2023-02-02 DIAGNOSIS — M7552 Bursitis of left shoulder: Secondary | ICD-10-CM | POA: Diagnosis not present

## 2023-02-02 DIAGNOSIS — G8929 Other chronic pain: Secondary | ICD-10-CM

## 2023-02-02 DIAGNOSIS — E66812 Obesity, class 2: Secondary | ICD-10-CM

## 2023-02-02 DIAGNOSIS — Z23 Encounter for immunization: Secondary | ICD-10-CM

## 2023-02-02 DIAGNOSIS — R7303 Prediabetes: Secondary | ICD-10-CM | POA: Diagnosis not present

## 2023-02-02 DIAGNOSIS — E7800 Pure hypercholesterolemia, unspecified: Secondary | ICD-10-CM | POA: Diagnosis not present

## 2023-02-02 DIAGNOSIS — Z6838 Body mass index (BMI) 38.0-38.9, adult: Secondary | ICD-10-CM

## 2023-02-02 LAB — COMPREHENSIVE METABOLIC PANEL
ALT: 30 U/L (ref 0–53)
AST: 28 U/L (ref 0–37)
Albumin: 4.7 g/dL (ref 3.5–5.2)
Alkaline Phosphatase: 58 U/L (ref 39–117)
BUN: 12 mg/dL (ref 6–23)
CO2: 31 meq/L (ref 19–32)
Calcium: 9.9 mg/dL (ref 8.4–10.5)
Chloride: 101 meq/L (ref 96–112)
Creatinine, Ser: 1.13 mg/dL (ref 0.40–1.50)
GFR: 82.23 mL/min (ref >60.00)
Glucose, Bld: 102 mg/dL — ABNORMAL HIGH (ref 70–99)
Potassium: 4 meq/L (ref 3.5–5.1)
Sodium: 139 meq/L (ref 135–145)
Total Bilirubin: 1.6 mg/dL — ABNORMAL HIGH (ref 0.2–1.2)
Total Protein: 7.5 g/dL (ref 6.0–8.3)

## 2023-02-02 LAB — CBC WITH DIFFERENTIAL/PLATELET
Basophils Absolute: 0.1 10*3/uL (ref 0.0–0.1)
Basophils Relative: 1.1 % (ref 0.0–3.0)
Eosinophils Absolute: 0.1 10*3/uL (ref 0.0–0.7)
Eosinophils Relative: 1.9 % (ref 0.0–5.0)
HCT: 46.5 % (ref 39.0–52.0)
Hemoglobin: 15.3 g/dL (ref 13.0–17.0)
Lymphocytes Relative: 26.7 % (ref 12.0–46.0)
Lymphs Abs: 1.7 10*3/uL (ref 0.7–4.0)
MCHC: 33 g/dL (ref 30.0–36.0)
MCV: 88.9 fL (ref 78.0–100.0)
Monocytes Absolute: 0.6 10*3/uL (ref 0.1–1.0)
Monocytes Relative: 9.6 % (ref 3.0–12.0)
Neutro Abs: 3.9 10*3/uL (ref 1.4–7.7)
Neutrophils Relative %: 60.7 % (ref 43.0–77.0)
Platelets: 412 10*3/uL — ABNORMAL HIGH (ref 150.0–400.0)
RBC: 5.23 Mil/uL (ref 4.22–5.81)
RDW: 13.7 % (ref 11.5–15.5)
WBC: 6.4 10*3/uL (ref 4.0–10.5)

## 2023-02-02 LAB — LIPID PANEL
Cholesterol: 192 mg/dL (ref 0–200)
HDL: 40.2 mg/dL (ref >39.00)
LDL Cholesterol: 140 mg/dL — ABNORMAL HIGH (ref 0–99)
NonHDL: 152.1
Total CHOL/HDL Ratio: 5
Triglycerides: 61 mg/dL (ref 0.0–149.0)
VLDL: 12.2 mg/dL (ref 0.0–40.0)

## 2023-02-02 LAB — HEMOGLOBIN A1C: Hgb A1c MFr Bld: 5.7 % (ref 4.6–6.5)

## 2023-02-02 LAB — TSH: TSH: 0.43 u[IU]/mL (ref 0.35–5.50)

## 2023-02-02 NOTE — Assessment & Plan Note (Addendum)
FMLA papers filled out for missed work and accomodations  No changes  Declined more surgery with [facility] for shoulder arthropathy, adhesive capsulitis and supraspinatus high grade tear  Reviewed last ortho note en MR arthrogram report in care everywhere today  Has had numerous surgeries  Goes to PT Sees chiropractor

## 2023-02-02 NOTE — Progress Notes (Signed)
Subjective:    Patient ID: Jorge Stevens, male    DOB: 1986/02/19, 37 y.o.   MRN: [id number]  HPI  Wt Readings from Last 3 Encounters:  02/02/23 292 lb 6 oz (132.6 kg)  06/28/22 292 lb 2 oz (132.5 kg)  04/11/22 286 lb (129.7 kg)   38.57 kg/m  Has worked on weight loss  Almost 60 lb  Intermittent fasting 9 pm to 1 pm next day  Lot of water intake  Cut out a lot of processed foods and fast foods  Does exercise- cardio mostly / limited with upper body  Runs with his son  Elliptical   Vitals:   02/02/23 0941 02/02/23 1002  BP: (!) 148/96 (!) 150/92  Pulse: (!) 104   Temp: 98.9 F (37.2 C)   SpO2: 95%     Pt presents for FMLA paperwork completion  (for shoulder complaint)   Chronic left shoulder pain and weakness and limited rom  Past PT, injections , surgery , otd pain med and pain clinic   History of bursitis /chronic  Also tear and several surgeries   Saw ortho Dr Marin Shutter with [facility] 12/2021  Superior glenoid labrum lesion left shoulder  Rotator cuff arthropathy  Adhesive capsulitis  MR arthrogram ordered   IMPRESSION:   1.  Supraspinatus tendinopathy including high grade partial thickness tear of the anterior fibers at the footprint. 2.  Postsurgical versus degenerative changes of the labrum without discrete tear. 3.  Postsurgical changes status post distal clavicular resection.   They wanted to do surgery but he declined (arthroscopic to clean it out but tear was not fixable)  ? If it would be effective   Instead -decided to go to chiropractor and PT   Now  Good and bad days Arm feels heavy  Very painful some days - very sharp / rainy days are worse (like today)  Takes ibuprofen and tylenol prn   Did not want to continue with pain clinic and narcotics     HTN bp is stable today  No cp or palpitations or headaches or edema  No side effects to medicines  BP Readings from Last 3 Encounters:  02/02/23 (!) 150/92  06/28/22 (!) 148/98   12/24/21 132/88    Catapres 0.1 mg daily (this was from psychiatry for insomnia)  Ran out of this - just had visit Saturday and med is at the pharmacy to pick up        Lab Results  Component Value Date   NA 139 02/02/2023   K 4.0 02/02/2023   CO2 31 02/02/2023   GLUCOSE 102 (H) 02/02/2023   BUN 12 02/02/2023   CREATININE 1.13 02/02/2023   CALCIUM 9.9 02/02/2023   GFR 82.23 02/02/2023   GFRNONAA >60 06/29/2017   Hyperlipidemia Lab Results  Component Value Date   CHOL 192 02/02/2023   HDL 40.20 02/02/2023   LDLCALC 140 (H) 02/02/2023   LDLDIRECT 156.3 11/24/2012   TRIG 61.0 02/02/2023   CHOLHDL 5 02/02/2023   Prediabetes Lab Results  Component Value Date   HGBA1C 5.7 02/02/2023       Patient Active Problem List   Diagnosis Date Noted   History of jock itch 06/10/2021   Grief reaction 11/24/2018   Varicose veins of both lower extremities 01/17/2018   Low back pain 01/17/2018   Rotator cuff tear arthropathy, left 07/06/2017   Impingement syndrome of left shoulder    Pre-diabetes 03/23/2015   Chronic shoulder pain 04/23/2014   Hyperlipidemia  09/26/2012   Hypertension, essential 12/16/2010   Routine general medical examination at a health care facility 12/07/2010   Anal fissure 08/13/2010   OSA on CPAP 12/01/2008   Personal history presenting hazards to health 11/26/2008   Class 2 obesity due to excess calories with body mass index (BMI) of 38.0 to 38.9 in adult 07/28/2006   Past Medical History:  Diagnosis Date   Anxiety    Morbid obesity with BMI of 45.0-49.9, adult (HCC)    Other alteration of consciousness    Other specified personal history presenting hazards to health(V15.89)    Shoulder impingement syndrome, left    Unspecified sleep apnea    Past Surgical History:  Procedure Laterality Date   SHOULDER ARTHROSCOPY Left 07/06/2017   Procedure: LEFT SHOULDER ARTHROSCOPY, DEBRIDEMENT, AND DECOMPRESSION;  Surgeon: Nadara Mustard, MD;  Location: [facility]  OR;  Service: Orthopedics;  Laterality: Left;   SHOULDER ARTHROSCOPY W/ SUBACROMIAL DECOMPRESSION AND DISTAL CLAVICLE EXCISION Left    Dr. Kristeen Miss   TONSILLECTOMY     WISDOM TOOTH EXTRACTION     Social History   Tobacco Use   Smoking status: Former    Current packs/day: 0.00    Average packs/day: 1.5 packs/day for 6.0 years (9.0 ttl pk-yrs)    Types: Cigarettes    Start date: 03/15/2001    Quit date: 03/16/2007    Years since quitting: 15.8   Smokeless tobacco: Never  Vaping Use   Vaping status: Former  Substance Use Topics   Alcohol use: Yes    Alcohol/week: 0.0 standard drinks of alcohol    Comment: Occasional   Drug use: No   Family History  Problem Relation Age of Onset   Depression Mother    Hyperlipidemia Mother    Early death Mother    Stroke Mother    Hyperlipidemia Father    Diabetes Other        grandparents   Throat cancer Other        grandmother   Lung cancer Other        grandfather   Allergies  Allergen Reactions   Robaxin [Methocarbamol]     Sleepy    Ambien [Zolpidem Tartrate] Itching   Current Outpatient Medications on File Prior to Visit  Medication Sig Dispense Refill   ALPRAZolam (XANAX) 1 MG tablet Take 1 mg by mouth 3 (three) times daily.     amphetamine-dextroamphetamine (ADDERALL) 30 MG tablet Take 30 mg by mouth daily.     cloNIDine (CATAPRES) 0.1 MG tablet Take 0.1 mg by mouth daily. 1-3 tabs at [facility]     fluticasone (FLONASE) 50 MCG/ACT nasal spray Place 2 sprays into both nostrils daily as needed for allergies. 16 g 11   ibuprofen (ADVIL) 800 MG tablet TAKE 1 TABLET BY MOUTH EVERY 8 HOURS AS NEEDED 60 tablet 1   Melatonin 10 MG TABS Take 5 mg by mouth as needed (2 hours before sleep). 30 tablet 0   NON FORMULARY CPAP 12 cm, full face mask     nortriptyline (PAMELOR) 50 MG capsule Take 200 mg by mouth at bedtime.     Soft Lens Products (REWETTING DROPS) SOLN Apply 1 drop to eye daily as needed (for dry lenses/lubricant eyes.).     No  current facility-administered medications on file prior to visit.    Review of Systems  Constitutional:  Negative for activity change, appetite change, fatigue, fever and unexpected weight change.  HENT:  Negative for congestion, rhinorrhea, sore throat and trouble  swallowing.   Eyes:  Negative for pain, redness, itching and visual disturbance.  Respiratory:  Negative for cough, chest tightness, shortness of breath and wheezing.   Cardiovascular:  Negative for chest pain and palpitations.  Gastrointestinal:  Negative for abdominal pain, blood in stool, constipation, diarrhea and nausea.  Endocrine: Negative for cold intolerance, heat intolerance, polydipsia and polyuria.  Genitourinary:  Negative for difficulty urinating, dysuria, frequency and urgency.  Musculoskeletal:  Positive for arthralgias. Negative for joint swelling and myalgias.  Skin:  Negative for pallor and rash.  Neurological:  Negative for dizziness, tremors, weakness, numbness and headaches.  Hematological:  Negative for adenopathy. Does not bruise/bleed easily.  Psychiatric/Behavioral:  Negative for decreased concentration and dysphoric mood. The patient is nervous/anxious.        Objective:   Physical Exam Constitutional:      General: He is not in acute distress.    Appearance: Normal appearance. He is well-developed. He is obese. He is not ill-appearing or diaphoretic.  HENT:     Head: Normocephalic and atraumatic.  Eyes:     Conjunctiva/sclera: Conjunctivae normal.     Pupils: Pupils are equal, round, and reactive to light.  Neck:     Thyroid: No thyromegaly.     Vascular: No carotid bruit or JVD.  Cardiovascular:     Rate and Rhythm: Normal rate and regular rhythm.     Heart sounds: Normal heart sounds.     No gallop.  Pulmonary:     Effort: Pulmonary effort is normal. No respiratory distress.     Breath sounds: Normal breath sounds. No wheezing or rales.  Abdominal:     General: There is no distension  or abdominal bruit.     Palpations: Abdomen is soft.  Musculoskeletal:     Cervical back: Normal range of motion and neck supple.     Right lower leg: No edema.     Left lower leg: No edema.     Comments: Can abduct left shoulder no more than 30 deg with pain  Positive hawking and neer test on left  Normal grip  Tender in acromion area   Lymphadenopathy:     Cervical: No cervical adenopathy.  Skin:    General: Skin is warm and dry.     Coloration: Skin is not pale.     Findings: No rash.  Neurological:     Mental Status: He is alert.     Cranial Nerves: No cranial nerve deficit.     Coordination: Coordination normal.     Deep Tendon Reflexes: Reflexes are normal and symmetric. Reflexes normal.  Psychiatric:        Mood and Affect: Mood normal.           Assessment & Plan:   Problem List Items Addressed This Visit       Cardiovascular and Mediastinum   Hypertension, essential    BP: (!) 150/92  Blood pressure is up since off clonidine (given from psychiatry 0.1 mg daily ) Made aware not to stop this med abruptly  He will pick up today and start back  Follow up planned   Takes nsaid- will use with caution  Health habits are better  Intentional weight loss noted       Relevant Orders   TSH (Completed)   Lipid panel (Completed)   Comprehensive metabolic panel (Completed)   CBC with Differential/Platelet (Completed)     Other   Pre-diabetes    A1c ordered Commended better diet and weight  loss disc imp of low glycemic diet and wt loss to prevent DM2       Relevant Orders   Hemoglobin A1c (Completed)   Hyperlipidemia    Labs today  Discuss at follow up Disc goals for lipids and reasons to control them Rev last labs with pt Rev low sat fat diet in detail  Diet is better       Relevant Orders   Lipid panel (Completed)   Class 2 obesity due to excess calories with body mass index (BMI) of 38.0 to 38.9 in adult    Discussed how this problem influences  overall health and the risks it imposes  Reviewed plan for weight loss with lower calorie diet (via better food choices (lower glycemic and portion control) along with exercise building up to or more than 30 minutes 5 days per week including some aerobic activity and strength training   Commended weight loss of almost 60 lb so far       Chronic shoulder pain - Primary    FMLA papers filled out for missed work and accomodations  No changes  Declined more surgery with [facility] for shoulder arthropathy, adhesive capsulitis and supraspinatus high grade tear  Reviewed last ortho note en MR arthrogram report in care everywhere today  Has had numerous surgeries  Goes to PT Sees chiropractor       Other Visit Diagnoses     Need for Tdap vaccination       Relevant Orders   Tdap vaccine greater than or equal to 7yo IM (Completed)

## 2023-02-02 NOTE — Assessment & Plan Note (Signed)
Discussed how this problem influences overall health and the risks it imposes  Reviewed plan for weight loss with lower calorie diet (via better food choices (lower glycemic and portion control) along with exercise building up to or more than 30 minutes 5 days per week including some aerobic activity and strength training   Commended weight loss of almost 60 lb so far

## 2023-02-02 NOTE — Patient Instructions (Signed)
Paperwork today   Tdap vaccine today   Blood pressure is up off your clonidine Start back on it  Labs today   Schedule an annual exam on way out

## 2023-02-02 NOTE — Assessment & Plan Note (Signed)
BP: (!) 150/92  Blood pressure is up since off clonidine (given from psychiatry 0.1 mg daily ) Made aware not to stop this med abruptly  He will pick up today and start back  Follow up planned   Takes nsaid- will use with caution  Health habits are better  Intentional weight loss noted

## 2023-02-02 NOTE — Assessment & Plan Note (Signed)
Labs today  Discuss at follow up Disc goals for lipids and reasons to control them Rev last labs with pt Rev low sat fat diet in detail  Diet is better

## 2023-02-02 NOTE — Assessment & Plan Note (Signed)
A1c ordered Commended better diet and weight loss disc imp of low glycemic diet and wt loss to prevent DM2

## 2023-02-04 NOTE — Telephone Encounter (Signed)
Forms placed in your inbox for review. Please see mychart message

## 2023-02-04 NOTE — Telephone Encounter (Signed)
[employer] [phone] FMLA forms that were submitted question 7 needs an approximate frequency and duration, doesn't have to be exact, but needs estimated amount and the doctor needs to initial and date where the change is made  Could call in and provide the information can take a verbal over the phone, also can be received via fax also  Fax # [phone]

## 2023-02-07 NOTE — Telephone Encounter (Signed)
Made copy, faxed, and sent to scan.

## 2023-02-07 NOTE — Telephone Encounter (Signed)
Done, and in IN box

## 2023-03-01 ENCOUNTER — Encounter: Payer: BC Managed Care – PPO | Admitting: Family Medicine

## 2023-03-01 DIAGNOSIS — R7303 Prediabetes: Secondary | ICD-10-CM

## 2023-03-01 DIAGNOSIS — I10 Essential (primary) hypertension: Secondary | ICD-10-CM

## 2023-03-01 DIAGNOSIS — E7800 Pure hypercholesterolemia, unspecified: Secondary | ICD-10-CM

## 2023-03-02 ENCOUNTER — Encounter: Payer: Self-pay | Admitting: Family Medicine

## 2023-05-30 ENCOUNTER — Encounter: Payer: Self-pay | Admitting: Pulmonary Disease

## 2023-07-29 DIAGNOSIS — F900 Attention-deficit hyperactivity disorder, predominantly inattentive type: Secondary | ICD-10-CM | POA: Diagnosis not present

## 2023-07-29 DIAGNOSIS — F5101 Primary insomnia: Secondary | ICD-10-CM | POA: Diagnosis not present

## 2023-07-29 DIAGNOSIS — F3342 Major depressive disorder, recurrent, in full remission: Secondary | ICD-10-CM | POA: Diagnosis not present

## 2024-02-22 DIAGNOSIS — F3342 Major depressive disorder, recurrent, in full remission: Secondary | ICD-10-CM | POA: Diagnosis not present

## 2024-02-22 DIAGNOSIS — F5101 Primary insomnia: Secondary | ICD-10-CM | POA: Diagnosis not present

## 2024-02-22 DIAGNOSIS — F900 Attention-deficit hyperactivity disorder, predominantly inattentive type: Secondary | ICD-10-CM | POA: Diagnosis not present

## 2024-02-28 ENCOUNTER — Telehealth: Payer: Self-pay

## 2024-02-28 NOTE — Telephone Encounter (Signed)
 Sounds like he needs an appointment.  Thanks

## 2024-02-28 NOTE — Telephone Encounter (Signed)
 Patient has not been seen by provider since 11.20.24 I notified patient that he will need to make an appointment with the provider to go over forms.  Patient acknowledged understanding.  Forms placed in CMA's box until provider can review with patient at future appointment.
